# Patient Record
Sex: Female | Born: 1960
Health system: Southern US, Community
[De-identification: ages and names within clinical notes are randomized; demographics above are authoritative.]

## PROBLEM LIST (undated history)

## (undated) DIAGNOSIS — K449 Diaphragmatic hernia without obstruction or gangrene: Secondary | ICD-10-CM

## (undated) DIAGNOSIS — K2980 Duodenitis without bleeding: Secondary | ICD-10-CM

## (undated) DIAGNOSIS — K219 Gastro-esophageal reflux disease without esophagitis: Secondary | ICD-10-CM

## (undated) DIAGNOSIS — D259 Leiomyoma of uterus, unspecified: Secondary | ICD-10-CM

## (undated) DIAGNOSIS — E039 Hypothyroidism, unspecified: Secondary | ICD-10-CM

## (undated) DIAGNOSIS — K222 Esophageal obstruction: Secondary | ICD-10-CM

## (undated) DIAGNOSIS — R87619 Unspecified abnormal cytological findings in specimens from cervix uteri: Secondary | ICD-10-CM

## (undated) DIAGNOSIS — M797 Fibromyalgia: Secondary | ICD-10-CM

## (undated) DIAGNOSIS — D649 Anemia, unspecified: Secondary | ICD-10-CM

## (undated) DIAGNOSIS — I10 Essential (primary) hypertension: Secondary | ICD-10-CM

## (undated) DIAGNOSIS — J189 Pneumonia, unspecified organism: Secondary | ICD-10-CM

## (undated) DIAGNOSIS — E785 Hyperlipidemia, unspecified: Secondary | ICD-10-CM

## (undated) HISTORY — DX: Anemia, unspecified: D64.9

## (undated) HISTORY — DX: Pneumonia, unspecified organism: J18.9

## (undated) HISTORY — DX: Hyperlipidemia, unspecified: E78.5

## (undated) HISTORY — PX: ABDOMINAL HYSTERECTOMY: SHX81

## (undated) HISTORY — DX: Gastro-esophageal reflux disease without esophagitis: K21.9

## (undated) HISTORY — DX: Hypothyroidism, unspecified: E03.9

## (undated) HISTORY — DX: Leiomyoma of uterus, unspecified: D25.9

## (undated) HISTORY — PX: CHOLECYSTECTOMY: SHX55

## (undated) HISTORY — DX: Duodenitis without bleeding: K29.80

## (undated) HISTORY — DX: Unspecified abnormal cytological findings in specimens from cervix uteri: R87.619

## (undated) HISTORY — DX: Essential (primary) hypertension: I10

## (undated) HISTORY — DX: Esophageal obstruction: K22.2

## (undated) HISTORY — PX: TONSILLECTOMY: SUR1361

## (undated) HISTORY — DX: Diaphragmatic hernia without obstruction or gangrene: K44.9

---

## 1989-11-25 HISTORY — PX: TUBAL LIGATION: SHX77

## 2002-12-29 ENCOUNTER — Other Ambulatory Visit: Admission: RE | Admit: 2002-12-29 | Discharge: 2002-12-29 | Payer: Self-pay | Admitting: Family Medicine

## 2003-10-07 ENCOUNTER — Other Ambulatory Visit: Admission: RE | Admit: 2003-10-07 | Discharge: 2003-10-07 | Payer: Self-pay | Admitting: Obstetrics and Gynecology

## 2004-01-02 ENCOUNTER — Other Ambulatory Visit: Admission: RE | Admit: 2004-01-02 | Discharge: 2004-01-02 | Payer: Self-pay | Admitting: Obstetrics and Gynecology

## 2004-02-24 HISTORY — PX: HYSTEROSCOPY W/ ENDOMETRIAL ABLATION: SUR665

## 2004-02-28 ENCOUNTER — Ambulatory Visit (HOSPITAL_BASED_OUTPATIENT_CLINIC_OR_DEPARTMENT_OTHER): Admission: RE | Admit: 2004-02-28 | Discharge: 2004-02-28 | Payer: Self-pay | Admitting: Obstetrics and Gynecology

## 2004-02-28 ENCOUNTER — Ambulatory Visit (HOSPITAL_COMMUNITY): Admission: RE | Admit: 2004-02-28 | Discharge: 2004-02-28 | Payer: Self-pay | Admitting: Obstetrics and Gynecology

## 2005-01-02 ENCOUNTER — Other Ambulatory Visit: Admission: RE | Admit: 2005-01-02 | Discharge: 2005-01-02 | Payer: Self-pay | Admitting: *Deleted

## 2005-02-23 ENCOUNTER — Emergency Department (HOSPITAL_COMMUNITY): Admission: EM | Admit: 2005-02-23 | Discharge: 2005-02-23 | Payer: Self-pay | Admitting: Emergency Medicine

## 2005-05-25 ENCOUNTER — Emergency Department (HOSPITAL_COMMUNITY): Admission: EM | Admit: 2005-05-25 | Discharge: 2005-05-25 | Payer: Self-pay | Admitting: Emergency Medicine

## 2005-07-13 ENCOUNTER — Emergency Department (HOSPITAL_COMMUNITY): Admission: EM | Admit: 2005-07-13 | Discharge: 2005-07-13 | Payer: Self-pay | Admitting: Emergency Medicine

## 2005-07-31 ENCOUNTER — Encounter: Admission: RE | Admit: 2005-07-31 | Discharge: 2005-07-31 | Payer: Self-pay | Admitting: Gastroenterology

## 2005-11-25 DIAGNOSIS — D649 Anemia, unspecified: Secondary | ICD-10-CM

## 2005-11-25 HISTORY — DX: Anemia, unspecified: D64.9

## 2005-11-25 HISTORY — PX: RECTOCELE REPAIR: SHX761

## 2006-01-10 ENCOUNTER — Other Ambulatory Visit: Admission: RE | Admit: 2006-01-10 | Discharge: 2006-01-10 | Payer: Self-pay | Admitting: Obstetrics & Gynecology

## 2006-03-25 ENCOUNTER — Ambulatory Visit (HOSPITAL_COMMUNITY): Admission: RE | Admit: 2006-03-25 | Discharge: 2006-03-26 | Payer: Self-pay | Admitting: Obstetrics & Gynecology

## 2006-03-25 ENCOUNTER — Encounter (INDEPENDENT_AMBULATORY_CARE_PROVIDER_SITE_OTHER): Payer: Self-pay | Admitting: Specialist

## 2006-03-25 HISTORY — PX: TOTAL VAGINAL HYSTERECTOMY: SHX2548

## 2006-09-11 ENCOUNTER — Encounter: Admission: RE | Admit: 2006-09-11 | Discharge: 2006-09-11 | Payer: Self-pay | Admitting: Interventional Cardiology

## 2006-09-12 ENCOUNTER — Inpatient Hospital Stay (HOSPITAL_BASED_OUTPATIENT_CLINIC_OR_DEPARTMENT_OTHER): Admission: RE | Admit: 2006-09-12 | Discharge: 2006-09-12 | Payer: Self-pay | Admitting: Interventional Cardiology

## 2007-08-04 ENCOUNTER — Ambulatory Visit (HOSPITAL_COMMUNITY): Admission: RE | Admit: 2007-08-04 | Discharge: 2007-08-04 | Payer: Self-pay | Admitting: Internal Medicine

## 2008-01-18 ENCOUNTER — Other Ambulatory Visit: Admission: RE | Admit: 2008-01-18 | Discharge: 2008-01-18 | Payer: Self-pay | Admitting: Obstetrics and Gynecology

## 2008-08-22 ENCOUNTER — Telehealth: Payer: Self-pay | Admitting: Gastroenterology

## 2008-09-01 DIAGNOSIS — K219 Gastro-esophageal reflux disease without esophagitis: Secondary | ICD-10-CM | POA: Insufficient documentation

## 2008-09-01 DIAGNOSIS — E039 Hypothyroidism, unspecified: Secondary | ICD-10-CM

## 2008-09-01 DIAGNOSIS — D259 Leiomyoma of uterus, unspecified: Secondary | ICD-10-CM | POA: Insufficient documentation

## 2008-09-06 ENCOUNTER — Ambulatory Visit: Payer: Self-pay | Admitting: Internal Medicine

## 2008-09-06 DIAGNOSIS — R51 Headache: Secondary | ICD-10-CM

## 2008-09-06 DIAGNOSIS — R519 Headache, unspecified: Secondary | ICD-10-CM | POA: Insufficient documentation

## 2008-09-06 DIAGNOSIS — N390 Urinary tract infection, site not specified: Secondary | ICD-10-CM | POA: Insufficient documentation

## 2008-09-06 DIAGNOSIS — IMO0001 Reserved for inherently not codable concepts without codable children: Secondary | ICD-10-CM

## 2008-09-06 DIAGNOSIS — E785 Hyperlipidemia, unspecified: Secondary | ICD-10-CM | POA: Insufficient documentation

## 2008-09-07 ENCOUNTER — Ambulatory Visit: Payer: Self-pay | Admitting: Internal Medicine

## 2008-09-07 LAB — CONVERTED CEMR LAB: UREASE: NEGATIVE

## 2009-04-18 ENCOUNTER — Telehealth: Payer: Self-pay | Admitting: Internal Medicine

## 2009-04-28 ENCOUNTER — Ambulatory Visit: Payer: Self-pay | Admitting: Internal Medicine

## 2009-10-31 ENCOUNTER — Telehealth: Payer: Self-pay | Admitting: Internal Medicine

## 2010-04-25 ENCOUNTER — Encounter: Payer: Self-pay | Admitting: Internal Medicine

## 2010-12-04 ENCOUNTER — Ambulatory Visit (HOSPITAL_COMMUNITY)
Admission: RE | Admit: 2010-12-04 | Discharge: 2010-12-04 | Payer: Self-pay | Source: Home / Self Care | Attending: Internal Medicine | Admitting: Internal Medicine

## 2010-12-16 ENCOUNTER — Encounter: Payer: Self-pay | Admitting: Internal Medicine

## 2010-12-25 NOTE — Medication Information (Signed)
Summary: Omeprazole/[facility]  Omeprazole/[facility]   Imported By: Sherian Rein 04/30/2010 11:45:36  _____________________________________________________________________  External Attachment:    Type:   Image     Comment:   External Document

## 2011-04-12 NOTE — Op Note (Signed)
NAME:  [name], [name] NO.:  [id number]   MEDICAL RECORD NO.:  [id number]          PATIENT TYPE:  OIB   LOCATION:  9316                          FACILITY:  [facility]   PHYSICIAN:  Martina Sinner, MD DATE OF BIRTH:  December 28, 1960   DATE OF PROCEDURE:  03/25/2006  DATE OF DISCHARGE:                                 OPERATIVE REPORT   SURGEON:  Martina Sinner, M.D.   ASSISTANT:  Lum Keas, M.D.   PREOPERATIVE DIAGNOSIS:  Cystocele, rectocele.   POSTOPERATIVE DIAGNOSIS:  Rectocele.   OPERATION:  Rectocele repair.   Ms. [name] was seen in clinic with asymptomatic cystocele and  rectocele.  Based upon her age and size of the defect, it was felt that it  was in her best interest to repair these at the time of her hysterectomy.   Ms. [name] underwent a transvaginal hysterectomy of a very large uterus by  Dr. Leda Quail.  The cuff was re-supported to the uterosacral ligaments.  On exam of the patient, her anterior vaginal wall and cystocele was nicely  reduced.  She did not require cystocele repair.  She did have some bulging  over the central defect posteriorly.  I did a digital rectal examination by  double gloving the left hand and of note, Dr. Hyacinth Meeker and I, felt that she  would benefit from a rectocele repair.   I initially removed a small diamond of perineal skin.  I made an incision  between Allis clamps with Metzenbaum scissors along the posterior midline to  approximately 2 cm from the posterior cuff which was closed.  I mobilized  the vaginal mucosa from the rectovaginal fascia bilaterally.  A double ring  retractor was utilized.  She had a lot of bleeding throughout the rectocele  repair.  A tonsil had to be used on one bleeder that was oversewn with  figure-of-eight 2-0 Vicryl.  On the right pararectal area, I had to oversew  two veins with 2-0 Vicryl on a CT wand.  I did a one layer closure of the  rectovaginal fascia in the midline.  The  rectovaginal fascia was actually  quite flat after taking down the vaginal mucosa.  There was no obvious site  defect.  A moderate amount of fulguration was utilized to control  hemostasis.  Blood loss during the procedure was approximately 500 mL.  I  trimmed approximately 4-5 mm of vaginal mucosa bilaterally and again the  vaginal mucosa bled.  This was controlled with cautery but also with running  locking 2-0 Vicryl suture.  I used one 0 Vicryl to gently reapproximate the  perineal body.  I then utilized my 2-0 Vicryl to close the perineal diamond  of skin with a subcuticular suture.  Some manual compression was utilized to  control any residual bleeding.  Two 1 inch gauze packs were utilized with  Premarin cream to pack the vagina tightly.  Both Dr. Hyacinth Meeker and I felt that  the vaginal packs would control nicely any residual oozing of blood.   Total estimated blood loss throughout the  case was approximately 700 to 800  mL.  She was given 3 liters of crystalloid.  We are going to watch her CBC  postoperatively as well as tomorrow morning.  Her hemoglobin some  preoperatively was 14.1.  A digital rectal examination was done at the end  of the case and there were no stitches in the bladder and there was no  stitch within the rectum or blood in the rectum.           ______________________________  Martina Sinner, MD  Electronically Signed     SAM/MEDQ  D:  03/25/2006  T:  03/25/2006  Job:  [id number]

## 2011-04-12 NOTE — Op Note (Signed)
NAME:  [name], [name]                        ACCOUNT NO.:  [id number]   MEDICAL RECORD NO.:  [id number]                   PATIENT TYPE:  AMB   LOCATION:  [facility]                                 FACILITY:  [facility]   PHYSICIAN:  Laqueta Linden, M.D.                 DATE OF BIRTH:  1961-01-27   DATE OF PROCEDURE:  02/28/2004  DATE OF DISCHARGE:                                 OPERATIVE REPORT   PREOPERATIVE DIAGNOSES:  Menorrhagia with anemia.   POSTOPERATIVE DIAGNOSES:  Menorrhagia with anemia.   PROCEDURE:  Hydrothermal ablation.   SURGEON:  Laqueta Linden, M.D.   ANESTHESIA:  General LMA.   ESTIMATED BLOOD LOSS:  Negligible.   SORBITOL NET INTAKE:  Negligible.   COMPLICATIONS:  None.   INDICATIONS FOR PROCEDURE:  [name] is a 50 year old, gravida 3, para  3 female who presents with disabling menorrhagia with anemia and a  hemoglobin of 9.3.  She was noted on exam to have a large fundal fibroid.  Pelvic ultrasound with sonohysterogram confirmed a 6 cm fundal fibroid that  appeared to be intramural in location. Both ovaries appeared normal. There  were no other fibroids or lesions noted.  The endometrial cavity was free of  any lesions including fibroids or polyps.  Endometrial sampling revealed  benign proliferative endometrium. Various management options were discussed  with the patient including medical management with oral contraceptive  progesterone only pills or a Marina IUD versus surgical options including  the ablation versus vaginal hysterectomy. The patient is getting married in  a couple of months and wanted the most conservative surgical procedure and  therefore elected to proceed to ablation.  She has seen the informed consent  film, voiced her understanding of the risks, benefits, alternatives,  complications and limitations of the procedure as well as the likelihood  that she will not obtain optimal relief of her symptoms by the time of her  wedding in two  months time.  She understands the risk of uterine  perforation, bleeding, incomplete results, burn of the vagina and cervix as  __________ and agrees to proceed.   DESCRIPTION OF PROCEDURE:  The patient was taken to the operating room and  after proper identification and consents were ascertained, she was placed on  the operating table in the supine position.  After the induction of general  LMA, she was placed in the [place] stirrups and the perineum and vagina were  prepped and draped in routine sterile fashion.  The bladder was emptied with  a red rubber catheter.  Bimanual examination confirmed an anterior mobile  uterus with a large fundal fibroid. A speculum was placed in the vagina and  the cervix was grasped with a single tooth tenaculum. The internal os was  gently dilated to a #21 Pratt dilator. The short scope was then inserted  with continuous closed circuit infusion of sorbitol. The uterine cavity  appeared  clean and atrophic. There were no focal lesions, both tubal ostia  were visualized.  The hydrothermal ablation was then accomplished in a  routine fashion with checking for appropriate pressure levels and assessing  for any perforations or leak, heating the fluid and then an ablation  procedure of 10 minutes followed by cool down. This was all accomplished  without difficulty. There was no obvious leakage either on the monitor or by  direct visualization of the cervix.  Photographs of pre and postoperative  findings were then taken. The scope was withdrawn. The tenaculum site was  hemostatic. There was no bleeding from the cervix.  The patient received 30  mg of Toradol IV, 30 mg IM at the onset of the procedure. She will be  observed and discharged per anesthesia protocol.  She will be discharged  home with family and told to take Advil or Aleve as needed for cramping. She  is to followup in the office in 2-3 weeks time or sooner for excessive pain,  fever, bleeding or other  concerns.                                               Laqueta Linden, M.D.    LKS/MEDQ  D:  02/28/2004  T:  02/28/2004  Job:  [id number]

## 2011-04-12 NOTE — Discharge Summary (Signed)
NAME:  [name], [name]                 ACCOUNT NO.:  [id number]   MEDICAL RECORD NO.:  [id number]          PATIENT TYPE:  OIB   LOCATION:  9316                          FACILITY:  [facility]   PHYSICIAN:  M. Leda Quail, MD  DATE OF BIRTH:  1961/11/06   DATE OF ADMISSION:  03/25/2006  DATE OF DISCHARGE:  03/26/2006                                 DISCHARGE SUMMARY   ADMISSION DIAGNOSES:  28.  A 50 year old, G3, P3, married, white female with menorrhagia.  2.  Failed hydrothermal ablation.  3.  Fibroid uterus which is enlarging on ultrasound.  4.  History of right bundle branch block.  5.  Reflux disease.  6.  Hypothyroidism.  7.  Pelvic relaxation.   DISCHARGE DIAGNOSES:  42.  A 50 year old, G3, P3, married, white female with menorrhagia.  2.  Failed hydrothermal ablation.  3.  Fibroid uterus which is enlarging on ultrasound.  4.  History of right bundle branch block.  5.  Reflux disease.  6.  Hypothyroidism.  7.  Pelvic relaxation.   PROCEDURE:  Transvaginal hysterectomy with a posterior repair performed by  Dr. Sherron Monday of urology.   HISTORY OF PRESENT ILLNESS:  Written H&P is in the chart, but in brief, Ms.  [name] is a 50 year old, G3, P3, married, white female with menorrhagia who  has a fibroid uterus and HTA in April 2005.  She did have improvement in her  bleeding, but ultimately, the fibroids continued to grow and she has had  worsening bleeding.  Her cycles are heavy with clots.  She has had a history  of associated iron deficiency anemia, but this is normal with the iron  supplementation and multivitamin.   PHYSICAL EXAMINATION:  GENITALIA:  Significant rectocele and cystocele.  She  has seen Dr. Alfredo Martinez.  She does not have any evidence of stress  urinary incontinence.  As a result, possible anterior and posterior repair  has been posted by Dr. Sherron Monday.   HOSPITAL COURSE:  The patient was admitted through same day surgery.  She  was taken to the operating  room where a TVH and rectocele repair was  performed.  The patient did have approximately 700 mL of blood loss during  this surgery, 300 of this was during the [facility] and the remainder was during  the posterior repair.  The patient did tolerate the procedure well and she  was very stable throughout the procedure.  She was taken to the recovery  room after her surgery and then to the third floor for her postop course.  In the evening of postop day #0, she is awake and alert.  She was talking  and had good pain control.  She was not nauseated and was tolerating a  regular diet.  Her postop hemoglobin was checked in the morning of postop  day #1 and this was appropriate decrease from surgery.  It was 9.9 which was  felt appropriate based on her blood loss.  She had stable vital signs  throughout her  hospitalization.  She was afebrile.  The morning of day #1, she was  voiding  without difficulty, ambulating with excellent pain control and tolerating a  regular diet.  Discharge was felt appropriate.  She was discharged home in  stable condition with her husband.      Lum Keas, MD  Electronically Signed     MSM/MEDQ  D:  04/15/2006  T:  04/15/2006  Job:  [id number]

## 2011-04-12 NOTE — Cardiovascular Report (Signed)
NAME:  [name], HERST NO.:  [id number]   MEDICAL RECORD NO.:  [id number]          PATIENT TYPE:  OIB   LOCATION:  1963                         FACILITY:  [facility]   PHYSICIAN:  Lyn Records, M.D.   DATE OF BIRTH:  [birth year]   DATE OF PROCEDURE:  DATE OF DISCHARGE:                              CARDIAC CATHETERIZATION   INDICATIONS:  The patient has an abnormal EKG with a right bundle branch  block and has had recurring episodes of chest discomfort over the last year  or so.  Chest discomfort has changed in quality and has taken on a more  pressure-like discomfort.  A Cardiolite study done a year and half ago was  normal.  This study is being done to define coronary anatomy and rule out  the possibility of matched ischemia.   PROCEDURE PERFORMED:  1. Left heart cath.  2. Selective coronary angio.  3. Left ventriculography.   DESCRIPTION:  After informed consent, a 4-French sheath was placed in the  right femoral artery using the modified Seldinger technique.  A 4-French A2  multipurpose catheter was then used for hemodynamic recordings, and left  ventriculography by hand injection.  We then used a 4-French, #4 left  Judkins, and #4 right Judkins catheters for selective coronary angiography.  Intracoronary nitroglycerin 200 mcg was administered into the left main.  Following completion of angiography, the case was terminated.  The cine  digital images were reviewed with the patient and the sheath was removed to  achieve hemostasis by manual compression.  No complications occurred.   RESULTS:  1. Hemodynamic data:      a.     Aortic pressure 127/82-mmHg.      b.     Left ventricular pressure 127/-1-mmHg.  2. Left ventriculography:  The left ventricle is normal in size and      demonstrates normal contractility.  EF 65% or minimal.  3. Coronary angiography:      a.     Left main coronary:  Widely patent.      b.     The left anterior descending coronary:   Minimal luminal       irregularities are noted in the proximal vessel and the first septal       perforator.  The vessels are widely patent..      c.     The circumflex artery:  Normal, two small distal obtuse marginal       branches arise from it.      d.     The ramus intermedius branch:  A ramus intermedius branch arises       from the distal left main and is also free of any significant       obstruction.Marland Kitchen      e.     The right coronary:  The right coronary artery is a large,       dominant vessel, very smooth without any evidence of irregularity.  A       large PDA and two left ventricular branches arise from it distally.   CONCLUSIONS:  1. Essentially normal coronary arteries.  2. Normal left ventricular function.  3. Chest pain not likely to be related to myocardial ischemia.   RECOMMENDATIONS:  Pursue other explanations for chest discomfort, continue  aggressive risk factor modification.      Lyn Records, M.D.  Electronically Signed     HWS/MEDQ  D:  09/12/2006  T:  09/13/2006  Job:  [id number]   cc:   Lovenia Kim, D.O.

## 2011-04-12 NOTE — Op Note (Signed)
NAME:  [name], [name]                 ACCOUNT NO.:  [id number]   MEDICAL RECORD NO.:  [id number]          PATIENT TYPE:  OIB   LOCATION:  9316                          FACILITY:  [facility]   PHYSICIAN:  M. Leda Quail, MD  DATE OF BIRTH:  [birth year]   DATE OF PROCEDURE:  03/25/2006  DATE OF DISCHARGE:                                 OPERATIVE REPORT   PREOPERATIVE DIAGNOSES:  37.  A 50 year old G3 P3 married white female with menorrhagia.  2.  Fibroid uterus, with an enlarging fibroid on ultrasound measuring 6.3 x      7.4 cm.  3.  History of hydrothermal ablation which has failed.  4.  History of hypothyroidism.  5.  Reflux.  6.  Cystocele and rectocele.   POSTOPERATIVE DIAGNOSES:  35.  A 50 year old G3 P3 married white female with menorrhagia.  2.  Fibroid uterus, with an enlarging fibroid on ultrasound measuring 6.3 x      7.4 cm.  3.  History of hydrothermal ablation which has failed.  4.  History of hypothyroidism.  5.  Reflux.  6.  Cystocele and rectocele.   PROCEDURE:  TVH and a posterior repair performed.  The second part of the  procedure was performed by Dr. Alfredo Martinez.   SURGEON:  M. Leda Quail, M.D.   ASSISTANT:  Edwena Felty. Romine, M.D.   FLUIDS:  3000 cc of LR.   URINE OUTPUT:  220 cc of clear urine.   ESTIMATED BLOOD LOSS:  700 cc, approximately 350 for the vaginal  hysterectomy, and 350 for the posterior repair.   SPECIMENS:  Uterus and cervix.   ANESTHESIA:  General endotracheal anesthesia.   COMPLICATIONS:  None for the vaginal hysterectomy portion of the procedure.   INDICATIONS:  [name] is a very pleasant 50 year old G3 P3 married white  female with menorrhagia and a fibroid uterus.  She does have a history of  fibroids, and an ultrasound was done earlier this year which showed a much  enlarged fundal fibroid.  This measured 6.3 x 7.4 cm.  She did have an HTA  in 1995 with initial success but her bleeding has worsened over the last  year.   She has difficulty with oral contraceptives.  She and I discussed  either myomectomy or proceeding with definitive management with  hysterectomy, and she has opted for hysterectomy.   PROCEDURE:  The patient was taken to the operating room.  She was placed in  the supine position.  General endotracheal anesthesia was administered by  the anesthesia staff without difficulty.  Legs were positioned initially in  the low lithotomy position in the [place] stirrups.  The Allen stirrups were  raised to the high lithotomy position.  Abdomen, perineum, inner thighs, and  vagina were prepped and draped in the normal sterile fashion.  Red rubber  Foley catheter proceeded to drain the bladder of all urine.   A short heavy weighted speculum was placed in the posterior aspect of the  vagina.  The cervix was grasped with a Jackson tenaculum.  The posterior cul-  de-sac was  entered sharply and without difficulty.  A figure-of-eight suture  was placed through the posterior peritoneum and incorporated the posterior  vaginal mucosa.  A long heavy weighted retractor was then placed through  this incision.  The cervix at this point was instilled with 10 cc of 1%  lidocaine mixed 1:1 with epinephrine (1:100,000 units).  The cervix was  circumferentially incised with cautery.  Bovie on a 35/35 cut/coag setting  was used.  At this point, the uterosacral ligaments on the left and right  side were clamped.  These were cut and stitched with Heaney stitches.  The  sutures were left long.  Attention was turned anteriorly.  The  pubovesicocervical fascia was dissected sharply with Metzenbaum scissors.  Then, with blunt dissection of an open Ray-Tec, the bladder was advanced  superiorly on the cervix.  At this point, the beginning of the cardinal  ligaments were clamped, transected, and suture ligated with #0 Vicryl on  both the left and right sides.  Attention was then turned anteriorly.  Again, the pubovesicocervical  fascia was dissected further.  The reflection  of the anterior peritoneum was noted.  This was incised sharply with  Metzenbaum scissors.  Curved Dever retractor was placed in this incision and  bowel visualized to insure the operator was in the correct pla.  Then the  Cardinal ligaments and uterine arteries were serially clamped, transected,  and suture ligated with #0 Vicryl.  Clamps were placed medial to previous  clamps.  This was performed on the left and the right side up the cervix to  the level of the internal os of cervix.  At this point, it was difficult to  do any further clamping on the patient's left side due to the location of  the fibroid and the position of the uterus.  However, further dissection up  the broad ligament on the right side could be performed.  Two more clamps  were placed which were serially transected and suture ligated with #0  Vicryl.  At this point, the operator's index finger could almost be placed  around the uteroovarian ligament.  An attempt was made to deliver the  posterior aspect of the uterus through the incision, but due to the fibroid  this was impossible.  At this point, the decision was made to go ahead and  morcellize the uterus.  The cervix was cored from the midline around to the  patient's left and then posteriorly, and then around to the patient's right.  Once the cervix was cored free, several smaller fibroids could be removed  from inside the myometrium.  A wedge of the uterus posteriorly was then  removed with a blade with a long handle.  At this point, the right aspect of  the uterus could be pulled further into the visual field, and the right  uteroovarian ligament could be clamped with a Heaney clamp.  This was  clamped fully.  This pedicle was transected, suture ligated first with a  free tie of #0 Vicryl, and second with a Heaney suture of #0 Vicryl.  The suture was left long and grasped with a curved hemostat.  At this point, the   location of the fibroid was noted.  The uterus was incised until the fibroid  was noted.  The fibroid was then cored out by cutting in the capsule.  Once  the large fundal fibroid was removed, the uterus could be delivered through  the incision.  The uterus was then clamped on  the left side, across the  uteroovarian ligament with a Heaney clamped.  The uterus was then transected  off.  Two free ties of #0 Vicryl were placed around this pedicle, and then a  Heaney suture of #0 Vicryl was placed.  This pedicle was hemostatic.  It was  kept long and put in a curved hemostat.  At this point, the pedicles were  inspected, particularly the uteroovarian bilaterally.  It was very difficult  to pull the ovaries any farther into the visual field.  The patient and I  had discussed possibly removing one of the ovaries.  However, because of the  difficulty, the decision was made to go ahead and leave it in place.  It  could be seen and did appear normal.  At this point, the cardinal ligaments  and the uterine artery pedicles were inspected.  No bleeding was noted.  There was some bleeding off of the posterior cuff of the vaginal mucosa.  The long heavy weighted retractor was removed, and a short weighted one was  placed in the posterior aspect of the vagina.  At this point, the cuff was  run with the #0 Vicryl.  This incorporated the anterior and posterior  peritoneum in this closure.  Then, another long #0 Vicryl was placed through  the posterior vaginal mucosa to placed an enterocele stitch.  This stitch  was placed through the posterior vaginal mucosa and brought out through the  posterior peritoneum.  Then, the medial third of the uterosacral ligament  was incorporated.  The posterior peritoneum was wreathed across and  incorporated into this stitch, and then finally the opposite uterosacral  ligament was incorporated.  The medial third of this ligament was  incorporated, and this stitch was brought  back to the posterior vaginal  peritoneum and mucosa.  The stitch was tied tight, and the uterosacral  ligaments were brought together.  At this point, the remainder of the  vaginal cuff was closed with four interrupted sutures of #0 Vicryl.  Figure-  of-eight sutures were used.  The vagina was irrigated.  No bleeding was  noted at this point.  There was approximately 350 cc of blood loss from this  portion of the procedure, and a Foley catheter was placed in the bladder and  clamped, and approximately 80 cc of clear urine was noted.  Sponge, lap,  needle, and instrument counts were correct at this point in the procedure.  Dr. Sherron Monday was available at this time, and the remainder of the  dictation will be performed by him.      Lum Keas, MD  Electronically Signed     MSM/MEDQ  D:  03/25/2006  T:  03/25/2006  Job:  [id number]

## 2011-08-25 ENCOUNTER — Other Ambulatory Visit: Payer: Self-pay | Admitting: Internal Medicine

## 2011-11-13 ENCOUNTER — Other Ambulatory Visit: Payer: Self-pay | Admitting: Internal Medicine

## 2011-11-13 DIAGNOSIS — R109 Unspecified abdominal pain: Secondary | ICD-10-CM

## 2011-11-13 DIAGNOSIS — M4800 Spinal stenosis, site unspecified: Secondary | ICD-10-CM

## 2011-11-20 ENCOUNTER — Other Ambulatory Visit: Payer: Self-pay

## 2011-11-20 ENCOUNTER — Ambulatory Visit
Admission: RE | Admit: 2011-11-20 | Discharge: 2011-11-20 | Disposition: A | Discharge status: Home / Self Care | Payer: 59 | Source: Ambulatory Visit | Attending: Internal Medicine | Admitting: Internal Medicine

## 2011-11-20 DIAGNOSIS — M4800 Spinal stenosis, site unspecified: Secondary | ICD-10-CM

## 2011-11-21 ENCOUNTER — Ambulatory Visit
Admission: RE | Admit: 2011-11-21 | Discharge: 2011-11-21 | Disposition: A | Discharge status: Home / Self Care | Payer: 59 | Source: Ambulatory Visit | Attending: Internal Medicine | Admitting: Internal Medicine

## 2011-11-21 DIAGNOSIS — R109 Unspecified abdominal pain: Secondary | ICD-10-CM

## 2011-12-04 ENCOUNTER — Other Ambulatory Visit: Payer: Self-pay | Admitting: Internal Medicine

## 2011-12-10 ENCOUNTER — Ambulatory Visit
Admission: RE | Admit: 2011-12-10 | Discharge: 2011-12-10 | Disposition: A | Discharge status: Home / Self Care | Payer: 59 | Source: Ambulatory Visit | Attending: Internal Medicine | Admitting: Internal Medicine

## 2011-12-25 ENCOUNTER — Other Ambulatory Visit (INDEPENDENT_AMBULATORY_CARE_PROVIDER_SITE_OTHER): Payer: 59

## 2011-12-25 ENCOUNTER — Ambulatory Visit (INDEPENDENT_AMBULATORY_CARE_PROVIDER_SITE_OTHER): Payer: 59 | Admitting: Internal Medicine

## 2011-12-25 ENCOUNTER — Encounter: Payer: Self-pay | Admitting: Internal Medicine

## 2011-12-25 ENCOUNTER — Telehealth: Payer: Self-pay

## 2011-12-25 VITALS — BP 132/64 | HR 80 | Ht 65.0 in | Wt 198.0 lb

## 2011-12-25 DIAGNOSIS — R1011 Right upper quadrant pain: Secondary | ICD-10-CM

## 2011-12-25 DIAGNOSIS — Z1211 Encounter for screening for malignant neoplasm of colon: Secondary | ICD-10-CM

## 2011-12-25 DIAGNOSIS — K219 Gastro-esophageal reflux disease without esophagitis: Secondary | ICD-10-CM

## 2011-12-25 LAB — HEPATIC FUNCTION PANEL
ALT: 33 U/L (ref 0–35)
AST: 30 U/L (ref 0–37)
Bilirubin, Direct: 0.1 mg/dL (ref 0.0–0.3)
Total Bilirubin: 0.3 mg/dL (ref 0.3–1.2)

## 2011-12-25 LAB — LIPASE: Lipase: 25 U/L (ref 11.0–59.0)

## 2011-12-25 LAB — AMYLASE: Amylase: 44 U/L (ref 27–131)

## 2011-12-25 MED ORDER — PEG-KCL-NACL-NASULF-NA ASC-C 100 G PO SOLR: 1.0000 | Freq: Once | ORAL | Status: DC

## 2011-12-25 NOTE — Progress Notes (Signed)
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 She has been seen in this office previously for GERD. The patient reports a greater than one-year history of upper abdominal pressure discomfort. This is most noticeable in the right though can occur across the entire upper abdomen. The discomfort is not constant or severe. It seems to be exacerbated by meals on occasion. No nocturnal component. She states it reminds her of "gallbladder pain" though she is status post remote cholecystectomy. Review of outside radiology findings abdominal ultrasound from 11/21/2011 with no abnormalities post cholecystectomy. Subsequent CT scan of the abdomen and pelvis, without contrast, dated 12/10/2011 was unremarkable. No nausea or vomiting. Steady weight gain of 13 pounds since her last office visit in 2010. No problems with her bowels. No GI bleeding. The discomfort is also exacerbated with sitting or bending forward and improved with standing or leaning backward. Reflux symptoms are under good control except with dietary indiscretion. She continues on twice a day omeprazole 40 mg. No dysphagia. She inquires about screening colonoscopy, which she is due this year.  REVIEW OF SYSTEMS:  All non-GI ROS negative except for insomnia and urinary leakage  Past Medical History  Diagnosis Date  . Hypothyroidism   . GERD (gastroesophageal reflux disease)   . Leiomyoma of uterus, unspecified     fibroids  . Esophagitis   . Hiatal hernia   . Esophageal stricture   . Duodenitis without hemorrhage     Past Surgical History  Procedure Date  . Abdominal hysterectomy   . Cholecystectomy   . Rectocele repair     Social History GARLAND SMOUSE  reports that she quit smoking about 9 years ago. She has never used smokeless tobacco. She reports that she  does not drink alcohol or use illicit drugs.  family history includes Breast cancer in an unspecified family member; Celiac disease in her mother; Heart disease in her father; and Irritable bowel syndrome in her sister.  There is no history of Colon cancer.  No Known Allergies     PHYSICAL EXAMINATION: Vital signs: BP 132/64  Pulse 80  Ht 5\' 5"  (1.651 m)  Wt 198 lb (89.812 kg)  BMI 32.95 kg/m2  Constitutional: generally well-appearing, no acute distress Psychiatric: alert and oriented x3, cooperative Eyes: extraocular movements intact, anicteric, conjunctiva pink Mouth: oral pharynx moist, no lesions Neck: supple no lymphadenopathy Cardiovascular: heart regular rate and rhythm, no murmur Lungs: clear to auscultation bilaterally Abdomen: soft, nontender, nondistended, no obvious ascites, no peritoneal signs, normal bowel sounds, no organomegaly Rectal:deferred until colonoscopy Extremities: no lower extremity edema bilaterally Skin: no lesions on visible extremities Neuro: No focal deficits. No asterixis.     ASSESSMENT:  #1. Greater than one-year history of upper abdominal discomfort as described. No radiologic abnormalities. Possibly musculoskeletal. Less likely common duct stone. Rule out upper GI mucosal lesion. #2. GERD. Chronic. Fairly well controlled with twice a day PPI. Breakthrough with dietary indiscretion #3. Screening colonoscopy. Appropriate candidate without contraindication  PLAN:  #1. Reflux precautions #2. Continue PPI #3. Diagnostic upper endoscopy.The nature of the procedure, as well as the risks, benefits, and alternatives were carefully and thoroughly reviewed with the patient. Ample time for discussion and questions allowed. The patient understood, was satisfied, and agreed to proceed.  #4. Her function tests, amylase, lipase #5. Screening colonoscopy.The nature of the procedure, as  well as the risks, benefits, and alternatives were carefully and  thoroughly reviewed with the patient. Ample time for discussion and questions allowed. The patient understood, was satisfied, and agreed to proceed. Movi prep prescribed. The patient instructed on its use. We will perform the examination the same day for upper endoscopy, for her convenience

## 2011-12-25 NOTE — Telephone Encounter (Signed)
Message copied by Michele Mcalpine on Wed Dec 25, 2011  4:55 PM ------      Message from: Hilarie Fredrickson      Created: Wed Dec 25, 2011  2:24 PM       Let pt know that her blood work is normal. Keep other plans

## 2011-12-25 NOTE — Patient Instructions (Signed)
Your physician has requested that you go to the basement for  lab work before leaving today   You have been scheduled for an endoscopy and colonoscopy with propofol. Please follow the written instructions given to you at your visit today. Please pick up yourprep at the pharmacy within the next 2-3 days.

## 2011-12-26 NOTE — Telephone Encounter (Signed)
Pt aware.

## 2012-01-09 ENCOUNTER — Ambulatory Visit (AMBULATORY_SURGERY_CENTER): Payer: 59 | Admitting: Internal Medicine

## 2012-01-09 ENCOUNTER — Encounter: Payer: Self-pay | Admitting: Internal Medicine

## 2012-01-09 VITALS — BP 130/94 | HR 91 | Temp 98.0°F | Resp 19 | Ht 65.0 in | Wt 198.0 lb

## 2012-01-09 DIAGNOSIS — D126 Benign neoplasm of colon, unspecified: Secondary | ICD-10-CM

## 2012-01-09 DIAGNOSIS — R1011 Right upper quadrant pain: Secondary | ICD-10-CM

## 2012-01-09 DIAGNOSIS — K219 Gastro-esophageal reflux disease without esophagitis: Secondary | ICD-10-CM

## 2012-01-09 DIAGNOSIS — Z1211 Encounter for screening for malignant neoplasm of colon: Secondary | ICD-10-CM

## 2012-01-09 MED ORDER — SODIUM CHLORIDE 0.9 % IV SOLN: 500.0000 mL | INTRAVENOUS | Status: DC

## 2012-01-09 NOTE — Patient Instructions (Signed)
Discharge instructions given with verbal understanding. Handouts on polyps,hiatal hernia and gerd given. Resume previous medications.

## 2012-01-09 NOTE — Op Note (Signed)
 [facility] [address] [place], [place]  [address]  COLONOSCOPY PROCEDURE REPORT  PATIENT:  [name], [name]  MR#:  [id number] BIRTHDATE:  May 03, 1961, 50 yrs. old  GENDER:  female ENDOSCOPIST:  Wilhemina Bonito. Eda Keys, MD REF. BY:  Office PROCEDURE DATE:  01/09/2012 PROCEDURE:  Colonoscopy with snare polypectomy x 1 ASA CLASS:  Class II INDICATIONS:  Routine Risk Screening MEDICATIONS:   MAC sedation, administered by CRNA, propofol (Diprivan) 400 mg IV  DESCRIPTION OF PROCEDURE:   After the risks benefits and alternatives of the procedure were thoroughly explained, informed consent was obtained.  Digital rectal exam was performed and revealed no abnormalities.   The LB 180AL [id number] endoscope was introduced through the anus and advanced to the cecum, which was identified by both the appendix and ileocecal valve, without limitations.  The quality of the prep was excellent, using MoviPrep.  The instrument was then slowly withdrawn as the colon was fully examined. <<PROCEDUREIMAGES>>  FINDINGS:  A 5mm sessile polyp was found in the proximal transverse colon and snared without cautery. Retrieval was successful.  Otherwise normal colonoscopy without other polyps, masses, vascular ectasias, or inflammatory changes.   Retroflexed views in the rectum revealed internal hemorrhoids.    The time to cecum = 2:30  minutes. The scope was then withdrawn in 12:03 minutes from the cecum and the procedure completed.  COMPLICATIONS:  None  ENDOSCOPIC IMPRESSION: 1) Sessile polyp in the proximal transverse colon - removed 2) Otherwise normal colonoscopy  RECOMMENDATIONS: 1) Repeat colonoscopy in 5 years if polyp adenomatous; otherwise 10 years 2) Upper endoscopy  today  ______________________________ Wilhemina Bonito. Eda Keys, MD  CC:  Rodrigo Ran, MD;  The Patient  n. eSIGNED:   Wilhemina Bonito. Eda Keys at 01/09/2012 08:51 AM  Midge Aver, [phone]

## 2012-01-09 NOTE — Progress Notes (Signed)
Patient did not experience any of the following events: a burn prior to discharge; a fall within the facility; wrong site/side/patient/procedure/implant event; or a hospital transfer or hospital admission upon discharge from the facility. (G8907) Patient did not have preoperative order for IV antibiotic SSI prophylaxis. (G8918)  

## 2012-01-09 NOTE — Op Note (Signed)
[facility] [address] [place], [place]  [address]  ENDOSCOPY PROCEDURE REPORT  PATIENT:  Brooke, [name]  MR#:  [id number] BIRTHDATE:  1960-12-10, 50 yrs. old  GENDER:  female  ENDOSCOPIST:  Wilhemina Bonito. Eda Keys, MD Referred by:  Office  PROCEDURE DATE:  01/09/2012 PROCEDURE:  EGD, diagnostic 93235 ASA CLASS:  Class II INDICATIONS:  abdominal pain, upper and right upper quadrant  MEDICATIONS:   MAC sedation, administered by CRNA, propofol (Diprivan) 120 mg IV TOPICAL ANESTHETIC:  none  DESCRIPTION OF PROCEDURE:   After the risks benefits and alternatives of the procedure were thoroughly explained, informed consent was obtained.  The LB GIF-H180 [id number] endoscope was introduced through the mouth and advanced to the second portion of the duodenum, without limitations.  The instrument was slowly withdrawn as the mucosa was fully examined. <<PROCEDUREIMAGES>>  The upper, middle, and distal third of the esophagus were carefully inspected and no abnormalities were noted. The z-line was well seen at the GEJ. The endoscope was pushed into the fundus which was normal including a retroflexed view. The antrum,gastric body, first and second part of the duodenum were unremarkable. Retroflexed views revealed a hiatal hernia.    The scope was then withdrawn from the patient and the procedure completed.  COMPLICATIONS:  None  ENDOSCOPIC IMPRESSION: 1) Normal EGD 2) A hiatal hernia 3) GERD 4) NO GI CAUSE FOR PAIN FOUND OR SUSPECTED. PAIN IS LIKELY MUSCULOSKELETAL  RECOMMENDATIONS: 1) Continue omeprazole to control reflux symptoms  ______________________________ Wilhemina Bonito. Eda Keys, MD  CC:  Rodrigo Ran, MD;  The Patient  n. eSIGNED:   Wilhemina Bonito. Eda Keys at 01/09/2012 09:00 AM  Midge Aver, [phone]

## 2012-01-10 ENCOUNTER — Telehealth: Payer: Self-pay | Admitting: *Deleted

## 2012-01-10 NOTE — Telephone Encounter (Signed)
  Follow up Call-  Call back number 01/09/2012  Post procedure Call Back phone  # [phone]  Permission to leave phone message Yes     Patient questions:  Do you have a fever, pain , or abdominal swelling? no Pain Score  0 *  Have you tolerated food without any problems? yes  Have you been able to return to your normal activities? yes  Do you have any questions about your discharge instructions: Diet   no Medications  no Follow up visit  no  Do you have questions or concerns about your Care? no  Actions: * If pain score is 4 or above: No action needed, pain <4.

## 2012-01-14 ENCOUNTER — Encounter: Payer: Self-pay | Admitting: Internal Medicine

## 2012-05-18 ENCOUNTER — Other Ambulatory Visit: Payer: Self-pay | Admitting: Internal Medicine

## 2012-07-14 LAB — HM MAMMOGRAPHY

## 2012-09-14 ENCOUNTER — Other Ambulatory Visit: Payer: Self-pay

## 2012-09-14 MED ORDER — OMEPRAZOLE 40 MG PO CPDR: 40.0000 mg | DELAYED_RELEASE_CAPSULE | Freq: Two times a day (BID) | ORAL | Status: DC

## 2012-09-16 ENCOUNTER — Encounter (INDEPENDENT_AMBULATORY_CARE_PROVIDER_SITE_OTHER): Payer: Self-pay | Admitting: Surgery

## 2012-09-16 ENCOUNTER — Ambulatory Visit (INDEPENDENT_AMBULATORY_CARE_PROVIDER_SITE_OTHER): Payer: 59 | Admitting: Surgery

## 2012-09-16 NOTE — Progress Notes (Signed)
   Re:   [name] DOB:   [birth year] MRN:   [id number]  Patient's weight < 35 BMI.  She is going to check with her insurance about coverage.  No charge for today.  ASSESSMENT AND PLAN: 1.  Morbid obesity  2.  Thyroid replacement 3. GERD/hiatal hernia  CT scan - 12/04/2011 - shows HH 4.  Serrated adenoma - colonoscopy - 01/09/2012 - J. Marina Goodell. 5.  Hypertension 6.  Hyperlipidemia   Chief Complaint  Patient presents with  . Bariatric Pre-op    Initial   REFERRING PHYSICIAN: Ezequiel Kayser, MD  HISTORY OF PRESENT ILLNESS: [name] is a 51 y.o. (DOB: 02/24/1961)  white female whose primary care physician is Ezequiel Kayser, MD and comes to me today for bariatric surgery.    Past Medical History  Diagnosis Date  . Hypothyroidism   . GERD (gastroesophageal reflux disease)   . Leiomyoma of uterus, unspecified     fibroids  . Esophagitis   . Hiatal hernia   . Esophageal stricture   . Duodenitis without hemorrhage   . Hyperlipidemia   . Hypertension       Past Surgical History  Procedure Date  . Abdominal hysterectomy   . Cholecystectomy   . Rectocele repair   . Upper gastrointestinal endoscopy   . Tubal ligation     BILATERAL  . Hysteroscopy w/ endometrial ablation   . Tonsillectomy       Current Outpatient Prescriptions  Medication Sig Dispense Refill  . aspirin 81 MG tablet Take 81 mg by mouth daily.      . CENESTIN 0.9 MG tablet       . cyclobenzaprine (FLEXERIL) 5 MG tablet Take 5 mg by mouth at bedtime as needed.      . DULoxetine (CYMBALTA) 30 MG capsule Take 30 mg by mouth daily.      Marland Kitchen levothyroxine (SYNTHROID, LEVOTHROID) 75 MCG tablet Take 75 mcg by mouth daily.      Marland Kitchen losartan (COZAAR) 100 MG tablet Take 100 mg by mouth daily.      . Multiple Vitamin (MULTIVITAMIN) capsule Take 1 capsule by mouth daily.      Marland Kitchen omeprazole (PRILOSEC) 40 MG capsule Take 1 capsule (40 mg total) by mouth 2 (two) times daily.  180 capsule  3  . pramipexole (MIRAPEX)  0.125 MG tablet       . rosuvastatin (CRESTOR) 10 MG tablet Take 10 mg by mouth daily.      Marland Kitchen DISCONTD: omeprazole (PRILOSEC) 20 MG capsule TAKE 2 CAPSULES EVERY MORNING (AND 2 CAPSULES EVERY EVENING OCCASIONALLY)  360 capsule  1     No Known Allergies  PHYSICAL EXAM: BP 128/80  Pulse 92  Temp 97 F (36.1 C) (Temporal)  Resp 16  Ht 5\' 5"  (1.651 m)  Wt 199 lb 9.6 oz (90.538 kg)  BMI 33.22 kg/m2  Not done  DATA REVIEWED: Reviewed Epic.  Ovidio Kin, MD,  [facility] [address]  [address]   [place], [place]    [address] Phone:  [phone] FAX:  [phone]

## 2012-11-25 HISTORY — PX: CARPAL TUNNEL RELEASE: SHX101

## 2013-04-15 ENCOUNTER — Encounter: Payer: Self-pay | Admitting: Obstetrics & Gynecology

## 2013-05-04 ENCOUNTER — Telehealth: Payer: Self-pay | Admitting: *Deleted

## 2013-05-04 NOTE — Telephone Encounter (Signed)
I [facility] in [facility] [place]  It will cost her $50/month.  The Cenestin would have been $35/month.  I also called in refills in case she has trouble with the mail order.

## 2013-05-04 NOTE — Telephone Encounter (Signed)
Patient faxed a request for an alternative to Cenestin 0.9 mg as it is on backorder. She would like [facility] [address]. She only has 3 pills left.

## 2013-05-04 NOTE — Telephone Encounter (Signed)
Patient aware she is no longer going to use the mail order company as it isn't working out for her but will discuss at her upcoming appointment.

## 2013-05-12 ENCOUNTER — Encounter: Payer: Self-pay | Admitting: *Deleted

## 2013-05-18 ENCOUNTER — Ambulatory Visit: Payer: Self-pay | Admitting: Obstetrics & Gynecology

## 2013-07-08 ENCOUNTER — Encounter: Payer: Self-pay | Admitting: Obstetrics & Gynecology

## 2013-07-08 ENCOUNTER — Ambulatory Visit (INDEPENDENT_AMBULATORY_CARE_PROVIDER_SITE_OTHER): Payer: 59 | Admitting: Obstetrics & Gynecology

## 2013-07-08 VITALS — BP 124/82 | HR 60 | Resp 12 | Ht 64.5 in | Wt 197.2 lb

## 2013-07-08 DIAGNOSIS — R32 Unspecified urinary incontinence: Secondary | ICD-10-CM

## 2013-07-08 DIAGNOSIS — Z01419 Encounter for gynecological examination (general) (routine) without abnormal findings: Secondary | ICD-10-CM

## 2013-07-08 DIAGNOSIS — IMO0002 Reserved for concepts with insufficient information to code with codable children: Secondary | ICD-10-CM

## 2013-07-08 DIAGNOSIS — N8111 Cystocele, midline: Secondary | ICD-10-CM

## 2013-07-08 MED ORDER — ESTROGENS CONJ SYNTHETIC A 0.9 MG PO TABS: 0.9000 mg | ORAL_TABLET | Freq: Every day | ORAL | Status: DC

## 2013-07-08 MED ORDER — VALACYCLOVIR HCL 500 MG PO TABS: ORAL_TABLET | ORAL | Status: DC

## 2013-07-08 NOTE — Progress Notes (Signed)
52 y.o. G3P3 MarriedCaucasianF here for annual exam.  Frustrated with weight.  Ready to really do something about this.  Took Qsymia from Dr. Waynard Edwards.  No vaginal bleeding.  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  Labs done in December.    Patient's last menstrual period was 11/25/2005.          Sexually active: yes  The current method of family planning is status post hysterectomy.    Exercising: no  not regularly Smoker:  no  Health Maintenance: Pap:  01/18/08 WNL, h/o TVH 4/07 History of abnormal Pap:  no MMG:  07/14/12 normal Colonoscopy:  2013 repeat in 5 years, Dr. Marina Goodell  BMD:   none TDaP:  06/2005 Screening Labs: PCP, Hb today: PCP, Urine today: PCP   reports that she quit smoking about 10 years ago. She has never used smokeless tobacco. She reports that she does not drink alcohol or use illicit drugs.  Past Medical History  Diagnosis Date  . Hypothyroidism   . GERD (gastroesophageal reflux disease)   . Leiomyoma of uterus, unspecified     fibroids  . Esophagitis   . Hiatal hernia   . Esophageal stricture   . Duodenitis without hemorrhage   . Hyperlipidemia   . Hypertension     Past Surgical History  Procedure Laterality Date  . Abdominal hysterectomy    . Cholecystectomy    . Rectocele repair    . Upper gastrointestinal endoscopy    . Tubal ligation      BILATERAL  . Hysteroscopy w/ endometrial ablation    . Tonsillectomy      Current Outpatient Prescriptions  Medication Sig Dispense Refill  . aspirin 81 MG tablet Take 81 mg by mouth daily.      . cyclobenzaprine (FLEXERIL) 10 MG tablet Take 10 mg by mouth. Takes 5mg  qhs      . DULoxetine (CYMBALTA) 30 MG capsule Take 30 mg by mouth daily.      Marland Kitchen levothyroxine (SYNTHROID, LEVOTHROID) 75 MCG tablet Take 75 mcg by mouth daily. One day a week take 1/2 tablet      . losartan (COZAAR) 100 MG tablet Take 100 mg by mouth daily.      . Multiple Vitamin (MULTIVITAMIN) capsule Take 1 capsule by  mouth daily.      Marland Kitchen omeprazole (PRILOSEC) 40 MG capsule Take 1 capsule (40 mg total) by mouth 2 (two) times daily.  180 capsule  3  . pramipexole (MIRAPEX) 0.125 MG tablet       . PREMARIN 0.9 MG tablet 0.9 mg daily.      . rosuvastatin (CRESTOR) 10 MG tablet Take 10 mg by mouth daily.       No current facility-administered medications for this visit.    Family History  Problem Relation Age of Onset  . Breast cancer      grandmother  . Celiac disease Mother   . Heart disease Father   . Stroke Father   . Irritable bowel syndrome Sister   . Colon cancer Neg Hx   . Cancer Maternal Grandmother     lung  . Cancer Paternal Grandmother     breast    ROS:  Pertinent items are noted in HPI.  Otherwise, a comprehensive ROS was negative.  Exam:   BP 124/82  Pulse 60  Resp 12  Ht 5' 4.5" (1.638 m)  Wt 197 lb 3.2 oz (89.449 kg)  BMI 33.34 kg/m2  LMP 11/25/2005  Weight change: +3lbs  Height: 5' 4.5" (163.8 cm)  Ht Readings from Last 3 Encounters:  07/08/13 5' 4.5" (1.638 m)  09/16/12 5\' 5"  (1.651 m)  01/09/12 5\' 5"  (1.651 m)    General appearance: alert, cooperative and appears stated age Head: Normocephalic, without obvious abnormality, atraumatic Neck: no adenopathy, supple, symmetrical, trachea midline and thyroid normal to inspection and palpation Lungs: clear to auscultation bilaterally Breasts: normal appearance, no masses or tenderness Heart: regular rate and rhythm Abdomen: soft, non-tender; bowel sounds normal; no masses,  no organomegaly Extremities: extremities normal, atraumatic, no cyanosis or edema Skin: Skin color, texture, turgor normal. No rashes or lesions Lymph nodes: Cervical, supraclavicular, and axillary nodes normal. No abnormal inguinal nodes palpated Neurologic: Grossly normal   Pelvic: External genitalia:  no lesions              Urethra:  normal appearing urethra with no masses, tenderness or lesions              Bartholins and Skenes: normal                  Vagina: normal appearing vagina with normal color and discharge, no lesions, 3rd degree cystocele              Cervix: absent              Pap taken: no Bimanual Exam:  Uterus:  uterus absent              Adnexa: normal adnexa and no mass, fullness, tenderness               Rectovaginal: Confirms               Anus:  normal sphincter tone, no lesions  A:  Well Woman with normal exam H/O TVH, on HRT Fibromyalgia Hypothyroidism GERD Mild SUI with worsening cystocele this year H/O HSV 1  P:   Mammogram yearly.  Due this month.  Patient aware. pap smear not indicated.  H/O [facility] 5/07 Referral to pelvic PT at [facility] Change to Cenestin and see if pharmacy carries it.  0.9mg  daily. Sometimes she has had trouble getting the generic Rx for Valtrex to pharmacy. return annually or prn  An After Visit Summary was printed and given to the patient.

## 2013-07-08 NOTE — Patient Instructions (Signed)

## 2013-07-14 ENCOUNTER — Telehealth: Payer: Self-pay | Admitting: *Deleted

## 2013-07-14 MED ORDER — ESTROGENS CONJUGATED 0.9 MG PO TABS: 0.9000 mg | ORAL_TABLET | Freq: Every day | ORAL | Status: DC

## 2013-07-14 NOTE — Telephone Encounter (Signed)
[facility] C[facility]in 0.9 mg is on back order Last Refilled: 07/08/13 #90/4 @ [facility]   Please Advise  (Chart in your door)

## 2013-07-14 NOTE — Telephone Encounter (Signed)
Had this problem last year.  I"ll just do order for Premarin to pharmacy.  You can take chart away.  I know this situation well.  Thanks.

## 2013-07-14 NOTE — Telephone Encounter (Signed)
Okay I will thanks Dr.Miller!

## 2013-09-30 ENCOUNTER — Other Ambulatory Visit: Payer: Self-pay

## 2013-11-09 ENCOUNTER — Encounter: Payer: Self-pay | Admitting: Obstetrics & Gynecology

## 2013-11-09 MED ORDER — VALACYCLOVIR HCL 500 MG PO TABS: ORAL_TABLET | ORAL | Status: DC

## 2013-11-09 NOTE — Telephone Encounter (Signed)
RF for Valtrex done through [facility] 2015 after receiving request from pt in MyChart.

## 2014-02-23 ENCOUNTER — Telehealth: Payer: Self-pay | Admitting: Emergency Medicine

## 2014-02-23 NOTE — Telephone Encounter (Signed)
Can you see what pt wants me to do?

## 2014-02-23 NOTE — Telephone Encounter (Signed)
Dr. Sabra Heck, I received a request for this patient to have Valacyclovir 500 mg changed to Acyclovir due to her insurance [place] [employer]. Do you want to change her to Acyclovir or for me to complete prior authorization for the Valacyclovir?

## 2014-02-23 NOTE — Telephone Encounter (Signed)
Spoke with patient. She is okay with change to Acyclovir as it will be covered by her insurance. Advised would send message to Dr. Sabra Heck request rx change.

## 2014-02-25 MED ORDER — ACYCLOVIR 400 MG PO TABS: 400.0000 mg | ORAL_TABLET | Freq: Two times a day (BID) | ORAL | Status: DC

## 2014-02-25 NOTE — Telephone Encounter (Signed)
rx to pharmacy for acyclovir 400mg  bid.  Encounter closed.

## 2014-02-28 NOTE — Telephone Encounter (Signed)
Spoke with patient. Advised prescription for acyclovir was sent to pharmacy and is ready for pick up. Patient agreeable and verbalizes understanding.  Patient agreeable to disposition. Will close encounter.

## 2014-03-22 ENCOUNTER — Other Ambulatory Visit: Payer: Self-pay | Admitting: Obstetrics & Gynecology

## 2014-03-23 NOTE — Telephone Encounter (Signed)
[facility] Last filled - 07/14/13, #90 X 4 Last [facility] - 07/08/13 Next [facility] - 10/04/14 1 additional 90 day supply sent to last until [facility] in 09/2014.

## 2014-04-17 ENCOUNTER — Other Ambulatory Visit: Payer: Self-pay | Admitting: Obstetrics & Gynecology

## 2014-05-15 ENCOUNTER — Other Ambulatory Visit: Payer: Self-pay | Admitting: Obstetrics & Gynecology

## 2014-05-16 NOTE — Telephone Encounter (Signed)
Last [facility]: 07/08/13  Last refilled: 03/23/14 #90/0 refills  [facility] Scheduled: 10/04/14 with Dr. Deborra Medina to refill until [facility]   Please Advise.

## 2014-07-08 ENCOUNTER — Other Ambulatory Visit: Payer: Self-pay | Admitting: Obstetrics & Gynecology

## 2014-07-08 NOTE — Telephone Encounter (Signed)
Last refilled: 03/23/14 #90/0refills Last [facility]: 07/08/13 with Dr. Bernita Buffy Scheduled: 10/04/14 with Dr. Sabra Heck Mammogram: 09/16/13 Bi-Rads 1  Okay to refill until [facility]?  Please Advise.

## 2014-09-26 ENCOUNTER — Encounter: Payer: Self-pay | Admitting: Obstetrics & Gynecology

## 2014-10-04 ENCOUNTER — Encounter: Payer: Self-pay | Admitting: Obstetrics & Gynecology

## 2014-10-04 ENCOUNTER — Ambulatory Visit (INDEPENDENT_AMBULATORY_CARE_PROVIDER_SITE_OTHER): Payer: 59 | Admitting: Obstetrics & Gynecology

## 2014-10-04 VITALS — BP 110/68 | HR 72 | Resp 16 | Ht 64.5 in | Wt 201.4 lb

## 2014-10-04 DIAGNOSIS — Z01419 Encounter for gynecological examination (general) (routine) without abnormal findings: Secondary | ICD-10-CM

## 2014-10-04 MED ORDER — ACYCLOVIR 400 MG PO TABS: 400.0000 mg | ORAL_TABLET | Freq: Two times a day (BID) | ORAL | Status: DC

## 2014-10-04 MED ORDER — ESTROGENS CONJUGATED 0.9 MG PO TABS: 0.9000 mg | ORAL_TABLET | Freq: Every day | ORAL | Status: DC

## 2014-10-04 NOTE — Progress Notes (Signed)
53 y.o. G3P3 MarriedCaucasianF here for annual exam.  No vaginal bleeding.  Still working at [employer] but she is finding work more stressful this year.  Has a new granddaughter, 2 months old--Autumn.  No vaginal bleeding.     PCP:  Dr. Joylene Draft  Patient's last menstrual period was 11/25/2005.          Sexually active: Yes.    The current method of family planning is status post hysterectomy.    Exercising: No.  not regularly Smoker:  Former smoker  Health Maintenance: Pap:  01/18/08 WNL History of abnormal Pap:  Yes LGSIL pap-negative colpo bx and ECC MMG:  09/16/13-normal  Colonoscopy:  2013-repeat in 5 years Dr Henrene Pastor BMD:   none TDaP:  2006 Screening Labs: PCP, Hb today: PCP, Urine today: PCP   reports that she quit smoking about 11 years ago. She has never used smokeless tobacco. She reports that she drinks about 0.6 oz of alcohol per week. She reports that she does not use illicit drugs.  Past Medical History  Diagnosis Date  . Hypothyroidism   . GERD (gastroesophageal reflux disease)   . Leiomyoma of uterus, unspecified     fibroids  . Esophagitis   . Hiatal hernia   . Esophageal stricture   . Duodenitis without hemorrhage   . Hyperlipidemia   . Hypertension     Past Surgical History  Procedure Laterality Date  . Total vaginal hysterectomy  5/07  . Cholecystectomy    . Rectocele repair    . Upper gastrointestinal endoscopy    . Tubal ligation      BILATERAL  . Hysteroscopy w/ endometrial ablation  4/05  . Tonsillectomy    . Cortisone injection  10/03/14    right shoulder    Current Outpatient Prescriptions  Medication Sig Dispense Refill  . acyclovir (ZOVIRAX) 400 MG tablet Take 1 tablet (400 mg total) by mouth 2 (two) times daily. 60 tablet 12  . aspirin 81 MG tablet Take 81 mg by mouth daily.    . cyclobenzaprine (FLEXERIL) 10 MG tablet Take 10 mg by mouth. Takes 5mg  qhs    . DULoxetine (CYMBALTA) 30 MG capsule Take 30 mg by mouth daily.    Marland Kitchen  HYDROcodone-acetaminophen (NORCO/VICODIN) 5-325 MG per tablet   0  . levothyroxine (SYNTHROID, LEVOTHROID) 75 MCG tablet Take 75 mcg by mouth daily. One day a week take 1/2 tablet    . losartan (COZAAR) 100 MG tablet Take 100 mg by mouth daily.    . Multiple Vitamin (MULTIVITAMIN) capsule Take 1 capsule by mouth daily.    Marland Kitchen omeprazole (PRILOSEC) 40 MG capsule Take 1 capsule (40 mg total) by mouth 2 (two) times daily. 180 capsule 3  . pramipexole (MIRAPEX) 0.25 MG tablet   4  . PREMARIN 0.9 MG tablet TAKE 1 TABLET (0.9 MG TOTAL) BY MOUTH DAILY. TAKE DAILY FOR 21 DAYS THEN DO NOT TAKE FOR 7 DAYS. 90 tablet 3  . rosuvastatin (CRESTOR) 10 MG tablet Take 10 mg by mouth daily.     No current facility-administered medications for this visit.    Family History  Problem Relation Age of Onset  . Breast cancer      grandmother  . Celiac disease Mother   . Heart disease Father   . Stroke Father   . Irritable bowel syndrome Sister   . Colon cancer Neg Hx   . Cancer Maternal Grandmother     lung  . Cancer Paternal Grandmother  breast    ROS:  Pertinent items are noted in HPI.  Otherwise, a comprehensive ROS was negative.  Exam:   BP 110/68 mmHg  Pulse 72  Resp 16  Ht 5' 4.5" (1.638 m)  Wt 201 lb 6.4 oz (91.354 kg)  BMI 34.05 kg/m2  LMP 11/25/2005  Weight change: -4#  Height: 5' 4.5" (163.8 cm)  Ht Readings from Last 3 Encounters:  10/04/14 5' 4.5" (1.638 m)  07/08/13 5' 4.5" (1.638 m)  09/16/12 5\' 5"  (1.651 m)    General appearance: alert, cooperative and appears stated age Head: Normocephalic, without obvious abnormality, atraumatic Neck: no adenopathy, supple, symmetrical, trachea midline and thyroid normal to inspection and palpation Lungs: clear to auscultation bilaterally Breasts: normal appearance, no masses or tenderness Heart: regular rate and rhythm Abdomen: soft, non-tender; bowel sounds normal; no masses,  no organomegaly Extremities: extremities normal, atraumatic,  no cyanosis or edema Skin: Skin color, texture, turgor normal. No rashes or lesions Lymph nodes: Cervical, supraclavicular, and axillary nodes normal. No abnormal inguinal nodes palpated Neurologic: Grossly normal   Pelvic: External genitalia:  no lesions              Urethra:  normal appearing urethra with no masses, tenderness or lesions              Bartholins and Skenes: normal                 Vagina: normal appearing vagina with normal color and discharge, no lesions, cystocele present--3rd degree              Cervix: no lesions              Pap taken: No. Bimanual Exam:  Uterus:  uterus absent              Adnexa: normal adnexa and no mass, fullness, tenderness               Rectovaginal: Confirms               Anus:  normal sphincter tone, no lesions  A:  Well Woman with normal exam H/O TVH, on HRT Fibromyalgia Hypothyroidism GERD Mild SUI  Cystocele H/O HSV 1  P: Mammogram yearly. Due. Patient aware. pap smear not indicated. H/O [facility] 5/07 Premarin 0.9mg  daily #90/4RF. Acyclovir 400mg  bid #180/4RF return annually or prn  An After Visit Summary was printed and given to the patient.

## 2015-07-25 ENCOUNTER — Telehealth: Payer: Self-pay | Admitting: *Deleted

## 2015-07-25 ENCOUNTER — Other Ambulatory Visit: Payer: Self-pay | Admitting: Obstetrics & Gynecology

## 2015-07-25 NOTE — Telephone Encounter (Signed)
Left a message for patient to call. We have a refill request for her Premarin- however she is over due for her mammogram. -eh

## 2015-07-25 NOTE — Telephone Encounter (Signed)
Medication refill request: Premarin  Last AEX:  10-04-14 Next AEX: 12-08-15  Last MMG (if hormonal medication request): 09-16-13 WNL  Refill authorized: called patient and left a message for a returned call-patient needs a mammogram

## 2015-07-26 NOTE — Telephone Encounter (Signed)
Patient called back and I let her know that Dr. Sabra Heck did a 1 month refill on her Premarin but would not be refilling again till she has her Mammogram. -eh

## 2015-07-28 ENCOUNTER — Telehealth: Payer: Self-pay | Admitting: Obstetrics & Gynecology

## 2015-07-28 MED ORDER — ESTROGENS CONJUGATED 0.9 MG PO TABS: 0.9000 mg | ORAL_TABLET | Freq: Every day | ORAL | Status: DC

## 2015-07-28 NOTE — Telephone Encounter (Signed)
[name] and spoke with Butch Penny.  They will cancel 21 day order that was transferred in and give daily order that Dr. Sabra Heck ordered.  Encounter is closed.

## 2015-07-28 NOTE — Addendum Note (Signed)
Addended by: Michele Mcalpine on: 07/28/2015 03:33 PM   Modules accepted: Orders, Medications

## 2015-07-28 NOTE — Telephone Encounter (Signed)
Patient is scheduled to have her mammogram this afternoon and would like the prescription for Premarin sent to [facility] outpatient pharmacy is possible.

## 2015-07-28 NOTE — Telephone Encounter (Signed)
Rx should be for daily.  Change to daily and resent RX.  May need to cancel day 1-21 order.  Thanks.

## 2015-07-28 NOTE — Telephone Encounter (Addendum)
Call to patient.  Advised Premarin 0.9 mg tablets sent to [facility] on 07/25/15.  Patient would like prescription sent to [place]. Advised patient to contact Pharmacy and have prescription transferred. Patient agreeable. Advised will need mammogram results to place further refills.   Dr. Sabra Heck can you review order?  Order from 07/25/15 is Premarin 0.9 mg tablets PO days 1-21. Prior order is Premarin 0.9 mg tablets po daily.   Routing to Dr. Sabra Heck.  Encounter closed erroneously.

## 2015-07-28 NOTE — Addendum Note (Signed)
Addended by: Megan Salon on: 07/28/2015 03:20 PM   Modules accepted: Orders

## 2015-12-08 ENCOUNTER — Ambulatory Visit: Payer: 59 | Admitting: Obstetrics & Gynecology

## 2015-12-19 ENCOUNTER — Ambulatory Visit (INDEPENDENT_AMBULATORY_CARE_PROVIDER_SITE_OTHER): Payer: 59 | Admitting: Obstetrics & Gynecology

## 2015-12-19 ENCOUNTER — Encounter: Payer: Self-pay | Admitting: Obstetrics & Gynecology

## 2015-12-19 VITALS — BP 116/80 | HR 88 | Resp 14 | Ht 64.75 in | Wt 201.0 lb

## 2015-12-19 DIAGNOSIS — Z01419 Encounter for gynecological examination (general) (routine) without abnormal findings: Secondary | ICD-10-CM

## 2015-12-19 MED ORDER — ESTROGENS CONJUGATED 0.9 MG PO TABS: 0.9000 mg | ORAL_TABLET | Freq: Every day | ORAL | Status: DC

## 2015-12-19 MED ORDER — ACYCLOVIR 400 MG PO TABS: 400.0000 mg | ORAL_TABLET | Freq: Two times a day (BID) | ORAL | Status: DC

## 2015-12-19 MED FILL — CYCLOBENZAPRINE 10 MG TAB: 10 | 30 days supply | Qty: 30 | Fill #1

## 2015-12-19 NOTE — Progress Notes (Signed)
55 y.o. G3P3 MarriedCaucasianF here for annual exam.  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  She will be finishing in May 2018.  Considering family practice and ob/gyn when she is finished with her degree.    No vaginal bleeding.    Pt wonders if her cystocele has returned.  She's having more pressure and more aches in her pelvis.   PCP:  Dr. Joylene Draft.  Last blood work was about a year ago.  Has appt in two weeks.    Patient's last menstrual period was 11/25/2005.          Sexually active: Yes.    The current method of family planning is status post hysterectomy.    Exercising: No.  The patient does not participate in regular exercise at present. Smoker:  no  Health Maintenance: Pap:  01/18/2008 Normal  History of abnormal Pap:  Yes, LGSIL  MMG:  07/28/15 BIRADS1:neg Colonoscopy:  01/09/12 Polyps. Repeat 5 years  BMD:   Never TDaP:  2016 Health Dept Screening Labs: PCP, Hb today: PCP, Urine today: PCP   reports that she quit smoking about 12 years ago. She has never used smokeless tobacco. She reports that she does not drink alcohol or use illicit drugs.  Past Medical History  Diagnosis Date  . Hypothyroidism   . GERD (gastroesophageal reflux disease)   . Leiomyoma of uterus, unspecified     fibroids  . Esophagitis   . Hiatal hernia   . Esophageal stricture   . Duodenitis without hemorrhage   . Hyperlipidemia   . Hypertension     Past Surgical History  Procedure Laterality Date  . Total vaginal hysterectomy  5/07  . Cholecystectomy    . Rectocele repair    . Upper gastrointestinal endoscopy    . Tubal ligation      BILATERAL  . Hysteroscopy w/ endometrial ablation  4/05  . Tonsillectomy    . Cortisone injection  10/03/14    right shoulder  . Abdominal hysterectomy      Current Outpatient Prescriptions  Medication Sig Dispense Refill  . acyclovir (ZOVIRAX) 400 MG tablet Take 1 tablet (400 mg total) by mouth 2 (two) times daily. 180 tablet  4  . aspirin 81 MG tablet Take 81 mg by mouth daily.    . Cholecalciferol (VITAMIN D) 2000 units tablet Take 2,000 Units by mouth 2 (two) times daily.    . cyclobenzaprine (FLEXERIL) 10 MG tablet Take 10 mg by mouth. Takes 5mg  qhs    . DULoxetine (CYMBALTA) 30 MG capsule Take 30 mg by mouth daily.    Marland Kitchen estrogens, conjugated, (PREMARIN) 0.9 MG tablet Take 1 tablet (0.9 mg total) by mouth daily. 90 tablet 1  . levothyroxine (SYNTHROID, LEVOTHROID) 75 MCG tablet Take 75 mcg by mouth daily. One day a week take 1/2 tablet    . losartan (COZAAR) 100 MG tablet Take 100 mg by mouth daily.    . Multiple Vitamin (MULTIVITAMIN) capsule Take 1 capsule by mouth daily.    Marland Kitchen omeprazole (PRILOSEC) 40 MG capsule Take 1 capsule (40 mg total) by mouth 2 (two) times daily. 180 capsule 3  . pramipexole (MIRAPEX) 0.25 MG tablet Take 0.25 mg by mouth at bedtime.   4  . rosuvastatin (CRESTOR) 10 MG tablet Take 10 mg by mouth daily.    . vitamin B-12 (CYANOCOBALAMIN) 100 MCG tablet Take 100 mcg by mouth daily.     No current facility-administered medications for this  visit.    Family History  Problem Relation Age of Onset  . Breast cancer      grandmother  . Celiac disease Mother   . Heart disease Father   . Stroke Father   . Irritable bowel syndrome Sister   . Colon cancer Neg Hx   . Cancer Maternal Grandmother     lung  . Cancer Paternal Grandmother     breast  . Osteoporosis Mother     ROS:  Pertinent items are noted in HPI.  Otherwise, a comprehensive ROS was negative.  Exam:   BP 116/80 mmHg  Pulse 88  Resp 14  Ht 5' 4.75" (1.645 m)  Wt 201 lb (91.173 kg)  BMI 33.69 kg/m2  LMP 11/25/2005  Weight change: no change   Height: 5' 4.75" (164.5 cm)  Ht Readings from Last 3 Encounters:  12/19/15 5' 4.75" (1.645 m)  10/04/14 5' 4.5" (1.638 m)  07/08/13 5' 4.5" (1.638 m)    General appearance: alert, cooperative and appears stated age Head: Normocephalic, without obvious abnormality,  atraumatic Neck: no adenopathy, supple, symmetrical, trachea midline and thyroid normal to inspection and palpation Lungs: clear to auscultation bilaterally Breasts: normal appearance, no masses or tenderness Heart: regular rate and rhythm Abdomen: soft, non-tender; bowel sounds normal; no masses,  no organomegaly Extremities: extremities normal, atraumatic, no cyanosis or edema Skin: Skin color, texture, turgor normal. No rashes or lesions Lymph nodes: Cervical, supraclavicular, and axillary nodes normal. No abnormal inguinal nodes palpated Neurologic: Grossly normal   Pelvic: External genitalia:  no lesions              Urethra:  normal appearing urethra with no masses, tenderness or lesions              Bartholins and Skenes: normal                 Vagina: normal appearing vagina with normal color and discharge, no lesions, cystocele Grade 4              Cervix: absent              Pap taken: No. Bimanual Exam:  Uterus:  uterus absent              Adnexa: normal adnexa and no mass, fullness, tenderness               Rectovaginal: Confirms               Anus:  normal sphincter tone, no lesions  Chaperone was present for exam.  A:  Well Woman with normal exam H/O TVH, on HRT Fibromyalgia Hypothyroidism GERD SUI and OAB Cystocele, increased in size this past year H/O HSV 1  P: Mammogram yearly. pap smear not indicated. H/O [facility] 5/07 Premarin 0.9mg  daily #90/4RF Acyclovir 400mg  bid #180/4RF Labs and vaccines with Dr. Joylene Draft Pessary vs PT vs In tone device discussed.  Pt is interested in the In Tone device.  Will check about insurance coverage for pt. return annually or prn

## 2016-01-01 MED FILL — LOSARTAN POTASSIUM 100 MG T: 100 | 90 days supply | Qty: 90 | Fill #0

## 2016-01-02 DIAGNOSIS — E784 Other hyperlipidemia: Secondary | ICD-10-CM | POA: Diagnosis not present

## 2016-01-02 DIAGNOSIS — Z0000 Encounter for general adult medical examination without abnormal findings: Secondary | ICD-10-CM | POA: Diagnosis not present

## 2016-01-03 ENCOUNTER — Encounter: Payer: Self-pay | Admitting: Obstetrics & Gynecology

## 2016-01-04 NOTE — Telephone Encounter (Signed)
Staff message to Dr. Sabra Heck.

## 2016-01-09 DIAGNOSIS — M797 Fibromyalgia: Secondary | ICD-10-CM | POA: Diagnosis not present

## 2016-01-09 DIAGNOSIS — G5600 Carpal tunnel syndrome, unspecified upper limb: Secondary | ICD-10-CM | POA: Diagnosis not present

## 2016-01-09 DIAGNOSIS — Z0000 Encounter for general adult medical examination without abnormal findings: Secondary | ICD-10-CM | POA: Diagnosis not present

## 2016-01-09 DIAGNOSIS — E784 Other hyperlipidemia: Secondary | ICD-10-CM | POA: Diagnosis not present

## 2016-01-09 DIAGNOSIS — M5380 Other specified dorsopathies, site unspecified: Secondary | ICD-10-CM | POA: Diagnosis not present

## 2016-01-09 DIAGNOSIS — K219 Gastro-esophageal reflux disease without esophagitis: Secondary | ICD-10-CM | POA: Diagnosis not present

## 2016-01-09 DIAGNOSIS — G2581 Restless legs syndrome: Secondary | ICD-10-CM | POA: Diagnosis not present

## 2016-01-09 DIAGNOSIS — M4800 Spinal stenosis, site unspecified: Secondary | ICD-10-CM | POA: Diagnosis not present

## 2016-01-09 DIAGNOSIS — N951 Menopausal and female climacteric states: Secondary | ICD-10-CM | POA: Diagnosis not present

## 2016-01-17 MED FILL — LEVOTHYROXINE 75 MCG TABLET: 75 | 90 days supply | Qty: 90 | Fill #1

## 2016-01-17 MED FILL — PREMARIN 0.9 MG TABLET: 0.9 | 90 days supply | Qty: 90 | Fill #0

## 2016-01-19 SURGERY — DIALYSIS/PERMA CATHETER INSERTION

## 2016-01-22 MED FILL — OSELTAMIVIR PHOS 75 MG CAP: 75 | 5 days supply | Qty: 10 | Fill #0

## 2016-01-29 ENCOUNTER — Encounter: Payer: Self-pay | Admitting: Obstetrics & Gynecology

## 2016-01-29 MED FILL — PRAMIPEXOLE 0.25 MG TABLET: 0.25 | 30 days supply | Qty: 30 | Fill #2

## 2016-02-13 MED FILL — OMEPRAZOLE DR 40 MG CAPSULE: 40 | 90 days supply | Qty: 180 | Fill #1

## 2016-03-07 MED FILL — PRAMIPEXOLE 0.25 MG TABLET: 0.25 | 90 days supply | Qty: 90 | Fill #0

## 2016-04-02 MED FILL — LOSARTAN POTASSIUM 100 MG T: 100 | 90 days supply | Qty: 90 | Fill #1

## 2016-04-12 MED FILL — ACYCLOVIR 400 MG TABLET: 400 | 90 days supply | Qty: 180 | Fill #0

## 2016-04-25 MED FILL — PREMARIN 0.9 MG TABLET: 0.9 | 90 days supply | Qty: 90 | Fill #1

## 2016-04-25 MED FILL — LEVOTHYROXINE 75 MCG TABLET: 75 | 90 days supply | Qty: 90 | Fill #0

## 2016-05-07 MED FILL — OMEPRAZOLE DR 40 MG CAPSULE: 40 | 90 days supply | Qty: 180 | Fill #2

## 2016-05-14 MED FILL — EMEND 40 MG CAPSULE: 40 | 3 days supply | Qty: 3 | Fill #0

## 2016-05-14 MED FILL — ONDANSETRON ODT 4 MG TABLET: 4 | 7 days supply | Qty: 30 | Fill #0

## 2016-05-14 MED FILL — diazePAM 2 MG TABS: 2 | 3 days supply | Qty: 10 | Fill #0

## 2016-05-14 MED FILL — METOCLOPRAMIDE 10 MG TABLET: 10 | 6 days supply | Qty: 20 | Fill #0

## 2016-05-30 MED FILL — DULoxetine HCL 30 MG CPEP: 30 | 30 days supply | Qty: 30 | Fill #0

## 2016-05-30 MED FILL — PRAMIPEXOLE 0.25 MG TABLET: 0.25 | 90 days supply | Qty: 90 | Fill #1

## 2016-06-10 MED FILL — SAXENDA 18 MG/3 ML PEN: 18 | 30 days supply | Qty: 15 | Fill #0

## 2016-07-03 MED FILL — DULoxetine HCL 30 MG CPEP: 30 | 30 days supply | Qty: 30 | Fill #1

## 2016-07-03 MED FILL — LOSARTAN POTASSIUM 100 MG T: 100 | 90 days supply | Qty: 90 | Fill #2

## 2016-07-24 MED FILL — OMEPRAZOLE DR 40 MG CAPSULE: 40 | 90 days supply | Qty: 180 | Fill #3

## 2016-07-24 MED FILL — PREMARIN 0.9 MG TABLET: 0.9 | 90 days supply | Qty: 90 | Fill #2

## 2016-07-30 MED FILL — SAXENDA 18 MG/3 ML PEN: 18 | 30 days supply | Qty: 15 | Fill #1

## 2016-07-30 MED FILL — LEVOTHYROXINE 75 MCG TABLET: 75 | 90 days supply | Qty: 90 | Fill #1

## 2016-07-30 MED FILL — DULoxetine HCL 30 MG CPEP: 30 | 30 days supply | Qty: 30 | Fill #2

## 2016-08-07 DIAGNOSIS — Z1231 Encounter for screening mammogram for malignant neoplasm of breast: Secondary | ICD-10-CM | POA: Diagnosis not present

## 2016-08-07 DIAGNOSIS — Z803 Family history of malignant neoplasm of breast: Secondary | ICD-10-CM | POA: Diagnosis not present

## 2016-08-14 DIAGNOSIS — H2513 Age-related nuclear cataract, bilateral: Secondary | ICD-10-CM | POA: Diagnosis not present

## 2016-08-14 DIAGNOSIS — H52222 Regular astigmatism, left eye: Secondary | ICD-10-CM | POA: Diagnosis not present

## 2016-08-14 DIAGNOSIS — H524 Presbyopia: Secondary | ICD-10-CM | POA: Diagnosis not present

## 2016-08-14 DIAGNOSIS — H5213 Myopia, bilateral: Secondary | ICD-10-CM | POA: Diagnosis not present

## 2016-08-14 DIAGNOSIS — H04123 Dry eye syndrome of bilateral lacrimal glands: Secondary | ICD-10-CM | POA: Diagnosis not present

## 2016-08-20 ENCOUNTER — Encounter: Payer: Self-pay | Admitting: Obstetrics & Gynecology

## 2016-08-29 MED FILL — DULoxetine HCL 30 MG CPEP: 30 | 30 days supply | Qty: 30 | Fill #3

## 2016-08-29 MED FILL — PRAMIPEXOLE 0.25 MG TABLET: 0.25 | 90 days supply | Qty: 90 | Fill #2

## 2016-09-03 MED FILL — SAXENDA 18 MG/3 ML PEN: 18 | 30 days supply | Qty: 15 | Fill #2

## 2016-10-01 MED FILL — DULoxetine HCL 30 MG CPEP: 30 | 30 days supply | Qty: 30 | Fill #4

## 2016-10-01 MED FILL — LOSARTAN POTASSIUM 100 MG T: 100 | 90 days supply | Qty: 90 | Fill #3

## 2016-10-07 MED FILL — SAXENDA 18 MG/3 ML PEN: 18 | 30 days supply | Qty: 15 | Fill #3

## 2016-10-15 MED FILL — OMEPRAZOLE DR 40 MG CAPSULE: 40 | 90 days supply | Qty: 180 | Fill #0

## 2016-11-01 MED FILL — PREMARIN 0.9 MG TABLET: 0.9 | 90 days supply | Qty: 90 | Fill #3

## 2016-11-01 MED FILL — DULoxetine HCL 30 MG CPEP: 30 | 30 days supply | Qty: 30 | Fill #5

## 2016-11-01 MED FILL — LEVOTHYROXINE 75 MCG TABLET: 75 | 90 days supply | Qty: 90 | Fill #2

## 2016-11-22 MED FILL — PRAMIPEXOLE 0.25 MG TABLET: 0.25 | 90 days supply | Qty: 90 | Fill #3

## 2016-11-26 MED FILL — SAXENDA 18 MG/3 ML PEN: 18 | 30 days supply | Qty: 15 | Fill #0

## 2016-11-26 MED FILL — UNIFINE PENTIPS 8MM 31G: 31G X 8 MM | 90 days supply | Qty: 100 | Fill #0

## 2016-11-28 ENCOUNTER — Emergency Department (HOSPITAL_COMMUNITY): Payer: 59

## 2016-11-28 ENCOUNTER — Encounter (HOSPITAL_COMMUNITY): Payer: Self-pay | Admitting: Radiology

## 2016-11-28 ENCOUNTER — Emergency Department (HOSPITAL_COMMUNITY)
Admission: EM | Admit: 2016-11-28 | Discharge: 2016-11-28 | Disposition: A | Discharge status: Home / Self Care | Payer: 59 | Attending: Emergency Medicine | Admitting: Emergency Medicine

## 2016-11-28 DIAGNOSIS — Z7982 Long term (current) use of aspirin: Secondary | ICD-10-CM | POA: Diagnosis not present

## 2016-11-28 DIAGNOSIS — J690 Pneumonitis due to inhalation of food and vomit: Secondary | ICD-10-CM | POA: Diagnosis not present

## 2016-11-28 DIAGNOSIS — E039 Hypothyroidism, unspecified: Secondary | ICD-10-CM | POA: Diagnosis not present

## 2016-11-28 DIAGNOSIS — Z87891 Personal history of nicotine dependence: Secondary | ICD-10-CM | POA: Diagnosis not present

## 2016-11-28 DIAGNOSIS — R110 Nausea: Secondary | ICD-10-CM | POA: Diagnosis not present

## 2016-11-28 DIAGNOSIS — I10 Essential (primary) hypertension: Secondary | ICD-10-CM | POA: Diagnosis not present

## 2016-11-28 DIAGNOSIS — R042 Hemoptysis: Secondary | ICD-10-CM | POA: Diagnosis not present

## 2016-11-28 DIAGNOSIS — R000 Tachycardia, unspecified: Secondary | ICD-10-CM | POA: Diagnosis not present

## 2016-11-28 LAB — CBC WITH DIFFERENTIAL/PLATELET
BASOS PCT: 0 %
Basophils Absolute: 0 10*3/uL (ref 0.0–0.1)
EOS ABS: 0 10*3/uL (ref 0.0–0.7)
Eosinophils Relative: 0 %
HCT: 40.6 % (ref 36.0–46.0)
HEMOGLOBIN: 13.4 g/dL (ref 12.0–15.0)
Lymphocytes Relative: 5 %
Lymphs Abs: 0.5 10*3/uL — ABNORMAL LOW (ref 0.7–4.0)
MCH: 29.7 pg (ref 26.0–34.0)
MCHC: 33 g/dL (ref 30.0–36.0)
MCV: 90 fL (ref 78.0–100.0)
MONOS PCT: 5 %
Monocytes Absolute: 0.5 10*3/uL (ref 0.1–1.0)
NEUTROS PCT: 90 %
Neutro Abs: 9.5 10*3/uL — ABNORMAL HIGH (ref 1.7–7.7)
PLATELETS: 231 10*3/uL (ref 150–400)
RBC: 4.51 MIL/uL (ref 3.87–5.11)
RDW: 13.6 % (ref 11.5–15.5)
WBC: 10.6 10*3/uL — ABNORMAL HIGH (ref 4.0–10.5)

## 2016-11-28 LAB — COMPREHENSIVE METABOLIC PANEL
ALK PHOS: 48 U/L (ref 38–126)
ALT: 23 U/L (ref 14–54)
AST: 23 U/L (ref 15–41)
Albumin: 3.7 g/dL (ref 3.5–5.0)
Anion gap: 11 (ref 5–15)
BUN: 12 mg/dL (ref 6–20)
CALCIUM: 8.5 mg/dL — AB (ref 8.9–10.3)
CO2: 24 mmol/L (ref 22–32)
CREATININE: 0.82 mg/dL (ref 0.44–1.00)
Chloride: 103 mmol/L (ref 101–111)
Glucose, Bld: 102 mg/dL — ABNORMAL HIGH (ref 65–99)
Potassium: 3.5 mmol/L (ref 3.5–5.1)
SODIUM: 138 mmol/L (ref 135–145)
Total Bilirubin: 0.8 mg/dL (ref 0.3–1.2)
Total Protein: 6.3 g/dL — ABNORMAL LOW (ref 6.5–8.1)

## 2016-11-28 LAB — I-STAT TROPONIN, ED: TROPONIN I, POC: 0 ng/mL (ref 0.00–0.08)

## 2016-11-28 MED ORDER — AMOXICILLIN-POT CLAVULANATE 875-125 MG PO TABS: 1.0000 | ORAL_TABLET | Freq: Two times a day (BID) | ORAL | 0 refills | Status: DC

## 2016-11-28 MED ORDER — IOPAMIDOL (ISOVUE-370) INJECTION 76%
100.0000 mL | Freq: Once | INTRAVENOUS | Status: AC | PRN
  Administered 2016-11-28: 87.9 mL via INTRAVENOUS

## 2016-11-28 MED ORDER — SODIUM CHLORIDE 0.9 % IV BOLUS (SEPSIS)
500.0000 mL | Freq: Once | INTRAVENOUS | Status: AC
  Administered 2016-11-28: 500 mL via INTRAVENOUS

## 2016-11-28 MED ORDER — AMOXICILLIN-POT CLAVULANATE 875-125 MG PO TABS
1.0000 | ORAL_TABLET | Freq: Once | ORAL | Status: AC
  Administered 2016-11-28: 1 via ORAL
  Filled 2016-11-28: qty 1

## 2016-11-28 MED ORDER — IOPAMIDOL (ISOVUE-370) INJECTION 76%
INTRAVENOUS | Status: AC
  Filled 2016-11-28: qty 100

## 2016-11-28 MED ORDER — HYDROCODONE-ACETAMINOPHEN 5-325 MG PO TABS
1.0000 | ORAL_TABLET | Freq: Once | ORAL | Status: AC
  Administered 2016-11-28: 1 via ORAL
  Filled 2016-11-28: qty 1

## 2016-11-28 NOTE — ED Triage Notes (Signed)
Per EMS, patient had endoscopy done this morning at [facility]. Patient got home a felt nausea and stated she spit up small amount of blood. Abdomen is soft/non-tender.

## 2016-11-28 NOTE — ED Provider Notes (Signed)
[facility] DEPT Provider Note   CSN: [id number] Arrival date & time: 11/28/16  1403     History   Chief Complaint Chief Complaint  Patient presents with  . Hemoptysis    HPI [name] is a 56 y.o. female.  The history is provided by the patient. No language interpreter was used.    [name] is a 56 y.o. female who presents to the Emergency Department complaining of hemoptysis.  She presents for evaluation of hemoptysis starting today. She had an endoscopy performed earlier today to remove gastric Bilinski. The procedure went well and she was discharged home. Between 12:30 and 1 she developed severe pain in her low back radiating down both legs. Around the same time she developed shortness of breath and cough with productive of clear sputum with a small amount of blood mixed in. She has no abdominal pain, nausea, vomiting, nosebleeds. She takes a baby aspirin daily as well as premarin. She denies any leg swelling. She endorses associated pain with breathing.  Past Medical History:  Diagnosis Date  . Duodenitis without hemorrhage   . Esophageal stricture   . Esophagitis   . GERD (gastroesophageal reflux disease)   . Hiatal hernia   . Hyperlipidemia   . Hypertension   . Hypothyroidism   . Leiomyoma of uterus, unspecified    fibroids    Patient Active Problem List   Diagnosis Date Noted  . Morbid obesity ([place]) 09/16/2012  . HYPERLIPIDEMIA 09/06/2008  . UTI 09/06/2008  . FIBROMYALGIA 09/06/2008  . HEADACHE, CHRONIC 09/06/2008  . FIBROIDS, UTERUS 09/01/2008  . HYPOTHYROIDISM 09/01/2008  . GERD 09/01/2008    Past Surgical History:  Procedure Laterality Date  . ABDOMINAL HYSTERECTOMY    . CHOLECYSTECTOMY    . cortisone injection  10/03/14   right shoulder  . HYSTEROSCOPY W/ ENDOMETRIAL ABLATION  4/05  . RECTOCELE REPAIR    . TONSILLECTOMY    . TOTAL VAGINAL HYSTERECTOMY  5/07  . TUBAL LIGATION     BILATERAL  . UPPER GASTROINTESTINAL ENDOSCOPY      OB  History    Gravida Para Term Preterm AB Living   3 3 3  0 0 3   SAB TAB Ectopic Multiple Live Births   0 0 0 0 3       Home Medications    Prior to Admission medications   Medication Sig Start Date End Date Taking? Authorizing Provider  acyclovir (ZOVIRAX) 400 MG tablet Take 1 tablet (400 mg total) by mouth 2 (two) times daily. 12/19/15  Yes Megan Salon, MD  aspirin 81 MG tablet Take 81 mg by mouth daily.   Yes Historical Provider, MD  Cholecalciferol (VITAMIN D) 2000 units tablet Take 2,000 Units by mouth 2 (two) times daily.   Yes Historical Provider, MD  cyclobenzaprine (FLEXERIL) 10 MG tablet Take 5 mg by mouth at bedtime as needed for muscle spasms. Takes 5mg  qhs    Yes Historical Provider, MD  DULoxetine (CYMBALTA) 30 MG capsule Take 30 mg by mouth daily.   Yes Historical Provider, MD  estrogens, conjugated, (PREMARIN) 0.9 MG tablet Take 1 tablet (0.9 mg total) by mouth daily. 12/19/15  Yes Megan Salon, MD  levothyroxine (SYNTHROID, LEVOTHROID) 75 MCG tablet Take 37.5 mcg by mouth daily. Takes half a tablet on Tuesdays.   Yes Historical Provider, MD  loratadine (CLARITIN) 10 MG tablet Take 10 mg by mouth daily as needed for allergies.   Yes Historical Provider, MD  losartan (COZAAR)  100 MG tablet Take 100 mg by mouth daily.   Yes Historical Provider, MD  Multiple Vitamin (MULTIVITAMIN) capsule Take 1 capsule by mouth daily.   Yes Historical Provider, MD  omeprazole (PRILOSEC) 40 MG capsule Take 1 capsule (40 mg total) by mouth 2 (two) times daily. 09/14/12  Yes Irene Shipper, MD  pramipexole (MIRAPEX) 0.25 MG tablet Take 0.25 mg by mouth at bedtime.  09/15/14  Yes Historical Provider, MD  rosuvastatin (CRESTOR) 10 MG tablet Take 10 mg by mouth daily.   Yes Historical Provider, MD  SAXENDA 18 MG/3ML SOPN Inject 3 mLs into the skin daily. 10/07/16  Yes Historical Provider, MD  vitamin B-12 (CYANOCOBALAMIN) 100 MCG tablet Take 100 mcg by mouth daily.   Yes Historical Provider, MD    amoxicillin-clavulanate (AUGMENTIN) 875-125 MG tablet Take 1 tablet by mouth every 12 (twelve) hours. 11/28/16   Quintella Reichert, MD    Family History Family History  Problem Relation Age of Onset  . Breast cancer      grandmother  . Celiac disease Mother   . Heart disease Father   . Stroke Father   . Irritable bowel syndrome Sister   . Colon cancer Neg Hx   . Cancer Maternal Grandmother     lung  . Cancer Paternal Grandmother     breast  . Osteoporosis Mother     Social History Social History  Substance Use Topics  . Smoking status: Former Smoker    Quit date: 12/23/2002  . Smokeless tobacco: Never Used  . Alcohol use No     Allergies   Patient has no known allergies.   Review of Systems Review of Systems  All other systems reviewed and are negative.    Physical Exam Updated Vital Signs BP 103/67   Pulse 113   Temp 98.9 F (37.2 C) (Oral)   Resp 19   LMP 11/25/2005   SpO2 93%   Physical Exam  Constitutional: She is oriented to person, place, and time. She appears well-developed and well-nourished.  HENT:  Head: Normocephalic and atraumatic.  No blood in naris. One small petechiae in the right posterior oropharynx.  Cardiovascular: Regular rhythm.   No murmur heard. Tachycardiac  Pulmonary/Chest: Effort normal and breath sounds normal. No respiratory distress.  Abdominal: Soft. There is no tenderness. There is no rebound and no guarding.  Musculoskeletal: She exhibits no edema.  2+ DP pulses bilaterally, tenderness to legs anteriorly over the shins bilaterally  Neurological: She is alert and oriented to person, place, and time.  5 out of 5 strength in all 4 extremities.   Skin: Skin is warm and dry.  Psychiatric: She has a normal mood and affect. Her behavior is normal.  Nursing note and vitals reviewed.    ED Treatments / Results  Labs (all labs ordered are listed, but only abnormal results are displayed) Labs Reviewed  COMPREHENSIVE METABOLIC  PANEL - Abnormal; Notable for the following:       Result Value   Glucose, Bld 102 (*)    Calcium 8.5 (*)    Total Protein 6.3 (*)    All other components within normal limits  CBC WITH DIFFERENTIAL/PLATELET - Abnormal; Notable for the following:    WBC 10.6 (*)    Neutro Abs 9.5 (*)    Lymphs Abs 0.5 (*)    All other components within normal limits  Randolm Idol, ED    EKG  EKG Interpretation  Date/Time:  Thursday November 28 2016 15:36:34 EST  Ventricular Rate:  116 PR Interval:    QRS Duration: 149 QT Interval:  349 QTC Calculation: 485 R Axis:   68 Text Interpretation:  Sinus tachycardia Right bundle branch block Confirmed by Hazle Coca [phone]) on 11/28/2016 3:44:10 PM       Radiology Ct Angio Chest Pe W/cm &/or Wo Cm  Result Date: 11/28/2016 CLINICAL DATA:  Nausea and hemoptysis following endoscopy this morning, history GERD, esophagitis, hiatal hernia, duodenitis, hypertension, former smoker EXAM: CT ANGIOGRAPHY CHEST WITH CONTRAST TECHNIQUE: Multidetector CT imaging of the chest was performed using the standard protocol during bolus administration of intravenous contrast. Multiplanar CT image reconstructions and MIPs were obtained to evaluate the vascular anatomy. CONTRAST:  88 cc Isovue 370 IV COMPARISON:  None FINDINGS: Cardiovascular: Aorta normal caliber without aneurysm or dissection. Pulmonary arteries adequately opacified and patent. No evidence of pulmonary embolism. No pericardial effusion. Mediastinum/Nodes: Small amount of fluid and air throughout the esophagus. Small hiatal hernia. No mediastinal adenopathy. Visualized base of cervical region normal appearance. Lungs/Pleura: Patchy airspace infiltrates throughout RIGHT upper lobe, less in RIGHT middle and RIGHT lower lobes, could represent pneumonia, aspiration, or pulmonary hemorrhage. Dependent atelectasis in the posterior lungs bilaterally. LEFT lung otherwise clear. No pleural effusion or pneumothorax. No definite  pulmonary mass or nodule are identified though small nodules and masses could be obscured within the observed RIGHT lung infiltrates. Upper Abdomen: 13 x 10 mm diameter low-attenuation lesion at RIGHT lobe liver, larger than the 9 mm maximum size seen on prior abdominal CT, question small cyst. Gallbladder surgically absent. Remaining visualized upper abdomen unremarkable. Musculoskeletal: No acute osseous findings. Review of the MIP images confirms the above findings. IMPRESSION: No evidence of pulmonary embolism. Extensive airspace infiltrates RIGHT lung greatest in RIGHT upper lobe, could represent aspiration, pneumonia, or pulmonary hemorrhage. Bibasilar atelectasis. Small hiatal hernia. Potential small hepatic cyst. Electronically Signed   By: Lavonia Dana M.D.   On: 11/28/2016 17:22    Procedures Procedures (including critical care time)  Medications Ordered in ED Medications  HYDROcodone-acetaminophen (NORCO/VICODIN) 5-325 MG per tablet 1 tablet (1 tablet Oral Given 11/28/16 1539)  sodium chloride 0.9 % bolus 500 mL (0 mLs Intravenous Stopped 11/28/16 1822)  iopamidol (ISOVUE-370) 76 % injection 100 mL (87.9 mLs Intravenous Contrast Given 11/28/16 1706)  amoxicillin-clavulanate (AUGMENTIN) 875-125 MG per tablet 1 tablet (1 tablet Oral Given 11/28/16 1822)     Initial Impression / Assessment and Plan / ED Course  I have reviewed the triage vital signs and the nursing notes.  Pertinent labs & imaging results that were available during my care of the patient were reviewed by me and considered in my medical decision making (see chart for details).  Clinical Course     Pt here for evaluation of pleuritis chest pain, small volume hemoptysis following endoscopy procedure.  No evidence of source of bleeding from OP.  Given sxs and premarin use there was concern for potential PE.  CTPE study obtained with no evidence of PE but does contain infiltrates.  Based on sxs, hx and CT study concern for  aspiration pneumonitis.  Will treat with Augmentin.  D/w pt findings of studies.  She has no respiratory distress in ED.  Plan to d/c home with close outpatient follow up as well as return precautions for worsening or worrisome symptoms.    Final Clinical Impressions(s) / ED Diagnoses   Final diagnoses:  Aspiration pneumonitis [facility])    New Prescriptions Discharge Medication List as of 11/28/2016  6:19 PM  START taking these medications   Details  amoxicillin-clavulanate (AUGMENTIN) 875-125 MG tablet Take 1 tablet by mouth every 12 (twelve) hours., Starting Thu 11/28/2016, Print         Quintella Reichert, MD 11/29/16 [phone]

## 2016-11-28 NOTE — ED Notes (Signed)
Bed: [id number] Expected date:  Expected time:  Means of arrival:  Comments: EMS - 55yo, hemoptysis after Endoscopy

## 2016-11-29 MED FILL — AMOX-CLAV 875-125 MG TABLET: 875-125 | 10 days supply | Qty: 20 | Fill #0

## 2016-12-02 DIAGNOSIS — Z6830 Body mass index (BMI) 30.0-30.9, adult: Secondary | ICD-10-CM | POA: Diagnosis not present

## 2016-12-02 DIAGNOSIS — J189 Pneumonia, unspecified organism: Secondary | ICD-10-CM | POA: Diagnosis not present

## 2016-12-02 DIAGNOSIS — J690 Pneumonitis due to inhalation of food and vomit: Secondary | ICD-10-CM | POA: Diagnosis not present

## 2016-12-03 MED FILL — DULoxetine HCL 30 MG CPEP: 30 | 30 days supply | Qty: 30 | Fill #6

## 2016-12-04 ENCOUNTER — Encounter: Payer: Self-pay | Admitting: Internal Medicine

## 2017-01-03 MED FILL — SAXENDA 18 MG/3 ML PEN: 18 | 30 days supply | Qty: 15 | Fill #1

## 2017-01-03 MED FILL — LOSARTAN POTASSIUM 100 MG T: 100 | 90 days supply | Qty: 90 | Fill #0

## 2017-01-03 MED FILL — DULoxetine HCL 30 MG CPEP: 30 | 30 days supply | Qty: 30 | Fill #7

## 2017-01-03 MED FILL — OMEPRAZOLE DR 40 MG CAPSULE: 40 | 90 days supply | Qty: 180 | Fill #1

## 2017-01-10 DIAGNOSIS — M7551 Bursitis of right shoulder: Secondary | ICD-10-CM | POA: Diagnosis not present

## 2017-01-15 DIAGNOSIS — E784 Other hyperlipidemia: Secondary | ICD-10-CM | POA: Diagnosis not present

## 2017-01-15 DIAGNOSIS — J189 Pneumonia, unspecified organism: Secondary | ICD-10-CM | POA: Diagnosis not present

## 2017-01-15 DIAGNOSIS — Z0000 Encounter for general adult medical examination without abnormal findings: Secondary | ICD-10-CM | POA: Diagnosis not present

## 2017-01-22 DIAGNOSIS — M4800 Spinal stenosis, site unspecified: Secondary | ICD-10-CM | POA: Diagnosis not present

## 2017-01-22 DIAGNOSIS — J690 Pneumonitis due to inhalation of food and vomit: Secondary | ICD-10-CM | POA: Diagnosis not present

## 2017-01-22 DIAGNOSIS — K219 Gastro-esophageal reflux disease without esophagitis: Secondary | ICD-10-CM | POA: Diagnosis not present

## 2017-01-22 DIAGNOSIS — G5600 Carpal tunnel syndrome, unspecified upper limb: Secondary | ICD-10-CM | POA: Diagnosis not present

## 2017-01-22 DIAGNOSIS — Z1389 Encounter for screening for other disorder: Secondary | ICD-10-CM | POA: Diagnosis not present

## 2017-01-22 DIAGNOSIS — M797 Fibromyalgia: Secondary | ICD-10-CM | POA: Diagnosis not present

## 2017-01-22 DIAGNOSIS — Z23 Encounter for immunization: Secondary | ICD-10-CM | POA: Diagnosis not present

## 2017-01-22 DIAGNOSIS — M5380 Other specified dorsopathies, site unspecified: Secondary | ICD-10-CM | POA: Diagnosis not present

## 2017-01-22 DIAGNOSIS — R945 Abnormal results of liver function studies: Secondary | ICD-10-CM | POA: Diagnosis not present

## 2017-01-22 DIAGNOSIS — Z0000 Encounter for general adult medical examination without abnormal findings: Secondary | ICD-10-CM | POA: Diagnosis not present

## 2017-01-22 DIAGNOSIS — N951 Menopausal and female climacteric states: Secondary | ICD-10-CM | POA: Diagnosis not present

## 2017-02-06 ENCOUNTER — Other Ambulatory Visit: Payer: Self-pay

## 2017-02-06 MED ORDER — ESTROGENS CONJUGATED 0.9 MG PO TABS: 0.9000 mg | ORAL_TABLET | Freq: Every day | ORAL | 0 refills | Status: DC

## 2017-02-06 MED FILL — DULoxetine HCL 30 MG CPEP: 30 | 30 days supply | Qty: 30 | Fill #8

## 2017-02-06 NOTE — Telephone Encounter (Signed)
Medication refill request: premarin 0.9mg  Last AEX:  12-19-15 Next AEX: 04-10-17 Last MMG (if hormonal medication request): 08-07-16 category b density birads 1:neg Refill authorized: 56mth supply with 0 refills to last until aex

## 2017-02-07 MED FILL — PREMARIN 0.9 MG TABLET: 0.9 | 90 days supply | Qty: 90 | Fill #0

## 2017-02-14 ENCOUNTER — Other Ambulatory Visit: Payer: Self-pay | Admitting: *Deleted

## 2017-02-14 MED ORDER — ACYCLOVIR 400 MG PO TABS: 400.0000 mg | ORAL_TABLET | Freq: Two times a day (BID) | ORAL | 0 refills | Status: DC

## 2017-02-14 MED FILL — LEVOTHYROXINE 75 MCG TABLET: 75 | 90 days supply | Qty: 90 | Fill #3

## 2017-02-14 MED FILL — ACYCLOVIR 400 MG TABLET: 400 | 90 days supply | Qty: 180 | Fill #0

## 2017-02-14 MED FILL — SAXENDA 18 MG/3 ML PEN: 18 | 30 days supply | Qty: 15 | Fill #2

## 2017-02-14 NOTE — Telephone Encounter (Signed)
Medication refill request: acyclovir  Last AEX:  12/19/15 SM Next AEX: 04/10/17 SM Last MMG (if hormonal medication request): 08/07/16 BIRADS1:neg  Refill authorized: 12/19/15 #180tabs/4R. Today #180/0R?

## 2017-02-25 MED FILL — PRAMIPEXOLE 0.25 MG TABLET: 0.25 | 60 days supply | Qty: 60 | Fill #4

## 2017-03-06 MED FILL — DULoxetine HCL 30 MG CPEP: 30 | 30 days supply | Qty: 30 | Fill #9

## 2017-04-01 MED FILL — OMEPRAZOLE DR 40 MG CAPSULE: 40 | 90 days supply | Qty: 180 | Fill #2

## 2017-04-01 MED FILL — SHINGRIX 50 MCG SUS: 50 | 30 days supply | Qty: 1 | Fill #0

## 2017-04-01 MED FILL — DULoxetine HCL 30 MG CPEP: 30 | 30 days supply | Qty: 30 | Fill #10

## 2017-04-04 MED FILL — LOSARTAN POTASSIUM 100 MG T: 100 | 90 days supply | Qty: 90 | Fill #1

## 2017-04-10 ENCOUNTER — Ambulatory Visit (INDEPENDENT_AMBULATORY_CARE_PROVIDER_SITE_OTHER): Payer: 59 | Admitting: Obstetrics & Gynecology

## 2017-04-10 ENCOUNTER — Other Ambulatory Visit (HOSPITAL_COMMUNITY)
Admission: RE | Admit: 2017-04-10 | Discharge: 2017-04-10 | Disposition: A | Discharge status: Home / Self Care | Payer: 59 | Source: Ambulatory Visit | Attending: Obstetrics & Gynecology | Admitting: Obstetrics & Gynecology

## 2017-04-10 ENCOUNTER — Encounter: Payer: Self-pay | Admitting: Obstetrics & Gynecology

## 2017-04-10 VITALS — BP 122/80 | HR 72 | Resp 12 | Ht 64.5 in | Wt 193.2 lb

## 2017-04-10 DIAGNOSIS — Z01419 Encounter for gynecological examination (general) (routine) without abnormal findings: Secondary | ICD-10-CM | POA: Insufficient documentation

## 2017-04-10 DIAGNOSIS — Z124 Encounter for screening for malignant neoplasm of cervix: Secondary | ICD-10-CM

## 2017-04-10 MED ORDER — ESTROGENS CONJUGATED 0.625 MG PO TABS: 0.6250 mg | ORAL_TABLET | Freq: Every day | ORAL | 4 refills | Status: DC

## 2017-04-10 MED FILL — PREMARIN 0.625 MG TABLET: 0.625 | 90 days supply | Qty: 90 | Fill #0

## 2017-04-10 NOTE — Progress Notes (Signed)
56 y.o. [id number] MarriedCaucasianF here for annual exam.  Had balloon procedure for weight loss.  After balloon was removed, she had aspiration pneumonia.  Dr. Joylene Draft is following her closely.  Has follow up chest x ray scheduled.  Was treated with antibiotics for 10 days.    Denies vaginal bleeding.  Doesn't feel the weight loss helped with incontinence.  It's not bothering her enough to proceed with any additional treatment or therapy/surgery at this point.    Patient's last menstrual period was 11/25/2005.          Sexually active: Yes.    The current method of family planning is status post hysterectomy.    Exercising: No.  The patient does not participate in regular exercise at present. Smoker:  no  Health Maintenance: Pap:  01/18/08 Normal  History of abnormal Pap:  Yes, LGSIL  MMG:  08/07/16 BIRADS1:neg  Colonoscopy:  01/09/12 [facility]. F/u 5 years.  Pt aware this is due. BMD:   Never  TDaP:  2016  Pneumonia vaccine(s):  2017 Shingrix:   04/03/2017 Hep C testing: PCP Screening Labs: PCP   reports that she quit smoking about 14 years ago. She has never used smokeless tobacco. She reports that she does not drink alcohol or use drugs.  Past Medical History:  Diagnosis Date  . Duodenitis without hemorrhage   . Esophageal stricture   . Esophagitis   . GERD (gastroesophageal reflux disease)   . Hiatal hernia   . Hyperlipidemia   . Hypertension   . Hypothyroidism   . Leiomyoma of uterus, unspecified    fibroids    Past Surgical History:  Procedure Laterality Date  . ABDOMINAL HYSTERECTOMY    . CHOLECYSTECTOMY    . cortisone injection  10/03/14   right shoulder  . HYSTEROSCOPY W/ ENDOMETRIAL ABLATION  4/05  . RECTOCELE REPAIR    . TONSILLECTOMY    . TOTAL VAGINAL HYSTERECTOMY  5/07  . TUBAL LIGATION     BILATERAL  . UPPER GASTROINTESTINAL ENDOSCOPY     Current Outpatient Prescriptions on File Prior to Visit  Medication Sig Dispense Refill  . acyclovir (ZOVIRAX) 400 MG  tablet Take 1 tablet (400 mg total) by mouth 2 (two) times daily. 180 tablet 0  . aspirin 81 MG tablet Take 81 mg by mouth daily.    . Cholecalciferol (VITAMIN D) 2000 units tablet Take 2,000 Units by mouth 2 (two) times daily.    . DULoxetine (CYMBALTA) 30 MG capsule Take 30 mg by mouth daily.    Marland Kitchen estrogens, conjugated, (PREMARIN) 0.9 MG tablet Take 1 tablet (0.9 mg total) by mouth daily. 90 tablet 4  . levothyroxine (SYNTHROID, LEVOTHROID) 75 MCG tablet Take 37.5 mcg by mouth daily. Takes half a tablet on Tuesdays.    Marland Kitchen loratadine (CLARITIN) 10 MG tablet Take 10 mg by mouth daily as needed for allergies.    Marland Kitchen losartan (COZAAR) 100 MG tablet Take 100 mg by mouth daily.    . Multiple Vitamin (MULTIVITAMIN) capsule Take 1 capsule by mouth daily.    Marland Kitchen omeprazole (PRILOSEC) 40 MG capsule Take 1 capsule (40 mg total) by mouth 2 (two) times daily. 180 capsule 3  . pramipexole (MIRAPEX) 0.25 MG tablet Take 0.25 mg by mouth at bedtime.   4  . rosuvastatin (CRESTOR) 10 MG tablet Take 10 mg by mouth daily.    Marland Kitchen SAXENDA 18 MG/3ML SOPN Inject 3 mLs into the skin daily.  3  . vitamin B-12 (CYANOCOBALAMIN) 100 MCG tablet  Take 100 mcg by mouth daily.     No current facility-administered medications on file prior to visit.     Family History  Problem Relation Age of Onset  . Breast cancer Unknown        grandmother  . Celiac disease Mother   . Heart disease Father   . Stroke Father   . Irritable bowel syndrome Sister   . Colon cancer Neg Hx   . Cancer Maternal Grandmother        lung  . Cancer Paternal Grandmother        breast  . Osteoporosis Mother     ROS:  Pertinent items are noted in HPI.  Otherwise, a comprehensive ROS was negative.  Exam:   BP 122/80 (BP Location: Right Arm, Patient Position: Sitting, Cuff Size: Normal)   Pulse 72   Resp 12   Ht 5' 4.5" (1.638 m)   Wt 193 lb 3.2 oz (87.6 kg)   LMP 11/25/2005   BMI 32.65 kg/m   Weight change: -8#  Height: 5' 4.5" (163.8 cm)  Ht  Readings from Last 3 Encounters:  04/10/17 5' 4.5" (1.638 m)  12/19/15 5' 4.75" (1.645 m)  10/04/14 5' 4.5" (1.638 m)    General appearance: alert, cooperative and appears stated age Head: Normocephalic, without obvious abnormality, atraumatic Neck: no adenopathy, supple, symmetrical, trachea midline and thyroid normal to inspection and palpation Lungs: clear to auscultation bilaterally Breasts: normal appearance, no masses or tenderness Heart: regular rate and rhythm Abdomen: soft, non-tender; bowel sounds normal; no masses,  no organomegaly Extremities: extremities normal, atraumatic, no cyanosis or edema Skin: Skin color, texture, turgor normal. No rashes or lesions Lymph nodes: Cervical, supraclavicular, and axillary nodes normal. No abnormal inguinal nodes palpated Neurologic: Grossly normal   Pelvic: External genitalia:  no lesions              Urethra:  normal appearing urethra with no masses, tenderness or lesions              Bartholins and Skenes: normal                 Vagina: normal appearing vagina with normal color and discharge, no lesions              Cervix: absent              Pap taken: Yes.   Bimanual Exam:  Uterus:  uterus absent              Adnexa: normal adnexa and no mass, fullness, tenderness               Rectovaginal: Confirms               Anus:  normal sphincter tone, no lesions  Chaperone was present for exam.  A:  Well Woman with normal exam H/O TVH, on HRT (lowering dosage today) Fibromyalgia Hypothyroidism GERD Urinary incontinence H/O HSV 1 Cystocele Recent aspiration pneumonia, being followed by Dr. Joylene Draft for this now  P:   Mammogram guidelines pap smear obtained today Change premarin to 0.625mg  daily.  #90/4RF Pharmacy will notify me when acyclovir rx is needed Lab work done with Dr. Joylene Draft.  She will check about the Hep C test Return annually or prn

## 2017-04-10 NOTE — Patient Instructions (Signed)
Please make sure you've had a Hep C test done with Dr. Merryl Hacker

## 2017-04-11 LAB — CYTOLOGY - PAP
Diagnosis: NEGATIVE
HPV: NOT DETECTED

## 2017-04-28 MED FILL — PRAMIPEXOLE 0.25 MG TABLET: 0.25 | 90 days supply | Qty: 90 | Fill #0

## 2017-05-07 MED FILL — DULoxetine HCL 30 MG CPEP: 30 | 30 days supply | Qty: 30 | Fill #11

## 2017-05-22 MED FILL — LEVOTHYROXINE 75 MCG TABLET: 75 | 90 days supply | Qty: 90 | Fill #0

## 2017-05-23 DIAGNOSIS — J189 Pneumonia, unspecified organism: Secondary | ICD-10-CM | POA: Diagnosis not present

## 2017-06-02 MED FILL — SHINGRIX 50 MCG SUS: 50 | 30 days supply | Qty: 1 | Fill #1

## 2017-06-06 MED FILL — DULoxetine HCL 30 MG CPEP: 30 | 30 days supply | Qty: 30 | Fill #0

## 2017-06-23 MED FILL — OMEPRAZOLE DR 40 MG CAPSULE: 40 | 90 days supply | Qty: 180 | Fill #3

## 2017-07-07 MED FILL — DULoxetine HCL 30 MG CPEP: 30 | 30 days supply | Qty: 30 | Fill #1

## 2017-07-07 MED FILL — LOSARTAN POTASSIUM 100 MG T: 100 | 90 days supply | Qty: 90 | Fill #2

## 2017-07-24 MED FILL — PRAMIPEXOLE 0.25 MG TABLET: 0.25 | 90 days supply | Qty: 90 | Fill #1

## 2017-07-29 ENCOUNTER — Encounter: Payer: Self-pay | Admitting: Internal Medicine

## 2017-08-06 MED FILL — PREMARIN 0.625 MG TABLET: 0.625 | 90 days supply | Qty: 90 | Fill #1

## 2017-08-06 MED FILL — DULoxetine HCL 30 MG CPEP: 30 | 30 days supply | Qty: 30 | Fill #2

## 2017-08-13 DIAGNOSIS — Z1231 Encounter for screening mammogram for malignant neoplasm of breast: Secondary | ICD-10-CM | POA: Diagnosis not present

## 2017-08-21 DIAGNOSIS — R921 Mammographic calcification found on diagnostic imaging of breast: Secondary | ICD-10-CM | POA: Diagnosis not present

## 2017-08-26 MED FILL — LEVOTHYROXINE 75 MCG TABLET: 75 | 90 days supply | Qty: 90 | Fill #1

## 2017-08-27 ENCOUNTER — Encounter (HOSPITAL_COMMUNITY): Payer: Self-pay

## 2017-09-05 ENCOUNTER — Other Ambulatory Visit: Payer: Self-pay | Admitting: Obstetrics & Gynecology

## 2017-09-05 MED FILL — DULoxetine HCL 30 MG CPEP: 30 | 30 days supply | Qty: 30 | Fill #3

## 2017-09-05 MED FILL — ACYCLOVIR 400 MG TABLET: 400 | 90 days supply | Qty: 180 | Fill #0

## 2017-09-05 NOTE — Telephone Encounter (Signed)
Medication refill request: acyclovir Last AEX:  04/10/17 SM Next AEX: 06/26/18 SM Last MMG (if hormonal medication request): 08/07/16 BIRADS1:neg  Refill authorized: 02/14/17 #[id number]. Today please advise.

## 2017-09-09 ENCOUNTER — Encounter: Payer: Self-pay | Admitting: Obstetrics & Gynecology

## 2017-09-22 MED FILL — OMEPRAZOLE DR 40 MG CAPSULE: 40 | 30 days supply | Qty: 60 | Fill #0

## 2017-09-26 ENCOUNTER — Ambulatory Visit (AMBULATORY_SURGERY_CENTER): Payer: Self-pay

## 2017-09-26 VITALS — Ht 65.0 in | Wt 202.0 lb

## 2017-09-26 DIAGNOSIS — Z86010 Personal history of colonic polyps: Secondary | ICD-10-CM

## 2017-09-26 MED ORDER — NA SULFATE-K SULFATE-MG SULF 17.5-3.13-1.6 GM/177ML PO SOLN: ORAL | 0 refills | Status: DC

## 2017-09-26 MED FILL — SUPREP BOWEL PREP KIT: 17.5-3.13-1 | 1 days supply | Qty: 354 | Fill #0

## 2017-09-26 NOTE — Progress Notes (Signed)
Per pt, no allergies to soy or egg products.Pt not taking any weight loss meds or using  O2 at home.   Pt refused Emmi video. 

## 2017-09-29 MED FILL — DULoxetine HCL 30 MG CPEP: 30 | 30 days supply | Qty: 30 | Fill #4

## 2017-10-01 ENCOUNTER — Encounter: Payer: Self-pay | Admitting: Internal Medicine

## 2017-10-10 ENCOUNTER — Encounter: Payer: Self-pay | Admitting: Internal Medicine

## 2017-10-10 ENCOUNTER — Other Ambulatory Visit: Payer: Self-pay

## 2017-10-10 ENCOUNTER — Ambulatory Visit (AMBULATORY_SURGERY_CENTER): Payer: 59 | Admitting: Internal Medicine

## 2017-10-10 VITALS — BP 153/93 | HR 70 | Temp 98.9°F | Resp 20 | Ht 65.0 in | Wt 202.0 lb

## 2017-10-10 DIAGNOSIS — Z86010 Personal history of colonic polyps: Secondary | ICD-10-CM

## 2017-10-10 MED ORDER — SODIUM CHLORIDE 0.9 % IV SOLN: 500.0000 mL | INTRAVENOUS | Status: DC

## 2017-10-10 NOTE — Patient Instructions (Signed)
YOU HAD AN ENDOSCOPIC PROCEDURE TODAY AT [facility]:   Refer to the procedure report that was given to you for any specific questions about what was found during the examination.  If the procedure report does not answer your questions, please call your gastroenterologist to clarify.  If you requested that your care partner not be given the details of your procedure findings, then the procedure report has been included in a sealed envelope for you to review at your convenience later.  YOU SHOULD EXPECT: Some feelings of bloating in the abdomen. Passage of more gas than usual.  Walking can help get rid of the air that was put into your GI tract during the procedure and reduce the bloating. If you had a lower endoscopy (such as a colonoscopy or flexible sigmoidoscopy) you may notice spotting of blood in your stool or on the toilet paper. If you underwent a bowel prep for your procedure, you may not have a normal bowel movement for a few days.  Please Note:  You might notice some irritation and congestion in your nose or some drainage.  This is from the oxygen used during your procedure.  There is no need for concern and it should clear up in a day or so.  SYMPTOMS TO REPORT IMMEDIATELY:   Following lower endoscopy (colonoscopy or flexible sigmoidoscopy):  Excessive amounts of blood in the stool  Significant tenderness or worsening of abdominal pains  Swelling of the abdomen that is new, acute  Fever of 100F or higher   For urgent or emergent issues, a gastroenterologist can be reached at any hour by calling [phone].   DIET:  We do recommend a small meal at first, but then you may proceed to your regular diet.  Drink plenty of fluids but you should avoid alcoholic beverages for 24 hours.  ACTIVITY:  You should plan to take it easy for the rest of today and you should NOT DRIVE or use heavy machinery until tomorrow (because of the sedation medicines used during the test).     FOLLOW UP: Our staff will call the number listed on your records the next business day following your procedure to check on you and address any questions or concerns that you may have regarding the information given to you following your procedure. If we do not reach you, we will leave a message.  However, if you are feeling well and you are not experiencing any problems, there is no need to return our call.  We will assume that you have returned to your regular daily activities without incident.  If any biopsies were taken you will be contacted by phone or by letter within the next 1-3 weeks.  Please call us at [phone] if you have not heard about the biopsies in 3 weeks.    SIGNATURES/CONFIDENTIALITY: You and/or your care partner have signed paperwork which will be entered into your electronic medical record.  These signatures attest to the fact that that the information above on your After Visit Summary has been reviewed and is understood.  Full responsibility of the confidentiality of this discharge information lies with you and/or your care-partner.  Hemorrhoid information given.  Recall 10 years-2028

## 2017-10-10 NOTE — Progress Notes (Signed)
Pt's states no medical or surgical changes since previsit or office visit. 

## 2017-10-10 NOTE — Progress Notes (Signed)
Report to PACU, RN, vss, BBS= Clear.  

## 2017-10-10 NOTE — Op Note (Signed)
[facility] Patient Name: Antonisha Waskey Procedure Date: 10/10/2017 9:56 AM MRN: [id number] Endoscopist: Docia Chuck. Henrene Pastor , MD Age: 56 Referring MD:  Date of Birth: [birth year] Gender: Female Account #: [id number] Procedure:                Colonoscopy Indications:              High risk colon cancer surveillance: Personal                            history of non-advanced adenoma. Index exam                            February 2013 Medicines:                Monitored Anesthesia Care Procedure:                Pre-Anesthesia Assessment:                           - Prior to the procedure, a History and Physical                            was performed, and patient medications and                            allergies were reviewed. The patient's tolerance of                            previous anesthesia was also reviewed. The risks                            and benefits of the procedure and the sedation                            options and risks were discussed with the patient.                            All questions were answered, and informed consent                            was obtained. Prior Anticoagulants: The patient has                            taken no previous anticoagulant or antiplatelet                            agents. ASA Grade Assessment: II - A patient with                            mild systemic disease. After reviewing the risks                            and benefits, the patient was deemed in  satisfactory condition to undergo the procedure.                           After obtaining informed consent, the colonoscope                            was passed under direct vision. Throughout the                            procedure, the patient's blood pressure, pulse, and                            oxygen saturations were monitored continuously. The                            Colonoscope was introduced through the anus and                             advanced to the the cecum, identified by                            appendiceal orifice and ileocecal valve. The                            ileocecal valve, appendiceal orifice, and rectum                            were photographed. The quality of the bowel                            preparation was excellent. The colonoscopy was                            performed without difficulty. The patient tolerated                            the procedure well. The bowel preparation used was                            SUPREP. Scope In: 10:03:20 AM Scope Out: 10:15:01 AM Scope Withdrawal Time: 0 hours 9 minutes 37 seconds  Total Procedure Duration: 0 hours 11 minutes 41 seconds  Findings:                 Internal hemorrhoids were found during retroflexion.                           The exam was otherwise without abnormality on                            direct and retroflexion views. Complications:            No immediate complications. Estimated blood loss:                            None. Estimated Blood  Loss:     Estimated blood loss: none. Impression:               - Internal hemorrhoids.                           - The examination was otherwise normal on direct                            and retroflexion views.                           - No specimens collected. Recommendation:           - Repeat colonoscopy in 10 years for surveillance.                           - Patient has a contact number available for                            emergencies. The signs and symptoms of potential                            delayed complications were discussed with the                            patient. Return to normal activities tomorrow.                            Written discharge instructions were provided to the                            patient.                           - Resume previous diet.                           - Continue present medications. Docia Chuck. Henrene Pastor,  MD 10/10/2017 10:19:17 AM This report has been signed electronically.

## 2017-10-13 ENCOUNTER — Telehealth: Payer: Self-pay | Admitting: *Deleted

## 2017-10-13 MED FILL — LOSARTAN POTASSIUM 100 MG T: 100 | 90 days supply | Qty: 90 | Fill #3

## 2017-10-13 NOTE — Telephone Encounter (Signed)
No answer, second call.  Left message to call if questions or concerns. 

## 2017-10-13 NOTE — Telephone Encounter (Signed)
No answer, left message to call if questions or concerns. 

## 2017-10-21 MED FILL — PRAMIPEXOLE 0.25 MG TABLET: 0.25 | 90 days supply | Qty: 90 | Fill #2

## 2017-10-21 MED FILL — OMEPRAZOLE DR 40 MG CAPSULE: 40 | 30 days supply | Qty: 60 | Fill #1

## 2017-11-05 MED FILL — DULoxetine HCL 30 MG CPEP: 30 | 30 days supply | Qty: 30 | Fill #5

## 2017-11-05 MED FILL — PREMARIN 0.625 MG TABLET: 0.625 | 90 days supply | Qty: 90 | Fill #2

## 2017-11-21 MED FILL — OMEPRAZOLE DR 40 MG CAPSULE: 40 | 30 days supply | Qty: 60 | Fill #2

## 2017-12-02 MED FILL — DULoxetine HCL 30 MG CPEP: 30 | 30 days supply | Qty: 30 | Fill #6

## 2017-12-02 MED FILL — LEVOTHYROXINE 75 MCG TABLET: 75 | 90 days supply | Qty: 90 | Fill #2

## 2017-12-15 MED FILL — OMEPRAZOLE DR 40 MG CAPSULE: 40 | 30 days supply | Qty: 60 | Fill #3

## 2018-01-05 MED FILL — DULoxetine HCL 30 MG CPEP: 30 | 90 days supply | Qty: 90 | Fill #0

## 2018-01-05 MED FILL — LOSARTAN POTASSIUM 100 MG T: 100 | 90 days supply | Qty: 90 | Fill #0

## 2018-01-14 MED FILL — OMEPRAZOLE DR 40 MG CAPSULE: 40 | 30 days supply | Qty: 60 | Fill #4

## 2018-01-20 MED FILL — PRAMIPEXOLE 0.25 MG TABLET: 0.25 | 90 days supply | Qty: 90 | Fill #3

## 2018-02-12 MED FILL — OMEPRAZOLE DR 40 MG CAPSULE: 40 | 30 days supply | Qty: 60 | Fill #5

## 2018-02-12 MED FILL — PREMARIN 0.625 MG TABLET: 0.625 | 90 days supply | Qty: 90 | Fill #3

## 2018-02-19 DIAGNOSIS — R921 Mammographic calcification found on diagnostic imaging of breast: Secondary | ICD-10-CM | POA: Diagnosis not present

## 2018-03-04 DIAGNOSIS — Z0000 Encounter for general adult medical examination without abnormal findings: Secondary | ICD-10-CM | POA: Diagnosis not present

## 2018-03-04 DIAGNOSIS — R82998 Other abnormal findings in urine: Secondary | ICD-10-CM | POA: Diagnosis not present

## 2018-03-04 DIAGNOSIS — I10 Essential (primary) hypertension: Secondary | ICD-10-CM | POA: Diagnosis not present

## 2018-03-10 ENCOUNTER — Encounter: Payer: Self-pay | Admitting: Obstetrics & Gynecology

## 2018-03-11 DIAGNOSIS — J690 Pneumonitis due to inhalation of food and vomit: Secondary | ICD-10-CM | POA: Diagnosis not present

## 2018-03-11 DIAGNOSIS — N951 Menopausal and female climacteric states: Secondary | ICD-10-CM | POA: Diagnosis not present

## 2018-03-11 DIAGNOSIS — M4800 Spinal stenosis, site unspecified: Secondary | ICD-10-CM | POA: Diagnosis not present

## 2018-03-11 DIAGNOSIS — M5380 Other specified dorsopathies, site unspecified: Secondary | ICD-10-CM | POA: Diagnosis not present

## 2018-03-11 DIAGNOSIS — K219 Gastro-esophageal reflux disease without esophagitis: Secondary | ICD-10-CM | POA: Diagnosis not present

## 2018-03-11 DIAGNOSIS — Z0000 Encounter for general adult medical examination without abnormal findings: Secondary | ICD-10-CM | POA: Diagnosis not present

## 2018-03-11 DIAGNOSIS — M797 Fibromyalgia: Secondary | ICD-10-CM | POA: Diagnosis not present

## 2018-03-11 DIAGNOSIS — Z1389 Encounter for screening for other disorder: Secondary | ICD-10-CM | POA: Diagnosis not present

## 2018-03-11 DIAGNOSIS — R945 Abnormal results of liver function studies: Secondary | ICD-10-CM | POA: Diagnosis not present

## 2018-03-11 DIAGNOSIS — G5600 Carpal tunnel syndrome, unspecified upper limb: Secondary | ICD-10-CM | POA: Diagnosis not present

## 2018-03-13 MED FILL — LEVOTHYROXINE 75 MCG TABLET: 75 | 90 days supply | Qty: 90 | Fill #3

## 2018-03-13 MED FILL — OMEPRAZOLE DR 40 MG CAPSULE: 40 | 30 days supply | Qty: 60 | Fill #6

## 2018-04-02 DIAGNOSIS — Z13820 Encounter for screening for osteoporosis: Secondary | ICD-10-CM | POA: Diagnosis not present

## 2018-04-10 MED FILL — OMEPRAZOLE DR 40 MG CAPSULE: 40 | 30 days supply | Qty: 60 | Fill #7

## 2018-04-10 MED FILL — DULoxetine HCL 30 MG CPEP: 30 | 90 days supply | Qty: 90 | Fill #1

## 2018-04-10 MED FILL — LOSARTAN POTASSIUM 100 MG T: 100 | 90 days supply | Qty: 90 | Fill #1

## 2018-04-21 MED FILL — PRAMIPEXOLE 0.25 MG TABLET: 0.25 | 60 days supply | Qty: 60 | Fill #4

## 2018-05-11 ENCOUNTER — Other Ambulatory Visit: Payer: Self-pay | Admitting: Obstetrics & Gynecology

## 2018-05-11 MED FILL — PREMARIN 0.625 MG TABLET: 0.625 | 90 days supply | Qty: 90 | Fill #0

## 2018-05-11 MED FILL — ACYCLOVIR 400 MG TABLET: 400 | 90 days supply | Qty: 180 | Fill #1

## 2018-05-11 MED FILL — OMEPRAZOLE DR 40 MG CAPSULE: 40 | 30 days supply | Qty: 60 | Fill #8

## 2018-05-11 NOTE — Telephone Encounter (Signed)
Medication refill request: Premarin  Last AEX:  04-10-17  Next AEX: 06-26-18 Last MMG (if hormonal medication request): 02-19-18 -F/U MMG in 6 months  Refill authorized: please advise

## 2018-06-09 MED FILL — OMEPRAZOLE DR 40 MG CAPSULE: 40 | 30 days supply | Qty: 60 | Fill #9

## 2018-06-16 MED FILL — PRAMIPEXOLE 0.125 MG TABLET: 0.125 | 90 days supply | Qty: 90 | Fill #0

## 2018-06-16 MED FILL — LEVOTHYROXINE 75 MCG TABLET: 75 | 90 days supply | Qty: 90 | Fill #0

## 2018-06-26 ENCOUNTER — Encounter: Payer: Self-pay | Admitting: Obstetrics & Gynecology

## 2018-06-26 ENCOUNTER — Ambulatory Visit (INDEPENDENT_AMBULATORY_CARE_PROVIDER_SITE_OTHER): Payer: 59 | Admitting: Obstetrics & Gynecology

## 2018-06-26 ENCOUNTER — Encounter

## 2018-06-26 VITALS — BP 128/86 | HR 96 | Resp 16 | Ht 64.5 in | Wt 205.2 lb

## 2018-06-26 DIAGNOSIS — Z01419 Encounter for gynecological examination (general) (routine) without abnormal findings: Secondary | ICD-10-CM | POA: Diagnosis not present

## 2018-06-26 MED ORDER — ESTROGENS CONJUGATED 0.625 MG PO TABS: 0.6250 mg | ORAL_TABLET | Freq: Every day | ORAL | 4 refills | Status: DC

## 2018-06-26 MED ORDER — ACYCLOVIR 400 MG PO TABS: 400.0000 mg | ORAL_TABLET | Freq: Two times a day (BID) | ORAL | 4 refills | Status: DC

## 2018-06-26 NOTE — Progress Notes (Signed)
57 y.o. [id number] MarriedCaucasianF here for annual exam.  Doing well.  No vaginal bleeding.  Has post void leakage.  Can leak without any control.  Wears Poise pads all of the time.  Having some increased urgency as well.  Also, feels "heavy" in the morning especially.  PCP:  Dr. Joylene Draft.  Saw him this summer.  Lab work was all ok.  Patient's last menstrual period was 11/25/2005.          Sexually active: Yes.    The current method of family planning is status post hysterectomy.    Exercising: No.  The patient does not participate in regular exercise at present. Smoker:  no  Health Maintenance: Pap:  04/10/17 Neg. HR HPV:neg  History of abnormal Pap:  Yes, LGSIL  MMG:  02/19/18 Diagnostic right BIRADS3:Probably benign. F/u 6 months.  Has follow-up scheduled in September. Colonoscopy:  10/10/17 Normal. F/u 10 years  BMD:   Done by PCP.  Dr. Joylene Draft. TDaP:  2016 Pneumonia vaccine(s):  2017 Shingrix: completed 2018 x 2.  Done a [facility]. Hep C testing: PCP Screening Labs: PCP   reports that she quit smoking about 15 years ago. She has never used smokeless tobacco. She reports that she drinks alcohol. She reports that she does not use drugs.  Past Medical History:  Diagnosis Date  . Anemia 2007   due to fibroids  . Duodenitis without hemorrhage   . Esophageal stricture   . Esophagitis   . GERD (gastroesophageal reflux disease)   . Hiatal hernia   . Hyperlipidemia   . Hypertension   . Hypothyroidism   . Leiomyoma of uterus, unspecified    fibroids  . Pneumonia    aspiration pneumonia after balloon surgery for weight loss    Past Surgical History:  Procedure Laterality Date  . CARPAL TUNNEL RELEASE  2014   right wrist  . CHOLECYSTECTOMY    . HYSTEROSCOPY W/ ENDOMETRIAL ABLATION  4/05  . RECTOCELE REPAIR  2007  . TONSILLECTOMY     as child  . TOTAL VAGINAL HYSTERECTOMY  5/07  . TUBAL LIGATION  1991   BILATERAL    Current Outpatient Medications  Medication  Sig Dispense Refill  . acyclovir (ZOVIRAX) 400 MG tablet Take 1 tablet (400 mg total) by mouth 2 (two) times daily. 180 tablet 4  . Cholecalciferol (VITAMIN D) 2000 units tablet Take 2,000 Units by mouth 2 (two) times daily.    . DULoxetine (CYMBALTA) 30 MG capsule Take 30 mg by mouth daily.    Marland Kitchen estrogens, conjugated, (PREMARIN) 0.625 MG tablet Take 1 tablet (0.625 mg total) by mouth daily. 90 tablet 4  . levothyroxine (SYNTHROID, LEVOTHROID) 75 MCG tablet Take 75 mcg by mouth daily. Takes half a tablet on Tuesdays.    Marland Kitchen losartan (COZAAR) 100 MG tablet Take 100 mg by mouth daily.    . Multiple Vitamin (MULTIVITAMIN) capsule Take 1 capsule by mouth daily.    Marland Kitchen omeprazole (PRILOSEC) 40 MG capsule Take 1 capsule (40 mg total) by mouth 2 (two) times daily. 180 capsule 3  . pramipexole (MIRAPEX) 0.125 MG tablet Take 1 tablet by mouth at bedtime.  3  . rosuvastatin (CRESTOR) 10 MG tablet Take 10 mg by mouth daily.    . vitamin B-12 (CYANOCOBALAMIN) 100 MCG tablet Take 100 mcg by mouth daily.     No current facility-administered medications for this visit.     Family History  Problem Relation Age of Onset  . Celiac disease  Mother   . Osteoporosis Mother   . Heart disease Father   . Stroke Father   . Breast cancer Unknown        grandmother  . Irritable bowel syndrome Sister   . Cancer Maternal Grandmother        lung  . Cancer Paternal Grandmother        breast  . Heart disease Sister   . Melanoma Sister   . Colon cancer Neg Hx   . Esophageal cancer Neg Hx   . Stomach cancer Neg Hx   . Rectal cancer Neg Hx     Review of Systems  Genitourinary:       Loss of sexual interest  Loss of urine spontaneously Loss of urine with sneeze or cough  Night urination    All other systems reviewed and are negative.   Exam:   BP 128/86 (BP Location: Right Arm, Patient Position: Sitting, Cuff Size: Large)   Pulse 96   Resp 16   Ht 5' 4.5" (1.638 m)   Wt 205 lb 3.2 oz (93.1 kg)   LMP  11/25/2005   BMI 34.68 kg/m    Height: 5' 4.5" (163.8 cm)  Ht Readings from Last 3 Encounters:  06/26/18 5' 4.5" (1.638 m)  10/10/17 5\' 5"  (1.651 m)  09/26/17 5\' 5"  (1.651 m)    General appearance: alert, cooperative and appears stated age Head: Normocephalic, without obvious abnormality, atraumatic Neck: no adenopathy, supple, symmetrical, trachea midline and thyroid normal to inspection and palpation Lungs: clear to auscultation bilaterally Breasts: normal appearance, no masses or tenderness Heart: regular rate and rhythm Abdomen: soft, non-tender; bowel sounds normal; no masses,  no organomegaly Extremities: extremities normal, atraumatic, no cyanosis or edema Skin: Skin color, texture, turgor normal. No rashes or lesions Lymph nodes: Cervical, supraclavicular, and axillary nodes normal. No abnormal inguinal nodes palpated Neurologic: Grossly normal   Pelvic: External genitalia:  no lesions              Urethra:  normal appearing urethra with no masses, tenderness or lesions              Bartholins and Skenes: normal                 Vagina: normal appearing vagina with normal color and discharge, no lesions, 3rd degree cystocele noted today, more prominent this year              Cervix: absent              Pap taken: No. Bimanual Exam:  Uterus:  uterus absent              Adnexa: no mass, fullness, tenderness               Rectovaginal: Confirms               Anus:  normal sphincter tone, no lesions  Chaperone was present for exam.  A:  Well Woman with normal exam PMP, on HRT H/O TVH Hypothyroidism GERD Fibromyalgia H/O HSV 1 Cystocele H/o aspiration pneumonia, has gotten both pneumonia vaccines  P:   Mammogram follow up due in September.  Has appt already scheduled pap smear not indicated Rx for acyclovir 400mg  bid for HSV prevention.  #180/4RF Premarin 0.625mg  daily.  #90/4RF  Colonoscopy screening UTD Treatment for cystocele discussed.  She is going to  consider surgery.  Saw Dr. Matilde Sprang years ago for the rectocele repair.  Would like to see  him again. return annually or prn

## 2018-07-06 MED FILL — OMEPRAZOLE 40 MG CPDR: 40 | 30 days supply | Qty: 60 | Fill #10

## 2018-07-16 MED FILL — DULoxetine HCL 30 MG CPEP: 30 | 90 days supply | Qty: 90 | Fill #0

## 2018-07-16 MED FILL — LOSARTAN POTASSIUM 100 MG T: 100 | 90 days supply | Qty: 90 | Fill #0

## 2018-08-10 MED FILL — OMEPRAZOLE 40 MG CPDR: 40 | 30 days supply | Qty: 60 | Fill #11

## 2018-08-13 MED FILL — PREMARIN 0.625 MG TABLET: 0.625 | 90 days supply | Qty: 90 | Fill #0

## 2018-08-28 DIAGNOSIS — Z1231 Encounter for screening mammogram for malignant neoplasm of breast: Secondary | ICD-10-CM | POA: Diagnosis not present

## 2018-09-03 MED FILL — PRAMIPEXOLE 0.125 MG TABLET: 0.125 | 90 days supply | Qty: 90 | Fill #1

## 2018-09-08 MED FILL — OMEPRAZOLE 40 MG CPDR: 40 | 90 days supply | Qty: 180 | Fill #0

## 2018-09-25 MED FILL — LEVOTHYROXINE 75 MCG TABLET: 75 | 90 days supply | Qty: 90 | Fill #1

## 2018-09-28 DIAGNOSIS — H524 Presbyopia: Secondary | ICD-10-CM | POA: Diagnosis not present

## 2018-09-28 DIAGNOSIS — H5213 Myopia, bilateral: Secondary | ICD-10-CM | POA: Diagnosis not present

## 2018-10-16 MED FILL — LOSARTAN POTASSIUM 100 MG T: 100 | 90 days supply | Qty: 90 | Fill #1

## 2018-10-16 MED FILL — DULoxetine HCL 30 MG CPEP: 30 | 90 days supply | Qty: 90 | Fill #1

## 2018-11-20 MED FILL — PREMARIN 0.625 MG TABLET: 0.625 | 90 days supply | Qty: 90 | Fill #1

## 2018-11-20 MED FILL — PRAMIPEXOLE 0.125 MG TABLET: 0.125 | 90 days supply | Qty: 90 | Fill #2

## 2018-12-01 MED FILL — OMEPRAZOLE 40 MG CPDR: 40 | 90 days supply | Qty: 180 | Fill #1

## 2019-01-01 MED FILL — LEVOTHYROXINE 75 MCG TABLET: 75 | 90 days supply | Qty: 90 | Fill #2

## 2019-01-13 MED FILL — LOSARTAN POTASSIUM 100 MG T: 100 | 90 days supply | Qty: 90 | Fill #0

## 2019-01-13 MED FILL — DULoxetine HCL 30 MG CPEP: 30 | 90 days supply | Qty: 90 | Fill #0

## 2019-02-15 ENCOUNTER — Other Ambulatory Visit: Payer: Self-pay | Admitting: *Deleted

## 2019-02-15 ENCOUNTER — Encounter: Payer: Self-pay | Admitting: Obstetrics & Gynecology

## 2019-02-15 MED ORDER — ACYCLOVIR 400 MG PO TABS: 400.0000 mg | ORAL_TABLET | Freq: Two times a day (BID) | ORAL | 2 refills | Status: DC

## 2019-02-15 MED ORDER — ESTROGENS CONJUGATED 0.625 MG PO TABS: 0.6250 mg | ORAL_TABLET | Freq: Every day | ORAL | 2 refills | Status: DC

## 2019-02-15 NOTE — Telephone Encounter (Signed)
Medication refill request: Premarin 0.625 mg take 1 tablet po daily, Acyclovir 400 mg take 1 tablet po BID Last AEX:  06/26/2018 Next AEX: 10/28/2019 with Dr.Miller Last MMG (if hormonal medication request): 08/28/2018 Birads 1 negative Refill authorized: Premarin 0.625 mg #90 2RF, Acyclovir 400 mg #180 2RF  Please refill if appropriate.

## 2019-02-15 NOTE — Telephone Encounter (Signed)
Hey I tried to fill my Premarin and they said something had changed with my insurance and they needed a new prescription. I also need my acyclovir refilled. [facility] is not taking scripts right now so you will have to send it to [facility] and they will mail it to me.   Thank you  Rosalio Loud

## 2019-02-16 MED FILL — ACYCLOVIR 400 MG TABLET: 400 | 90 days supply | Qty: 180 | Fill #0 | Status: TO

## 2019-02-16 MED FILL — PREMARIN 0.625 MG TABLET: 0.625 | 90 days supply | Qty: 90 | Fill #0 | Status: TO

## 2019-02-17 ENCOUNTER — Encounter: Payer: Self-pay | Admitting: Obstetrics & Gynecology

## 2019-02-18 ENCOUNTER — Other Ambulatory Visit: Payer: Self-pay | Admitting: Obstetrics & Gynecology

## 2019-02-18 NOTE — Telephone Encounter (Signed)
Refills have been sent for both Premarin cream and Acyclovir to [facility] patient pharmacy on 02/15/2019 by Dr.Jertson. Encounter closed.

## 2019-02-18 NOTE — Telephone Encounter (Signed)
Patient sent the following correspondence through [facility]. Routing to triage to assist patient with request.  Hey I tried to fill my Premarin and they said something had changed with my insurance and they needed a new prescription. I also need my acyclovir refilled. [facility] is not taking scripts right now so you will have to send it to [facility] and they will mail it to me.   Thank you   [name].

## 2019-02-20 MED FILL — PRAMIPEXOLE 0.125 MG TABLET: 0.125 | 90 days supply | Qty: 90 | Fill #0

## 2019-03-03 MED FILL — OMEPRAZOLE 40 MG CPDR: 40 | 90 days supply | Qty: 180 | Fill #0 | Status: TO

## 2019-03-20 ENCOUNTER — Encounter (HOSPITAL_COMMUNITY): Payer: Self-pay

## 2019-03-20 ENCOUNTER — Emergency Department (HOSPITAL_COMMUNITY): Payer: 59

## 2019-03-20 ENCOUNTER — Emergency Department (HOSPITAL_COMMUNITY)
Admission: EM | Admit: 2019-03-20 | Discharge: 2019-03-20 | Disposition: A | Discharge status: Home / Self Care | Payer: 59 | Attending: Emergency Medicine | Admitting: Emergency Medicine

## 2019-03-20 ENCOUNTER — Other Ambulatory Visit: Payer: Self-pay

## 2019-03-20 DIAGNOSIS — I10 Essential (primary) hypertension: Secondary | ICD-10-CM | POA: Diagnosis not present

## 2019-03-20 DIAGNOSIS — Z87891 Personal history of nicotine dependence: Secondary | ICD-10-CM | POA: Diagnosis not present

## 2019-03-20 DIAGNOSIS — R0789 Other chest pain: Secondary | ICD-10-CM | POA: Diagnosis not present

## 2019-03-20 DIAGNOSIS — R112 Nausea with vomiting, unspecified: Secondary | ICD-10-CM | POA: Insufficient documentation

## 2019-03-20 DIAGNOSIS — R109 Unspecified abdominal pain: Secondary | ICD-10-CM | POA: Diagnosis not present

## 2019-03-20 DIAGNOSIS — E039 Hypothyroidism, unspecified: Secondary | ICD-10-CM | POA: Diagnosis not present

## 2019-03-20 DIAGNOSIS — K449 Diaphragmatic hernia without obstruction or gangrene: Secondary | ICD-10-CM | POA: Diagnosis not present

## 2019-03-20 DIAGNOSIS — Z79899 Other long term (current) drug therapy: Secondary | ICD-10-CM | POA: Insufficient documentation

## 2019-03-20 DIAGNOSIS — R1013 Epigastric pain: Secondary | ICD-10-CM | POA: Insufficient documentation

## 2019-03-20 DIAGNOSIS — K219 Gastro-esophageal reflux disease without esophagitis: Secondary | ICD-10-CM | POA: Insufficient documentation

## 2019-03-20 LAB — LIPASE, BLOOD: Lipase: 27 U/L (ref 11–51)

## 2019-03-20 LAB — CBC WITH DIFFERENTIAL/PLATELET
Abs Immature Granulocytes: 0.02 10*3/uL (ref 0.00–0.07)
Basophils Absolute: 0 10*3/uL (ref 0.0–0.1)
Basophils Relative: 1 %
Eosinophils Absolute: 0.1 10*3/uL (ref 0.0–0.5)
Eosinophils Relative: 2 %
HCT: 43 % (ref 36.0–46.0)
Hemoglobin: 13.9 g/dL (ref 12.0–15.0)
Immature Granulocytes: 0 %
Lymphocytes Relative: 29 %
Lymphs Abs: 1.9 10*3/uL (ref 0.7–4.0)
MCH: 29.2 pg (ref 26.0–34.0)
MCHC: 32.3 g/dL (ref 30.0–36.0)
MCV: 90.3 fL (ref 80.0–100.0)
Monocytes Absolute: 0.6 10*3/uL (ref 0.1–1.0)
Monocytes Relative: 9 %
Neutro Abs: 3.8 10*3/uL (ref 1.7–7.7)
Neutrophils Relative %: 59 %
Platelets: 258 10*3/uL (ref 150–400)
RBC: 4.76 MIL/uL (ref 3.87–5.11)
RDW: 12.9 % (ref 11.5–15.5)
WBC: 6.4 10*3/uL (ref 4.0–10.5)
nRBC: 0 % (ref 0.0–0.2)

## 2019-03-20 LAB — COMPREHENSIVE METABOLIC PANEL
ALT: 37 U/L (ref 0–44)
AST: 51 U/L — ABNORMAL HIGH (ref 15–41)
Albumin: 3.5 g/dL (ref 3.5–5.0)
Alkaline Phosphatase: 65 U/L (ref 38–126)
Anion gap: 12 (ref 5–15)
BUN: 12 mg/dL (ref 6–20)
CO2: 20 mmol/L — ABNORMAL LOW (ref 22–32)
Calcium: 9.2 mg/dL (ref 8.9–10.3)
Chloride: 105 mmol/L (ref 98–111)
Creatinine, Ser: 0.78 mg/dL (ref 0.44–1.00)
GFR calc Af Amer: >60 mL/min (ref >60)
GFR calc non Af Amer: >60 mL/min (ref >60)
Glucose, Bld: 113 mg/dL — ABNORMAL HIGH (ref 70–99)
Potassium: 3.8 mmol/L (ref 3.5–5.1)
Sodium: 137 mmol/L (ref 135–145)
Total Bilirubin: 0.9 mg/dL (ref 0.3–1.2)
Total Protein: 6.4 g/dL — ABNORMAL LOW (ref 6.5–8.1)

## 2019-03-20 LAB — TROPONIN I
Troponin I: <0.03 ng/mL (ref <0.03)
Troponin I: <0.03 ng/mL (ref <0.03)

## 2019-03-20 MED ORDER — IOHEXOL 300 MG/ML  SOLN
100.0000 mL | Freq: Once | INTRAMUSCULAR | Status: AC | PRN
  Administered 2019-03-20: 100 mL via INTRAVENOUS

## 2019-03-20 MED ORDER — ALUM & MAG HYDROXIDE-SIMETH 200-200-20 MG/5ML PO SUSP
30.0000 mL | Freq: Once | ORAL | Status: AC
  Administered 2019-03-20: 30 mL via ORAL
  Filled 2019-03-20: qty 30

## 2019-03-20 MED ORDER — ONDANSETRON 8 MG PO TBDP: 8.0000 mg | ORAL_TABLET | Freq: Three times a day (TID) | ORAL | 0 refills | Status: DC | PRN

## 2019-03-20 MED ORDER — LIDOCAINE VISCOUS HCL 2 % MT SOLN
15.0000 mL | Freq: Once | OROMUCOSAL | Status: AC
  Administered 2019-03-20: 08:00:00 15 mL via ORAL
  Filled 2019-03-20: qty 15

## 2019-03-20 MED ORDER — FENTANYL CITRATE (PF) 100 MCG/2ML IJ SOLN
50.0000 ug | Freq: Once | INTRAMUSCULAR | Status: AC
  Administered 2019-03-20: 50 ug via INTRAVENOUS
  Filled 2019-03-20: qty 2

## 2019-03-20 MED FILL — ONDANSETRON ODT 8 MG TABLET: 8 | 3 days supply | Qty: 8 | Fill #0

## 2019-03-20 NOTE — ED Provider Notes (Signed)
Care assumed from Dr. Tamera Punt.  Please see her full H&P.  In short,  [name] is a 58 y.o. female presents for epigastric pain x 5 hours. When arriving to work this morning she had sharp epigastric pain that radiated to her back and to center of her chest. She had associated diaphoresis, nausea, 1 episode of emesis. She took zofran prior to arrival which resolved nausea. Work up started by previous provider includes CMP and CBC unremarkable. Lipase 27. First troponin is negative, second troponin pending. CT chest shows small hiatal hernia, otherwise negative. Chest xray with mild thickening of right fissure.   Waiting for second troponin to result and PO challenge.    Physical Exam  BP (!) 147/83   Pulse 79   Temp 98.1 F (36.7 C) (Oral)   Resp (!) 21   Ht 5\' 5"  (1.651 m)   Wt 81.6 kg   LMP 11/25/2005   SpO2 100%   BMI 29.95 kg/m   Physical Exam Constitutional:      Appearance: Normal appearance. She is not toxic-appearing or diaphoretic.  HENT:     Head: Normocephalic and atraumatic.     Nose: Nose normal.     Mouth/Throat:     Mouth: Mucous membranes are moist.     Pharynx: Oropharynx is clear.  Eyes:     General: No scleral icterus.       Right eye: No discharge.        Left eye: No discharge.     Conjunctiva/sclera: Conjunctivae normal.  Neck:     Musculoskeletal: Normal range of motion. No muscular tenderness.  Cardiovascular:     Rate and Rhythm: Normal rate and regular rhythm.     Pulses:          Radial pulses are 2+ on the right side and 2+ on the left side.     Heart sounds: Normal heart sounds.  Pulmonary:     Effort: Pulmonary effort is normal.     Breath sounds: Normal breath sounds.  Abdominal:     General: Bowel sounds are normal.     Palpations: Abdomen is soft.     Tenderness: There is abdominal tenderness (mild epigastric tenderness). There is no right CVA tenderness, left CVA tenderness, guarding or rebound.     Comments: No peritoneal signs.   Musculoskeletal: Normal range of motion.     Right lower leg: No edema.     Left lower leg: No edema.  Skin:    General: Skin is warm and dry.     Capillary Refill: Capillary refill takes less than 2 seconds.     Findings: No rash.  Neurological:     Mental Status: She is alert and oriented to person, place, and time.  Psychiatric:        Mood and Affect: Mood normal.        Behavior: Behavior normal.     ED Course/Procedures    Results for orders placed or performed during the hospital encounter of 03/20/19 (from the past 24 hour(s))  Comprehensive metabolic panel     Status: Abnormal   Collection Time: 03/20/19  8:02 AM  Result Value Ref Range   Sodium 137 135 - 145 mmol/L   Potassium 3.8 3.5 - 5.1 mmol/L   Chloride 105 98 - 111 mmol/L   CO2 20 (L) 22 - 32 mmol/L   Glucose, Bld 113 (H) 70 - 99 mg/dL   BUN 12 6 - 20 mg/dL  Creatinine, Ser 0.78 0.44 - 1.00 mg/dL   Calcium 9.2 8.9 - 10.3 mg/dL   Total Protein 6.4 (L) 6.5 - 8.1 g/dL   Albumin 3.5 3.5 - 5.0 g/dL   AST 51 (H) 15 - 41 U/L   ALT 37 0 - 44 U/L   Alkaline Phosphatase 65 38 - 126 U/L   Total Bilirubin 0.9 0.3 - 1.2 mg/dL   GFR calc non Af Amer >60 >60 mL/min   GFR calc Af Amer >60 >60 mL/min   Anion gap 12 5 - 15  Troponin I - Once     Status: None   Collection Time: 03/20/19  8:02 AM  Result Value Ref Range   Troponin I <0.03 <0.03 ng/mL  Lipase, blood     Status: None   Collection Time: 03/20/19  8:02 AM  Result Value Ref Range   Lipase 27 11 - 51 U/L  CBC with Differential     Status: None   Collection Time: 03/20/19  8:02 AM  Result Value Ref Range   WBC 6.4 4.0 - 10.5 K/uL   RBC 4.76 3.87 - 5.11 MIL/uL   Hemoglobin 13.9 12.0 - 15.0 g/dL   HCT 43.0 36.0 - 46.0 %   MCV 90.3 80.0 - 100.0 fL   MCH 29.2 26.0 - 34.0 pg   MCHC 32.3 30.0 - 36.0 g/dL   RDW 12.9 11.5 - 15.5 %   Platelets 258 150 - 400 K/uL   nRBC 0.0 0.0 - 0.2 %   Neutrophils Relative % 59 %   Neutro Abs 3.8 1.7 - 7.7 K/uL    Lymphocytes Relative 29 %   Lymphs Abs 1.9 0.7 - 4.0 K/uL   Monocytes Relative 9 %   Monocytes Absolute 0.6 0.1 - 1.0 K/uL   Eosinophils Relative 2 %   Eosinophils Absolute 0.1 0.0 - 0.5 K/uL   Basophils Relative 1 %   Basophils Absolute 0.0 0.0 - 0.1 K/uL   Immature Granulocytes 0 %   Abs Immature Granulocytes 0.02 0.00 - 0.07 K/uL  Troponin I - Once     Status: None   Collection Time: 03/20/19 10:58 AM  Result Value Ref Range   Troponin I <0.03 <0.03 ng/mL   CHEST - 2 VIEW COMPARISON: December 04, 2010 FINDINGS: Mild thickening of the right fissure on the frontal view could represent a tiny amount of fluid or pleural thickening. The heart, hila, mediastinum, lungs, and pleura are otherwise unremarkable. IMPRESSION: Mild thickening of the right fissure, likely either a small amount of pleural thickening or a tiny amount of fluid along the fissure. No other abnormalities. Electronically SignedBy: Dorise Bullion III M.D On: 03/20/2019 08:51   CT ABDOMEN AND PELVIS WITH CONTRAST TECHNIQUE: Multidetector CT imaging of the abdomen and pelvis was performed using the standard protocol following bolus administration of intravenous contrast. CONTRAST: 149mL OMNIPAQUE IOHEXOL 300 MG/ML SOLN COMPARISON: December 10, 2011 FINDINGS: Lower chest: There is a small hiatal hernia. Lung bases and lower chest otherwise normal. Hepatobiliary: Previous cholecystectomy. The liver is normal. Portal vein is patent. Mild extrahepatic bile duct prominence is consistent previous cholecystectomy. Pancreas: Unremarkable. No pancreatic ductal dilatation or surrounding inflammatory changes. Spleen: Normal in size without focal abnormality. Adrenals/Urinary Tract: Adrenal glands are unremarkable. Kidneys are normal, without renal calculi, focal lesion, or hydronephrosis. Bladder is unremarkable. Stomach/Bowel: Stomach is within normal limits. Appendix appears normal. No evidence of bowel wall thickening, distention, or  inflammatory changes. Vascular/Lymphatic: Mild atherosclerotic changes in the nonaneurysmal aorta. No  adenopathy. Reproductive: Status post hysterectomy. No adnexal masses. Other: No abdominal wall hernia or abnormality. No abdominopelvic Ascites. Musculoskeletal: No acute or significant osseous findings. IMPRESSION: 1. No acute abnormalities identified. 2. Small hiatal hernia. 3. Mild atherosclerotic change in the nonaneurysmal aorta. Electronically SignedBy: Dorise Bullion III M.DOn: 03/20/2019 10:52   EKG Interpretation  Date/Time:  Saturday March 20 2019 07:42:06 EDT Ventricular Rate:  74 PR Interval:    QRS Duration: 154 QT Interval:  425 QTC Calculation: 472 R Axis:   73 Text Interpretation:  Sinus rhythm Right bundle branch block Baseline wander in lead(s) V3 since last tracing no significant change Confirmed by Malvin Johns [phone]) on 03/20/2019 7:46:36 AM        MDM   58 year old female presenting with epigastric pain and associated diaphoresis, nausea and vomiting.  DDX includes pancreatitis, gastritis, less likely appendicitis, diverticulitis, SBO.  Patient's work-up is overall unremarkable including CBC, CMP, negative troponin x2.  EKG viewed by me shows sinus rhythm with a right bundle branch, this is without significant change from prior.  Patient's pain improved with GI cocktail and fentanyl.  On repeat assessment patient with mild tenderness in epigastric region, no peritoneal signs.  She denies any nausea and emesis.  Chest x-ray read by me shows mild right pleural thickening, otherwise doubt acute infectious processes.  CT abdomen shows small hiatal hernia.  As well as normal pancreas, stomach, intestines, appendix.  Discussed imaging and lab results with patient. 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  She is tolerating p.o. intake  with ginger ale and crackers.  Patient is already taking PPI as a home medication, recommend she continue taking. Patient is hemodynamically stable, in NAD, and able to ambulate in the ED. Evaluation does not show pathology that would require ongoing emergent intervention or inpatient treatment. I explained the diagnosis to the patient.  Pain has been managed and has no complaints prior to discharge. Patient is comfortable with above plan and is stable for discharge at this time. All questions were answered prior to disposition. Strict return precautions for returning to the ED were discussed. Encouraged follow up with PCP and GI.   The patient was discussed with and seen by Dr. Tamera Punt who agrees with the treatment plan.  This note was prepared with assistance of Systems analyst. Occasional wrong-word or sound-a-like substitutions may have occurred due to the inherent limitations of voice recognition software.     Cherre Robins, PA-C 03/20/19 1259    Malvin Johns, MD 03/20/19 1354

## 2019-03-20 NOTE — Discharge Instructions (Addendum)
You have been seen today for abdominal pain. Please read and follow all provided instructions. Return to the emergency room for worsening condition or new concerning symptoms.    The CT scan today showed a small hiatal hernia and was otherwise negative. Your lab work was reassuring and no significant abnormalities were seen. You had 2 cardiac enzymes drawn today that were both negative as well.  You received Maalox/mylanta today while in the emergency department. These are over the counter medications you can buy if needed. 1. Medications: Prescription sent to your pharmacy for Zofran, please take as needed and prescribed. Continue usual home medications  2. Treatment: rest, drink plenty of fluids  3. Follow Up: Please follow up with your primary doctor in 2-5 days for discussion of your diagnoses and further evaluation after today's visit; Call today to arrange your follow up.    It is also a possibility that you have an allergic reaction to any of the medicines that you have been prescribed - Everybody reacts differently to medications and while MOST people have no trouble with most medicines, you may have a reaction such as nausea, vomiting, rash, swelling, shortness of breath. If this is the case, please stop taking the medicine immediately and contact your physician.  ?

## 2019-03-20 NOTE — ED Notes (Signed)
Pt ambulatory to [facility]

## 2019-03-20 NOTE — ED Provider Notes (Signed)
[facility] EMERGENCY DEPARTMENT Provider Note   CSN: [id number] Arrival date & time: 03/20/19  4098    History   Chief Complaint No chief complaint on file.   HPI [name] is a 58 y.o. female.     Patient is a 58 year old female who presents with epigastric pain.  She states that she was coming into work this morning at about 715 had sudden onset of sharp pain in her epigastrium.  It radiates around to her back bilaterally.  She had one episode of vomiting which was nonbloody.  She has some mild radiation up into the center of her chest but no other associated chest pain.  No shortness of breath.  No fevers cough or cold symptoms.  No leg pain or swelling.  She does have a history of prior GERD and esophagitis with a prior esophageal stricture.  She took a Zofran prior to arrival and states that her nausea has resolved but she still has sharp epigastric pain.  She is status post cholecystectomy.     Past Medical History:  Diagnosis Date   Anemia 2007   due to fibroids   Duodenitis without hemorrhage    Esophageal stricture    Esophagitis    GERD (gastroesophageal reflux disease)    Hiatal hernia    Hyperlipidemia    Hypertension    Hypothyroidism    Leiomyoma of uterus, unspecified    fibroids   Pneumonia    aspiration pneumonia after balloon surgery for weight loss    Patient Active Problem List   Diagnosis Date Noted   Morbid obesity ([place]) 09/16/2012   HYPERLIPIDEMIA 09/06/2008   UTI 09/06/2008   FIBROMYALGIA 09/06/2008   HEADACHE, CHRONIC 09/06/2008   FIBROIDS, UTERUS 09/01/2008   HYPOTHYROIDISM 09/01/2008   GERD 09/01/2008    Past Surgical History:  Procedure Laterality Date   CARPAL TUNNEL RELEASE  2014   right wrist   CHOLECYSTECTOMY     HYSTEROSCOPY W/ ENDOMETRIAL ABLATION  4/05   RECTOCELE REPAIR  2007   TONSILLECTOMY     as child   TOTAL VAGINAL HYSTERECTOMY  5/07   TUBAL LIGATION  1991   BILATERAL     OB History    Gravida  3   Para  3   Term  3   Preterm  0   AB  0   Living  3     SAB  0   TAB  0   Ectopic  0   Multiple  0   Live Births  3            Home Medications    Prior to Admission medications   Medication Sig Start Date End Date Taking? Authorizing Provider  acyclovir (ZOVIRAX) 400 MG tablet Take 1 tablet (400 mg total) by mouth 2 (two) times daily. Patient taking differently: Take 400 mg by mouth every Monday, Wednesday, and Friday.  02/15/19  Yes Salvadore Dom, MD  Cholecalciferol (VITAMIN D) 2000 units tablet Take 2,000 Units by mouth 2 (two) times daily.   Yes [provider]  DULoxetine (CYMBALTA) 30 MG capsule Take 30 mg by mouth daily.   Yes [provider]  estrogens, conjugated, (PREMARIN) 0.625 MG tablet Take 1 tablet (0.625 mg total) by mouth daily. 02/15/19  Yes Salvadore Dom, MD  levothyroxine (SYNTHROID, LEVOTHROID) 75 MCG tablet Take 37.5-75 mcg by mouth See admin instructions. 75 mcg on all days except Tuesday patient takes 37.5 mcg ( 1/2  of 75 mcg tablet)   Yes [provider]  losartan (COZAAR) 100 MG tablet Take 100 mg by mouth daily.   Yes [provider]  Melatonin 5 MG CAPS Take 5 mg by mouth at bedtime.   Yes [provider]  Multiple Vitamin (MULTIVITAMIN) capsule Take 1 capsule by mouth daily.   Yes [provider]  omeprazole (PRILOSEC) 40 MG capsule Take 1 capsule (40 mg total) by mouth 2 (two) times daily. 09/14/12  Yes Irene Shipper, MD  pramipexole (MIRAPEX) 0.125 MG tablet Take 1 tablet by mouth at bedtime. 06/16/18  Yes [provider]  rosuvastatin (CRESTOR) 10 MG tablet Take 10 mg by mouth daily.   Yes [provider]  vitamin B-12 (CYANOCOBALAMIN) 100 MCG tablet Take 100 mcg by mouth daily.   Yes [provider]  ondansetron (ZOFRAN ODT) 8 MG disintegrating tablet Take 1 tablet (8 mg total) by mouth every 8 (eight)  hours as needed for nausea or vomiting. 03/20/19   Albrizze, Harley Hallmark, PA-C    Family History Family History  Problem Relation Age of Onset   Celiac disease Mother    Osteoporosis Mother    Heart disease Father    Stroke Father    Irritable bowel syndrome Sister    Lung cancer Maternal Grandmother    Breast cancer Paternal Grandmother    Heart disease Sister    Melanoma Sister    Colon cancer Neg Hx    Esophageal cancer Neg Hx    Stomach cancer Neg Hx    Rectal cancer Neg Hx     Social History Social History   Tobacco Use   Smoking status: Former Smoker    Last attempt to quit: 12/23/2002    Years since quitting: 16.2   Smokeless tobacco: Never Used  Substance Use Topics   Alcohol use: Yes    Comment: rare   Drug use: No     Allergies   Patient has no known allergies.   Review of Systems Review of Systems  Constitutional: Negative for chills, diaphoresis, fatigue and fever.  HENT: Negative for congestion, rhinorrhea and sneezing.   Eyes: Negative.   Respiratory: Negative for cough, chest tightness and shortness of breath.   Cardiovascular: Positive for chest pain. Negative for leg swelling.  Gastrointestinal: Positive for abdominal pain, nausea and vomiting. Negative for blood in stool and diarrhea.  Genitourinary: Negative for difficulty urinating, flank pain, frequency and hematuria.  Musculoskeletal: Negative for arthralgias and back pain.  Skin: Negative for rash.  Neurological: Negative for dizziness, speech difficulty, weakness, numbness and headaches.     Physical Exam Updated Vital Signs BP (!) 142/95    Pulse 79    Temp 98.1 F (36.7 C) (Oral)    Resp 16    Ht 5\' 5"  (1.651 m)    Wt 81.6 kg    LMP 11/25/2005    SpO2 100%    BMI 29.95 kg/m   Physical Exam Constitutional:      Appearance: She is well-developed.  HENT:     Head: Normocephalic and atraumatic.  Eyes:     Pupils: Pupils are equal, round, and reactive to light.    Neck:     Musculoskeletal: Normal range of motion and neck supple.  Cardiovascular:     Rate and Rhythm: Normal rate and regular rhythm.     Heart sounds: Normal heart sounds.  Pulmonary:     Effort: Pulmonary effort is normal. No respiratory distress.  Breath sounds: Normal breath sounds. No wheezing or rales.  Chest:     Chest wall: No tenderness.  Abdominal:     General: Bowel sounds are normal.     Palpations: Abdomen is soft.     Tenderness: There is abdominal tenderness (epigastric tenderness). There is no guarding or rebound.  Musculoskeletal: Normal range of motion.  Lymphadenopathy:     Cervical: No cervical adenopathy.  Skin:    General: Skin is warm and dry.     Findings: No rash.  Neurological:     Mental Status: She is alert and oriented to person, place, and time.      ED Treatments / Results  Labs (all labs ordered are listed, but only abnormal results are displayed) Labs Reviewed  COMPREHENSIVE METABOLIC PANEL - Abnormal; Notable for the following components:      Result Value   CO2 20 (*)    Glucose, Bld 113 (*)    Total Protein 6.4 (*)    AST 51 (*)    All other components within normal limits  TROPONIN I  LIPASE, BLOOD  CBC WITH DIFFERENTIAL/PLATELET  TROPONIN I    EKG EKG Interpretation  Date/Time:  Saturday March 20 2019 07:42:06 EDT Ventricular Rate:  74 PR Interval:    QRS Duration: 154 QT Interval:  425 QTC Calculation: 472 R Axis:   73 Text Interpretation:  Sinus rhythm Right bundle branch block Baseline wander in lead(s) V3 since last tracing no significant change Confirmed by Malvin Johns [phone]) on 03/20/2019 7:46:36 AM   Radiology Dg Chest 2 View  Result Date: 03/20/2019 CLINICAL DATA:  Epigastric pain. EXAM: CHEST - 2 VIEW COMPARISON:  December 04, 2010 FINDINGS: Mild thickening of the right fissure on the frontal view could represent a tiny amount of fluid or pleural thickening. The heart, hila, mediastinum, lungs, and  pleura are otherwise unremarkable. IMPRESSION: Mild thickening of the right fissure, likely either a small amount of pleural thickening or a tiny amount of fluid along the fissure. No other abnormalities. Electronically Signed   By: Dorise Bullion III M.D   On: 03/20/2019 08:51   Ct Abdomen Pelvis W Contrast  Result Date: 03/20/2019 CLINICAL DATA:  Centralized chest pain radiating to back. One episode of emesis. Shortness of breath. EXAM: CT ABDOMEN AND PELVIS WITH CONTRAST TECHNIQUE: Multidetector CT imaging of the abdomen and pelvis was performed using the standard protocol following bolus administration of intravenous contrast. CONTRAST:  159mL OMNIPAQUE IOHEXOL 300 MG/ML  SOLN COMPARISON:  December 10, 2011 FINDINGS: Lower chest: There is a small hiatal hernia. Lung bases and lower chest otherwise normal. Hepatobiliary: Previous cholecystectomy. The liver is normal. Portal vein is patent. Mild extrahepatic bile duct prominence is consistent previous cholecystectomy. Pancreas: Unremarkable. No pancreatic ductal dilatation or surrounding inflammatory changes. Spleen: Normal in size without focal abnormality. Adrenals/Urinary Tract: Adrenal glands are unremarkable. Kidneys are normal, without renal calculi, focal lesion, or hydronephrosis. Bladder is unremarkable. Stomach/Bowel: Stomach is within normal limits. Appendix appears normal. No evidence of bowel wall thickening, distention, or inflammatory changes. Vascular/Lymphatic: Mild atherosclerotic changes in the nonaneurysmal aorta. No adenopathy. Reproductive: Status post hysterectomy. No adnexal masses. Other: No abdominal wall hernia or abnormality. No abdominopelvic ascites. Musculoskeletal: No acute or significant osseous findings. IMPRESSION: 1. No acute abnormalities identified. 2. Small hiatal hernia. 3. Mild atherosclerotic change in the nonaneurysmal aorta. Electronically Signed   By: Dorise Bullion III M.D   On: 03/20/2019 10:52     Procedures Procedures (including critical care  time)  Medications Ordered in ED Medications  alum & mag hydroxide-simeth (MAALOX/MYLANTA) 200-200-20 MG/5ML suspension 30 mL (30 mLs Oral Given 03/20/19 0817)    And  lidocaine (XYLOCAINE) 2 % viscous mouth solution 15 mL (15 mLs Oral Given 03/20/19 0817)  fentaNYL (SUBLIMAZE) injection 50 mcg (50 mcg Intravenous Given 03/20/19 0929)  iohexol (OMNIPAQUE) 300 MG/ML solution 100 mL (100 mLs Intravenous Contrast Given 03/20/19 1009)     Initial Impression / Assessment and Plan / ED Course  I have reviewed the triage vital signs and the nursing notes.  Pertinent labs & imaging results that were available during my care of the patient were reviewed by me and considered in my medical decision making (see chart for details).        9:25 pt feels a little better after GI cocktail, but now pain is radiating more to back.  Still tender across upper abd.  Will check CT.  Given fentanyl.  CT scan shows no acute abnormality.  Patient still has some ongoing epigastric pain although better after GI cocktail.  I feel like her symptoms are more consistent with GI etiology versus acute coronary syndrome.  She has no ischemic changes on EKG.  Her troponin is negative.  She is awaiting a second troponin and p.o. trial.  Care was taken over by Emeterio Reeve, PA-C.  Final Clinical Impressions(s) / ED Diagnoses   Final diagnoses:  Abdominal pain, unspecified abdominal location    ED Discharge Orders         Ordered    ondansetron (ZOFRAN ODT) 8 MG disintegrating tablet  Every 8 hours PRN     03/20/19 1249           Malvin Johns, MD 03/20/19 1535

## 2019-03-20 NOTE — ED Notes (Signed)
Patient verbalizes understanding of discharge instructions. Opportunity for questioning and answers were provided. Armband removed by staff, pt discharged from ED.  

## 2019-03-20 NOTE — ED Notes (Signed)
Report received from [place], [place].

## 2019-03-20 NOTE — ED Triage Notes (Signed)
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

## 2019-03-20 NOTE — ED Notes (Signed)
ED Provider at bedside. 

## 2019-03-20 NOTE — ED Notes (Signed)
Pt given ginger ale and graham crackers for PO challenge; tolerated well.

## 2019-03-22 DIAGNOSIS — K219 Gastro-esophageal reflux disease without esophagitis: Secondary | ICD-10-CM | POA: Diagnosis not present

## 2019-03-22 DIAGNOSIS — A084 Viral intestinal infection, unspecified: Secondary | ICD-10-CM | POA: Diagnosis not present

## 2019-03-22 DIAGNOSIS — R945 Abnormal results of liver function studies: Secondary | ICD-10-CM | POA: Diagnosis not present

## 2019-03-22 DIAGNOSIS — I10 Essential (primary) hypertension: Secondary | ICD-10-CM | POA: Diagnosis not present

## 2019-03-22 DIAGNOSIS — R1013 Epigastric pain: Secondary | ICD-10-CM | POA: Diagnosis not present

## 2019-04-09 MED FILL — LEVOTHYROXINE 75 MCG TABLET: 75 | 90 days supply | Qty: 90 | Fill #0

## 2019-04-20 MED FILL — DULoxetine HCL 30 MG CPEP: 30 | 90 days supply | Qty: 90 | Fill #1

## 2019-04-20 MED FILL — LOSARTAN POTASSIUM 100 MG T: 100 | 30 days supply | Qty: 30 | Fill #1

## 2019-04-21 DIAGNOSIS — Z0000 Encounter for general adult medical examination without abnormal findings: Secondary | ICD-10-CM | POA: Diagnosis not present

## 2019-04-23 DIAGNOSIS — I10 Essential (primary) hypertension: Secondary | ICD-10-CM | POA: Diagnosis not present

## 2019-04-23 DIAGNOSIS — R82998 Other abnormal findings in urine: Secondary | ICD-10-CM | POA: Diagnosis not present

## 2019-04-28 DIAGNOSIS — R945 Abnormal results of liver function studies: Secondary | ICD-10-CM | POA: Diagnosis not present

## 2019-04-28 DIAGNOSIS — E785 Hyperlipidemia, unspecified: Secondary | ICD-10-CM | POA: Diagnosis not present

## 2019-04-28 DIAGNOSIS — I10 Essential (primary) hypertension: Secondary | ICD-10-CM | POA: Diagnosis not present

## 2019-04-28 DIAGNOSIS — Z0000 Encounter for general adult medical examination without abnormal findings: Secondary | ICD-10-CM | POA: Diagnosis not present

## 2019-04-28 DIAGNOSIS — K219 Gastro-esophageal reflux disease without esophagitis: Secondary | ICD-10-CM | POA: Diagnosis not present

## 2019-04-28 DIAGNOSIS — Z1331 Encounter for screening for depression: Secondary | ICD-10-CM | POA: Diagnosis not present

## 2019-04-28 DIAGNOSIS — N951 Menopausal and female climacteric states: Secondary | ICD-10-CM | POA: Diagnosis not present

## 2019-04-28 DIAGNOSIS — E669 Obesity, unspecified: Secondary | ICD-10-CM | POA: Diagnosis not present

## 2019-04-28 DIAGNOSIS — M5380 Other specified dorsopathies, site unspecified: Secondary | ICD-10-CM | POA: Diagnosis not present

## 2019-04-28 DIAGNOSIS — G2581 Restless legs syndrome: Secondary | ICD-10-CM | POA: Diagnosis not present

## 2019-04-28 MED FILL — CHLORTHALIDONE 25 MG TABS: 25 | 90 days supply | Qty: 90 | Fill #0

## 2019-04-29 MED FILL — PREMARIN 0.625 MG TABLET: 0.625 | 90 days supply | Qty: 90 | Fill #0

## 2019-04-29 MED FILL — ACYCLOVIR 400 MG TABLET: 400 | 90 days supply | Qty: 180 | Fill #0

## 2019-05-14 MED FILL — OMEPRAZOLE DR 40 MG CAPSULE: 40 | 90 days supply | Qty: 180 | Fill #0

## 2019-05-14 MED FILL — LOSARTAN POTASSIUM 100 MG T: 100 | 60 days supply | Qty: 60 | Fill #2

## 2019-05-21 MED FILL — PRAMIPEXOLE 0.125 MG TABLET: 0.125 | 90 days supply | Qty: 90 | Fill #0

## 2019-06-02 ENCOUNTER — Ambulatory Visit (INDEPENDENT_AMBULATORY_CARE_PROVIDER_SITE_OTHER): Payer: 59

## 2019-06-02 ENCOUNTER — Encounter: Payer: Self-pay | Admitting: Orthopedic Surgery

## 2019-06-02 ENCOUNTER — Other Ambulatory Visit: Payer: Self-pay

## 2019-06-02 ENCOUNTER — Ambulatory Visit (INDEPENDENT_AMBULATORY_CARE_PROVIDER_SITE_OTHER): Payer: 59 | Admitting: Orthopedic Surgery

## 2019-06-02 DIAGNOSIS — M25511 Pain in right shoulder: Secondary | ICD-10-CM

## 2019-06-02 DIAGNOSIS — M7541 Impingement syndrome of right shoulder: Secondary | ICD-10-CM | POA: Diagnosis not present

## 2019-06-03 DIAGNOSIS — M7541 Impingement syndrome of right shoulder: Secondary | ICD-10-CM

## 2019-06-03 DIAGNOSIS — M25511 Pain in right shoulder: Secondary | ICD-10-CM | POA: Diagnosis not present

## 2019-06-03 MED ORDER — METHYLPREDNISOLONE ACETATE 40 MG/ML IJ SUSP
40.0000 mg | INTRAMUSCULAR | Status: AC | PRN
  Administered 2019-06-03: 07:00:00 40 mg via INTRA_ARTICULAR

## 2019-06-03 MED ORDER — LIDOCAINE HCL 1 % IJ SOLN
5.0000 mL | INTRAMUSCULAR | Status: AC | PRN
  Administered 2019-06-03: 07:00:00 5 mL

## 2019-06-03 MED ORDER — BUPIVACAINE HCL 0.5 % IJ SOLN
9.0000 mL | INTRAMUSCULAR | Status: AC | PRN
  Administered 2019-06-03: 07:00:00 9 mL via INTRA_ARTICULAR

## 2019-06-03 NOTE — Progress Notes (Signed)
Office Visit Note   Patient: [name]           Date of Birth: [birth year]           MRN: [id number] Visit Date: 06/02/2019 Requested by: Crist Infante, MD [address] [place],  [place] [address] PCP: Crist Infante, MD  Subjective: Chief Complaint  Patient presents with  . Right Shoulder - Pain    HPI: Avanell is a patient with right shoulder pain of several months.  She is right-hand dominant.  Denies any history of specific injury.  She does state that she fell on her shoulder about a year ago and that healed without any major events.  Had another fall about 6 months later and then 3 months later but without significant injuries including swelling or weakness.  She reports generally insidious onset of pain as well as catching when she moves her arm in full abduction to neutral position.  She denies much in the way of radiating neck pain except for some occasional strap muscle tenderness.  Denies numbness and tingling in the hands.  She has tried topical Voltaren as well as ibuprofen.              ROS: All systems reviewed are negative as they relate to the chief complaint within the history of present illness.  Patient denies  fevers or chills.   Assessment & Plan: Visit Diagnoses:  1. Right shoulder pain, unspecified chronicity   2. Impingement syndrome of right shoulder     Plan: Impression is right shoulder pain with some pain that is waking her from sleep at night.  Has been going on like that for about 3 or 4 weeks.  Her examination and radiographs are not too revealing but her pain pattern in the deltoid region suggests impingement bursitis.  Plan is subacromial injection today with some below shoulder level strengthening exercises.  Come back in 3 weeks to decide for or against further imaging.  The real crux of this particular presentation is her night pain which is fairly frequent over the past 4 weeks.  I will see her back after that 3 weeks time.  Follow-Up Instructions:  Return in about 3 weeks (around 06/23/2019).   Orders:  Orders Placed This Encounter  Procedures  . XR Shoulder Right   No orders of the defined types were placed in this encounter.     Procedures: Large Joint Inj: R subacromial bursa on 06/03/2019 7:00 AM Indications: diagnostic evaluation and pain Details: 18 G 1.5 in needle, posterior approach  Arthrogram: No  Medications: 9 mL bupivacaine 0.5 %; 40 mg methylPREDNISolone acetate 40 MG/ML; 5 mL lidocaine 1 % Outcome: tolerated well, no immediate complications Procedure, treatment alternatives, risks and benefits explained, specific risks discussed. Consent was given by the patient. Immediately prior to procedure a time out was called to verify the correct patient, procedure, equipment, support staff and site/side marked as required. Patient was prepped and draped in the usual sterile fashion.       Clinical Data: No additional findings.  Objective: Vital Signs: LMP 11/25/2005   Physical Exam:   Constitutional: Patient appears well-developed HEENT:  Head: Normocephalic Eyes:EOM are normal Neck: Normal range of motion Cardiovascular: Normal rate Pulmonary/chest: Effort normal Neurologic: Patient is alert Skin: Skin is warm Psychiatric: Patient has normal mood and affect    Ortho Exam: Ortho exam demonstrates full active and passive range of motion of the cervical spine.  Patient has 5 out of 5 grip  EPL FPL interosseous wrist flexion extension bicep triceps and deltoid strength.  No restriction of passive external rotation of 15 degrees of abduction.  No discrete AC joint tenderness to direct palpation.  Rotator cuff strength is excellent infraspinatus supraspinatus and subscap muscle testing.  No coarse grinding or crepitus noted with passive range of motion of the right shoulder  Specialty Comments:  No specialty comments available.  Imaging: Xr Shoulder Right  Result Date: 06/03/2019 AP axillary outlet right  shoulder reviewed.  Visualized lung fields clear.  Mild AC joint arthritis is present.  No glenohumeral joint arthritis is present.  No acute fracture or dislocation.  Acromiohumeral distance appears to be maintained.  Normal right shoulder    PMFS History: Patient Active Problem List   Diagnosis Date Noted  . Morbid obesity ([place]) 09/16/2012  . HYPERLIPIDEMIA 09/06/2008  . UTI 09/06/2008  . FIBROMYALGIA 09/06/2008  . HEADACHE, CHRONIC 09/06/2008  . FIBROIDS, UTERUS 09/01/2008  . HYPOTHYROIDISM 09/01/2008  . GERD 09/01/2008   Past Medical History:  Diagnosis Date  . Anemia 2007   due to fibroids  . Duodenitis without hemorrhage   . Esophageal stricture   . Esophagitis   . GERD (gastroesophageal reflux disease)   . Hiatal hernia   . Hyperlipidemia   . Hypertension   . Hypothyroidism   . Leiomyoma of uterus, unspecified    fibroids  . Pneumonia    aspiration pneumonia after balloon surgery for weight loss    Family History  Problem Relation Age of Onset  . Celiac disease Mother   . Osteoporosis Mother   . Heart disease Father   . Stroke Father   . Irritable bowel syndrome Sister   . Lung cancer Maternal Grandmother   . Breast cancer Paternal Grandmother   . Heart disease Sister   . Melanoma Sister   . Colon cancer Neg Hx   . Esophageal cancer Neg Hx   . Stomach cancer Neg Hx   . Rectal cancer Neg Hx     Past Surgical History:  Procedure Laterality Date  . CARPAL TUNNEL RELEASE  2014   right wrist  . CHOLECYSTECTOMY    . HYSTEROSCOPY W/ ENDOMETRIAL ABLATION  4/05  . RECTOCELE REPAIR  2007  . TONSILLECTOMY     as child  . TOTAL VAGINAL HYSTERECTOMY  5/07  . TUBAL LIGATION  1991   BILATERAL   Social History   Occupational History  . Occupation: Nurse  Tobacco Use  . Smoking status: Former Smoker    Quit date: 12/23/2002    Years since quitting: 16.4  . Smokeless tobacco: Never Used  Substance and Sexual Activity  . Alcohol use: Yes    Comment: rare   . Drug use: No  . Sexual activity: Yes    Partners: Male    Birth control/protection: Surgical    Comment: TVH

## 2019-06-04 DIAGNOSIS — I10 Essential (primary) hypertension: Secondary | ICD-10-CM | POA: Diagnosis not present

## 2019-06-23 ENCOUNTER — Ambulatory Visit: Payer: 59 | Admitting: Orthopedic Surgery

## 2019-07-09 MED FILL — LOSARTAN POTASSIUM 100 MG T: 100 | 90 days supply | Qty: 90 | Fill #0

## 2019-07-09 MED FILL — DULoxetine HCL 30 MG CPEP: 30 | 90 days supply | Qty: 90 | Fill #0

## 2019-07-09 MED FILL — LEVOTHYROXINE 75 MCG TABLET: 75 | 90 days supply | Qty: 90 | Fill #0

## 2019-07-20 MED FILL — CHLORTHALIDONE 25 MG TABS: 25 | 90 days supply | Qty: 90 | Fill #1

## 2019-08-17 MED FILL — PREMARIN 0.625 MG TABLET: 0.625 | 90 days supply | Qty: 90 | Fill #1

## 2019-08-17 MED FILL — PRAMIPEXOLE 0.125 MG TABLET: 0.125 | 90 days supply | Qty: 90 | Fill #1

## 2019-08-17 MED FILL — OMEPRAZOLE DR 40 MG CAPSULE: 40 | 90 days supply | Qty: 180 | Fill #0

## 2019-09-10 ENCOUNTER — Encounter: Payer: Self-pay | Admitting: Obstetrics & Gynecology

## 2019-09-10 DIAGNOSIS — Z1231 Encounter for screening mammogram for malignant neoplasm of breast: Secondary | ICD-10-CM | POA: Diagnosis not present

## 2019-09-10 DIAGNOSIS — Z803 Family history of malignant neoplasm of breast: Secondary | ICD-10-CM | POA: Diagnosis not present

## 2019-09-29 DIAGNOSIS — H5213 Myopia, bilateral: Secondary | ICD-10-CM | POA: Diagnosis not present

## 2019-09-29 DIAGNOSIS — H524 Presbyopia: Secondary | ICD-10-CM | POA: Diagnosis not present

## 2019-10-11 MED FILL — LOSARTAN POTASSIUM 100 MG T: 100 | 90 days supply | Qty: 90 | Fill #1

## 2019-10-11 MED FILL — DULoxetine HCL 30 MG CPEP: 30 | 90 days supply | Qty: 90 | Fill #1

## 2019-10-13 ENCOUNTER — Encounter: Payer: Self-pay | Admitting: Orthopedic Surgery

## 2019-10-13 ENCOUNTER — Other Ambulatory Visit: Payer: Self-pay

## 2019-10-13 ENCOUNTER — Ambulatory Visit (INDEPENDENT_AMBULATORY_CARE_PROVIDER_SITE_OTHER): Payer: 59 | Admitting: Orthopedic Surgery

## 2019-10-13 DIAGNOSIS — M25511 Pain in right shoulder: Secondary | ICD-10-CM

## 2019-10-13 NOTE — Progress Notes (Signed)
Office Visit Note   Patient: [name]           Date of Birth: 30-Jul-1961           MRN: [id number] Visit Date: 10/13/2019 Requested by: Crist Infante, MD [address] [place],  [place] [address] PCP: Crist Infante, MD  Subjective: Chief Complaint  Patient presents with  . 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  She had previous subacromial injection 06/02/2019 which helped her for about 6 weeks.  Did get good relief during that time but then started having recurrent pain.  She noticed a pop 2 days ago in that right shoulder which was pretty severely painful.  It has gradually improved since then.  Abduction does hurt her shoulder.  She reports pain more than weakness as a problem.  The pain is not constant but it is worse in the morning.  Hard for her to reach up and hard for her to lay on the right shoulder at night.  She is managing the pain with topical anti-inflammatory.              ROS: All systems reviewed are negative as they relate to the chief complaint within the history of present illness.  Patient denies  fevers or chills.   Assessment & Plan: Visit Diagnoses:  1. Right shoulder pain, unspecified chronicity     Plan: Impression is right shoulder pain with pop earlier this week.  No Popeye deformity is present but she does have more coarse grinding and crepitus in the right shoulder this exam than she had previously.  Based on her history and continued symptoms and failure of conservative management MRI arthrogram of the shoulder is indicated at this time to look for ruptured but not yet retracted biceps tendon tear versus labral tear versus small rotator cuff tear.  Follow-up after that study.  Follow-Up Instructions: Return for after MRI.   Orders:  Orders Placed This Encounter  Procedures  . MR SHOULDER RIGHT W CONTRAST  . Arthrogram   No orders of the defined types were placed in this encounter.     Procedures: No  procedures performed   Clinical Data: No additional findings.  Objective: Vital Signs: LMP 11/25/2005   Physical Exam:   Constitutional: Patient appears well-developed HEENT:  Head: Normocephalic Eyes:EOM are normal Neck: Normal range of motion Cardiovascular: Normal rate Pulmonary/chest: Effort normal Neurologic: Patient is alert Skin: Skin is warm Psychiatric: Patient has normal mood and affect    Ortho Exam: Ortho exam demonstrates full active and passive range of motion of the neck.  The right shoulder has more coarse grinding and crepitus with passive range of motion particularly at 90 degrees of abduction.  Motor sensory function to the hand is intact.  No other masses lymphadenopathy or skin changes noted in that shoulder girdle region.  No Popeye deformity is present.  She does have a little bit of pain with resisted supination on the right compared to the left.  No discrete AC joint tenderness is present.  Specialty Comments:  No specialty comments available.  Imaging: No results found.   PMFS History: Patient Active Problem List   Diagnosis Date Noted  . Morbid obesity ([place]) 09/16/2012  . HYPERLIPIDEMIA 09/06/2008  . UTI 09/06/2008  . FIBROMYALGIA 09/06/2008  . HEADACHE, CHRONIC 09/06/2008  . FIBROIDS, UTERUS 09/01/2008  . HYPOTHYROIDISM 09/01/2008  . GERD 09/01/2008   Past Medical History:  Diagnosis Date  .  Anemia 2007   due to fibroids  . Duodenitis without hemorrhage   . Esophageal stricture   . Esophagitis   . GERD (gastroesophageal reflux disease)   . Hiatal hernia   . Hyperlipidemia   . Hypertension   . Hypothyroidism   . Leiomyoma of uterus, unspecified    fibroids  . Pneumonia    aspiration pneumonia after balloon surgery for weight loss    Family History  Problem Relation Age of Onset  . Celiac disease Mother   . Osteoporosis Mother   . Heart disease Father   . Stroke Father   . Irritable bowel syndrome Sister   . Lung cancer  Maternal Grandmother   . Breast cancer Paternal Grandmother   . Heart disease Sister   . Melanoma Sister   . Colon cancer Neg Hx   . Esophageal cancer Neg Hx   . Stomach cancer Neg Hx   . Rectal cancer Neg Hx     Past Surgical History:  Procedure Laterality Date  . CARPAL TUNNEL RELEASE  2014   right wrist  . CHOLECYSTECTOMY    . HYSTEROSCOPY W/ ENDOMETRIAL ABLATION  4/05  . RECTOCELE REPAIR  2007  . TONSILLECTOMY     as child  . TOTAL VAGINAL HYSTERECTOMY  5/07  . TUBAL LIGATION  1991   BILATERAL   Social History   Occupational History  . Occupation: Nurse  Tobacco Use  . Smoking status: Former Smoker    Quit date: 12/23/2002    Years since quitting: 16.8  . Smokeless tobacco: Never Used  Substance and Sexual Activity  . Alcohol use: Yes    Comment: rare  . Drug use: No  . Sexual activity: Yes    Partners: Male    Birth control/protection: Surgical    Comment: TVH

## 2019-10-20 MED FILL — CHLORTHALIDONE 25 MG TABS: 25 | 90 days supply | Qty: 90 | Fill #2

## 2019-10-20 MED FILL — LEVOTHYROXINE 75 MCG TABLET: 75 | 90 days supply | Qty: 90 | Fill #1

## 2019-10-26 ENCOUNTER — Other Ambulatory Visit: Payer: Self-pay

## 2019-10-28 ENCOUNTER — Ambulatory Visit: Payer: 59 | Admitting: Obstetrics & Gynecology

## 2019-10-28 ENCOUNTER — Encounter

## 2019-11-09 MED FILL — PRAMIPEXOLE 0.125 MG TABLET: 0.125 | 90 days supply | Qty: 90 | Fill #2

## 2019-11-11 ENCOUNTER — Ambulatory Visit
Admission: RE | Admit: 2019-11-11 | Discharge: 2019-11-11 | Disposition: A | Discharge status: Home / Self Care | Payer: 59 | Source: Ambulatory Visit | Attending: Orthopedic Surgery | Admitting: Orthopedic Surgery

## 2019-11-11 ENCOUNTER — Other Ambulatory Visit: Payer: Self-pay

## 2019-11-11 DIAGNOSIS — S46011A Strain of muscle(s) and tendon(s) of the rotator cuff of right shoulder, initial encounter: Secondary | ICD-10-CM | POA: Diagnosis not present

## 2019-11-11 DIAGNOSIS — M25511 Pain in right shoulder: Secondary | ICD-10-CM

## 2019-11-11 MED ORDER — IOPAMIDOL (ISOVUE-M 200) INJECTION 41%
15.0000 mL | Freq: Once | INTRAMUSCULAR | Status: AC
  Administered 2019-11-11: 10 mL via INTRA_ARTICULAR

## 2019-11-15 ENCOUNTER — Other Ambulatory Visit: Payer: Self-pay

## 2019-11-15 ENCOUNTER — Other Ambulatory Visit: Payer: Self-pay | Admitting: Obstetrics and Gynecology

## 2019-11-15 ENCOUNTER — Ambulatory Visit (INDEPENDENT_AMBULATORY_CARE_PROVIDER_SITE_OTHER): Payer: 59 | Admitting: Orthopedic Surgery

## 2019-11-15 DIAGNOSIS — S46011D Strain of muscle(s) and tendon(s) of the rotator cuff of right shoulder, subsequent encounter: Secondary | ICD-10-CM | POA: Diagnosis not present

## 2019-11-15 MED FILL — PREMARIN 0.625 MG TABLET: 0.625 | 90 days supply | Qty: 90 | Fill #0

## 2019-11-15 MED FILL — OMEPRAZOLE 40 MG CPDR: 40 | 90 days supply | Qty: 180 | Fill #1

## 2019-11-15 NOTE — Telephone Encounter (Signed)
Medication refill request: Premarin  Last AEX:  06-26-18 SM  Next AEX: 03-09-20 Last MMG (if hormonal medication request): 09-10-2019 density A/BIRADS 1 negative  Refill authorized: Today, please advise.   Medication pended for #90,1RF. Please refill if appropriate.

## 2019-11-16 ENCOUNTER — Encounter: Payer: Self-pay | Admitting: Orthopedic Surgery

## 2019-11-16 ENCOUNTER — Telehealth: Payer: Self-pay | Admitting: Orthopedic Surgery

## 2019-11-16 NOTE — Progress Notes (Signed)
Office Visit Note   Patient: [name]           Date of Birth: 07-22-1961           MRN: [id number] Visit Date: 11/15/2019 Requested by: Crist Infante, MD [address] [place],  [place] [address] PCP: Crist Infante, MD  Subjective: Chief Complaint  Patient presents with  . Right Shoulder - Follow-up    HPI: Myliyah is a patient with right shoulder pain.  She has had 3 falls.  First fall was 1 year ago at work.  Second fall occurred at home and she has had a third fall as well.  She since have seen her she is had an MRI scan.  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  MRI scan demonstrates subscapularis tear without retraction with medial dislocation of the biceps as well as supraspinatus tear of about 1/2 cm.              ROS: All systems reviewed are negative as they relate to the chief complaint within the history of present illness.  Patient denies  fevers or chills.   Assessment & Plan: Visit Diagnoses:  1. Traumatic complete tear of right rotator cuff, subsequent encounter     Plan: Impression is traumatic rotator cuff tear with anterior superior type tear pattern currently.  There is medial dislocation of the biceps tendon.  I think this is been progressing since her earliest fall.  Currently the tears are repairable but I would not want a wait too long especially for that subscapularis tear which if it becomes complete can retract easily in unretrievable he fairly quickly.  She is going to consider options.  I would recommend arthroscopy with biceps tenodesis and mini open rotator cuff repair of the subscap and supraspinatus.  Described for her what that rehabilitative process would be like.  Also discussed the risk benefits including but limited to infection nerve vessel damage shoulder stiffness as well as potential for retear.  I would like her to use a CPM machine for the first 2 weeks after surgery.  Patient understands and she will call  if she wants to schedule.  Follow-Up Instructions: Return if symptoms worsen or fail to improve.   Orders:  No orders of the defined types were placed in this encounter.  No orders of the defined types were placed in this encounter.     Procedures: No procedures performed   Clinical Data: No additional findings.  Objective: Vital Signs: LMP 11/25/2005   Physical Exam:   Constitutional: Patient appears well-developed HEENT:  Head: Normocephalic Eyes:EOM are normal Neck: Normal range of motion Cardiovascular: Normal rate Pulmonary/chest: Effort normal Neurologic: Patient is alert Skin: Skin is warm Psychiatric: Patient has normal mood and affect    Ortho Exam: Ortho exam demonstrates full active and passive range of motion of the left shoulder.  On the right-hand side she does have about 5- out of 5 subscap weakness compared to the left-hand side.  Passive range of motion is maintained.  Infraspinatus strength is good symmetrically.  Supraspinatus strength slightly weak on the right compared to the left.  Specialty Comments:  No specialty comments available.  Imaging: No results found.   PMFS History: Patient Active Problem List   Diagnosis Date Noted  . Morbid obesity ([place]) 09/16/2012  . HYPERLIPIDEMIA 09/06/2008  . UTI 09/06/2008  . FIBROMYALGIA 09/06/2008  . HEADACHE, CHRONIC 09/06/2008  . FIBROIDS, UTERUS 09/01/2008  .  HYPOTHYROIDISM 09/01/2008  . GERD 09/01/2008   Past Medical History:  Diagnosis Date  . Anemia 2007   due to fibroids  . Duodenitis without hemorrhage   . Esophageal stricture   . Esophagitis   . GERD (gastroesophageal reflux disease)   . Hiatal hernia   . Hyperlipidemia   . Hypertension   . Hypothyroidism   . Leiomyoma of uterus, unspecified    fibroids  . Pneumonia    aspiration pneumonia after balloon surgery for weight loss    Family History  Problem Relation Age of Onset  . Celiac disease Mother   . Osteoporosis Mother    . Heart disease Father   . Stroke Father   . Irritable bowel syndrome Sister   . Lung cancer Maternal Grandmother   . Breast cancer Paternal Grandmother   . Heart disease Sister   . Melanoma Sister   . Colon cancer Neg Hx   . Esophageal cancer Neg Hx   . Stomach cancer Neg Hx   . Rectal cancer Neg Hx     Past Surgical History:  Procedure Laterality Date  . CARPAL TUNNEL RELEASE  2014   right wrist  . CHOLECYSTECTOMY    . HYSTEROSCOPY W/ ENDOMETRIAL ABLATION  4/05  . RECTOCELE REPAIR  2007  . TONSILLECTOMY     as child  . TOTAL VAGINAL HYSTERECTOMY  5/07  . TUBAL LIGATION  1991   BILATERAL   Social History   Occupational History  . Occupation: Nurse  Tobacco Use  . Smoking status: Former Smoker    Quit date: 12/23/2002    Years since quitting: 16.9  . Smokeless tobacco: Never Used  Substance and Sexual Activity  . Alcohol use: Yes    Comment: rare  . Drug use: No  . Sexual activity: Yes    Partners: Male    Birth control/protection: Surgical    Comment: TVH

## 2019-11-16 NOTE — Telephone Encounter (Signed)
Patient calling to say she would like to move forward with surgery on her right shoulder.  Patient is employed by [employer] with [employer]. In order to maximize her benefits, surgery may need to be scheduled with a [facility] facility.  Please provide surgery sheet.

## 2019-11-16 NOTE — Telephone Encounter (Signed)
Please complete surgery sheet. Thanks.

## 2019-11-17 NOTE — Telephone Encounter (Signed)
Done pls calal thx

## 2019-11-22 ENCOUNTER — Encounter: Payer: Self-pay | Admitting: Orthopedic Surgery

## 2019-11-24 ENCOUNTER — Telehealth: Payer: Self-pay | Admitting: Orthopedic Surgery

## 2019-11-24 NOTE — Telephone Encounter (Signed)
Patient scheduled for right shoulder arthroscopy, debridement, biceps tenodesis, mini open subscapularis and supraspinatus repair at [facility] on Jan 12th with Dr. Marlou Sa.  She has already had the first dose of the Covid Vaccine on Dec 23rd. She is due for the 2nd dose Jan 13th (one day AFTER her shoulder surgery).  Patient would like to know if it would be better for her to have the second vaccine PRIOR to surgery.   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  She states Dr. Marlou Sa wanted her to avoid anything that would cause reinjury. Also please give estimate of time for recovery.     Please send to following email: [email or web address]   or call patient if she needs to pick up  cb  [phone] or [phone]

## 2019-11-25 NOTE — Telephone Encounter (Signed)
Tried calling patient to discuss. No answer. Will try to call again later. Note was done for patient yesterday through conversation in [place]. Need clarification on whether or not she is asking for something different.

## 2019-11-30 ENCOUNTER — Encounter (HOSPITAL_BASED_OUTPATIENT_CLINIC_OR_DEPARTMENT_OTHER): Payer: Self-pay | Admitting: Orthopedic Surgery

## 2019-11-30 ENCOUNTER — Other Ambulatory Visit: Payer: Self-pay

## 2019-12-01 ENCOUNTER — Encounter (HOSPITAL_BASED_OUTPATIENT_CLINIC_OR_DEPARTMENT_OTHER)
Admission: RE | Admit: 2019-12-01 | Discharge: 2019-12-01 | Disposition: A | Discharge status: Home / Self Care | Payer: 59 | Source: Ambulatory Visit | Attending: Orthopedic Surgery | Admitting: Orthopedic Surgery

## 2019-12-01 DIAGNOSIS — Z01818 Encounter for other preprocedural examination: Secondary | ICD-10-CM | POA: Diagnosis not present

## 2019-12-01 LAB — BASIC METABOLIC PANEL
Anion gap: 9 (ref 5–15)
BUN: 13 mg/dL (ref 6–20)
CO2: 25 mmol/L (ref 22–32)
Calcium: 8.9 mg/dL (ref 8.9–10.3)
Chloride: 104 mmol/L (ref 98–111)
Creatinine, Ser: 0.74 mg/dL (ref 0.44–1.00)
GFR calc Af Amer: >60 mL/min (ref >60)
GFR calc non Af Amer: >60 mL/min (ref >60)
Glucose, Bld: 122 mg/dL — ABNORMAL HIGH (ref 70–99)
Potassium: 4 mmol/L (ref 3.5–5.1)
Sodium: 138 mmol/L (ref 135–145)

## 2019-12-01 LAB — CBC
HCT: 39.3 % (ref 36.0–46.0)
Hemoglobin: 12.5 g/dL (ref 12.0–15.0)
MCH: 28.5 pg (ref 26.0–34.0)
MCHC: 31.8 g/dL (ref 30.0–36.0)
MCV: 89.7 fL (ref 80.0–100.0)
Platelets: 312 10*3/uL (ref 150–400)
RBC: 4.38 MIL/uL (ref 3.87–5.11)
RDW: 13 % (ref 11.5–15.5)
WBC: 6.2 10*3/uL (ref 4.0–10.5)
nRBC: 0 % (ref 0.0–0.2)

## 2019-12-01 NOTE — Progress Notes (Signed)

## 2019-12-02 ENCOUNTER — Encounter: Payer: Self-pay | Admitting: Orthopedic Surgery

## 2019-12-03 ENCOUNTER — Other Ambulatory Visit (HOSPITAL_COMMUNITY)
Admission: RE | Admit: 2019-12-03 | Discharge: 2019-12-03 | Disposition: A | Discharge status: Home / Self Care | Payer: 59 | Source: Ambulatory Visit | Attending: Orthopedic Surgery | Admitting: Orthopedic Surgery

## 2019-12-03 DIAGNOSIS — Z20822 Contact with and (suspected) exposure to covid-19: Secondary | ICD-10-CM | POA: Diagnosis not present

## 2019-12-03 DIAGNOSIS — Z01812 Encounter for preprocedural laboratory examination: Secondary | ICD-10-CM | POA: Insufficient documentation

## 2019-12-04 LAB — NOVEL CORONAVIRUS, NAA (HOSP ORDER, SEND-OUT TO REF LAB; TAT 18-24 HRS): SARS-CoV-2, NAA: NOT DETECTED

## 2019-12-06 NOTE — Anesthesia Preprocedure Evaluation (Addendum)
Anesthesia Evaluation  Patient identified by MRN, date of birth, ID band Patient awake    Reviewed: Allergy & Precautions, NPO status , Patient's Chart, lab work & pertinent test results  Airway Mallampati: II  TM Distance: >3 FB Neck ROM: Full    Dental no notable dental hx. (+) Teeth Intact, Dental Advisory Given   Pulmonary former smoker,    Pulmonary exam normal breath sounds clear to auscultation       Cardiovascular hypertension, Pt. on medications Normal cardiovascular exam Rhythm:Regular Rate:Normal  EkG - Sr w RBB   Neuro/Psych negative psych ROS   GI/Hepatic Neg liver ROS, GERD  ,  Endo/Other  negative endocrine ROSHypothyroidism   Renal/GU K+ 4.0 Cr 0.74     Musculoskeletal negative musculoskeletal ROS (+)   Abdominal   Peds  Hematology  (+) anemia , Hgb 12.5 Plt 312   Anesthesia Other Findings   Reproductive/Obstetrics                            Anesthesia Physical Anesthesia Plan  ASA: II  Anesthesia Plan: General   Post-op Pain Management:  Regional for Post-op pain   Induction:   PONV Risk Score and Plan: 4 or greater and Treatment may vary due to age or medical condition, Ondansetron, Dexamethasone and Midazolam  Airway Management Planned: Oral ETT  Additional Equipment: None  Intra-op Plan:   Post-operative Plan: Extubation in OR  Informed Consent: I have reviewed the patients History and Physical, chart, labs and discussed the procedure including the risks, benefits and alternatives for the proposed anesthesia with the patient or authorized representative who has indicated his/her understanding and acceptance.     Dental advisory given  Plan Discussed with: CRNA  Anesthesia Plan Comments: (Ga w R ISB w exparel)       Anesthesia Quick Evaluation

## 2019-12-07 ENCOUNTER — Encounter (HOSPITAL_BASED_OUTPATIENT_CLINIC_OR_DEPARTMENT_OTHER)
Admission: RE | Disposition: A | Discharge status: Home / Self Care | Payer: Self-pay | Source: Home / Self Care | Attending: Orthopedic Surgery

## 2019-12-07 ENCOUNTER — Other Ambulatory Visit: Payer: Self-pay

## 2019-12-07 ENCOUNTER — Ambulatory Visit (HOSPITAL_BASED_OUTPATIENT_CLINIC_OR_DEPARTMENT_OTHER): Payer: 59 | Admitting: Anesthesiology

## 2019-12-07 ENCOUNTER — Ambulatory Visit (HOSPITAL_BASED_OUTPATIENT_CLINIC_OR_DEPARTMENT_OTHER)
Admission: RE | Admit: 2019-12-07 | Discharge: 2019-12-07 | Disposition: A | Discharge status: Home / Self Care | Payer: 59 | Attending: Orthopedic Surgery | Admitting: Orthopedic Surgery

## 2019-12-07 ENCOUNTER — Encounter (HOSPITAL_BASED_OUTPATIENT_CLINIC_OR_DEPARTMENT_OTHER): Payer: Self-pay | Admitting: Orthopedic Surgery

## 2019-12-07 DIAGNOSIS — K219 Gastro-esophageal reflux disease without esophagitis: Secondary | ICD-10-CM | POA: Diagnosis not present

## 2019-12-07 DIAGNOSIS — M75111 Incomplete rotator cuff tear or rupture of right shoulder, not specified as traumatic: Secondary | ICD-10-CM | POA: Diagnosis not present

## 2019-12-07 DIAGNOSIS — Z79890 Hormone replacement therapy (postmenopausal): Secondary | ICD-10-CM | POA: Insufficient documentation

## 2019-12-07 DIAGNOSIS — M75101 Unspecified rotator cuff tear or rupture of right shoulder, not specified as traumatic: Secondary | ICD-10-CM | POA: Diagnosis not present

## 2019-12-07 DIAGNOSIS — S46111A Strain of muscle, fascia and tendon of long head of biceps, right arm, initial encounter: Secondary | ICD-10-CM | POA: Insufficient documentation

## 2019-12-07 DIAGNOSIS — Z87891 Personal history of nicotine dependence: Secondary | ICD-10-CM | POA: Insufficient documentation

## 2019-12-07 DIAGNOSIS — E039 Hypothyroidism, unspecified: Secondary | ICD-10-CM | POA: Diagnosis not present

## 2019-12-07 DIAGNOSIS — W19XXXA Unspecified fall, initial encounter: Secondary | ICD-10-CM | POA: Insufficient documentation

## 2019-12-07 DIAGNOSIS — E785 Hyperlipidemia, unspecified: Secondary | ICD-10-CM | POA: Insufficient documentation

## 2019-12-07 DIAGNOSIS — S46011D Strain of muscle(s) and tendon(s) of the rotator cuff of right shoulder, subsequent encounter: Secondary | ICD-10-CM | POA: Diagnosis not present

## 2019-12-07 DIAGNOSIS — S43421A Sprain of right rotator cuff capsule, initial encounter: Secondary | ICD-10-CM | POA: Insufficient documentation

## 2019-12-07 DIAGNOSIS — I10 Essential (primary) hypertension: Secondary | ICD-10-CM | POA: Insufficient documentation

## 2019-12-07 DIAGNOSIS — G8918 Other acute postprocedural pain: Secondary | ICD-10-CM | POA: Diagnosis not present

## 2019-12-07 DIAGNOSIS — Z79899 Other long term (current) drug therapy: Secondary | ICD-10-CM | POA: Diagnosis not present

## 2019-12-07 DIAGNOSIS — M7521 Bicipital tendinitis, right shoulder: Secondary | ICD-10-CM | POA: Diagnosis not present

## 2019-12-07 DIAGNOSIS — S43391A Subluxation of other parts of right shoulder girdle, initial encounter: Secondary | ICD-10-CM | POA: Diagnosis not present

## 2019-12-07 HISTORY — PX: BICEPT TENODESIS: SHX5116

## 2019-12-07 HISTORY — PX: SHOULDER ARTHROSCOPY WITH OPEN ROTATOR CUFF REPAIR: SHX6092

## 2019-12-07 SURGERY — ARTHROSCOPY, SHOULDER WITH REPAIR, ROTATOR CUFF, OPEN
Anesthesia: General | Site: Shoulder | Laterality: Right

## 2019-12-07 MED ORDER — CEFAZOLIN SODIUM-DEXTROSE 2-4 GM/100ML-% IV SOLN
2.0000 g | INTRAVENOUS | Status: AC
  Administered 2019-12-07: 08:00:00 2 g via INTRAVENOUS

## 2019-12-07 MED ORDER — DEXAMETHASONE SODIUM PHOSPHATE 10 MG/ML IJ SOLN
INTRAMUSCULAR | Status: DC | PRN
  Administered 2019-12-07: 10 mg via INTRAVENOUS

## 2019-12-07 MED ORDER — SUGAMMADEX SODIUM 200 MG/2ML IV SOLN
INTRAVENOUS | Status: DC | PRN
  Administered 2019-12-07: 200 mg via INTRAVENOUS

## 2019-12-07 MED ORDER — MIDAZOLAM HCL 2 MG/2ML IJ SOLN
INTRAMUSCULAR | Status: AC
  Filled 2019-12-07: qty 2

## 2019-12-07 MED ORDER — BUPIVACAINE HCL (PF) 0.5 % IJ SOLN
INTRAMUSCULAR | Status: DC | PRN
  Administered 2019-12-07: 15 mL via PERINEURAL

## 2019-12-07 MED ORDER — LIDOCAINE 2% (20 MG/ML) 5 ML SYRINGE
INTRAMUSCULAR | Status: AC
  Filled 2019-12-07: qty 5

## 2019-12-07 MED ORDER — PROPOFOL 10 MG/ML IV BOLUS
INTRAVENOUS | Status: DC | PRN
  Administered 2019-12-07: 140 mg via INTRAVENOUS

## 2019-12-07 MED ORDER — PROMETHAZINE HCL 25 MG/ML IJ SOLN: 6.2500 mg | INTRAMUSCULAR | Status: DC | PRN

## 2019-12-07 MED ORDER — FENTANYL CITRATE (PF) 100 MCG/2ML IJ SOLN
INTRAMUSCULAR | Status: AC
  Filled 2019-12-07: qty 2

## 2019-12-07 MED ORDER — PHENYLEPHRINE HCL-NACL 10-0.9 MG/250ML-% IV SOLN
INTRAVENOUS | Status: DC | PRN
  Administered 2019-12-07: 20 ug/min via INTRAVENOUS

## 2019-12-07 MED ORDER — OXYCODONE HCL 5 MG PO TABS: 5.0000 mg | ORAL_TABLET | ORAL | 0 refills | Status: DC | PRN

## 2019-12-07 MED ORDER — DEXAMETHASONE SODIUM PHOSPHATE 10 MG/ML IJ SOLN
INTRAMUSCULAR | Status: AC
  Filled 2019-12-07: qty 1

## 2019-12-07 MED ORDER — DEXMEDETOMIDINE HCL IN NACL 200 MCG/50ML IV SOLN
INTRAVENOUS | Status: AC
  Filled 2019-12-07: qty 50

## 2019-12-07 MED ORDER — ROCURONIUM BROMIDE 50 MG/5ML IV SOSY
PREFILLED_SYRINGE | INTRAVENOUS | Status: DC | PRN
  Administered 2019-12-07: 40 mg via INTRAVENOUS

## 2019-12-07 MED ORDER — LACTATED RINGERS IV SOLN: INTRAVENOUS | Status: DC

## 2019-12-07 MED ORDER — PROPOFOL 500 MG/50ML IV EMUL
INTRAVENOUS | Status: AC
  Filled 2019-12-07: qty 50

## 2019-12-07 MED ORDER — SODIUM CHLORIDE 0.9 % IR SOLN
Status: DC | PRN
  Administered 2019-12-07: 10:00:00 6000 mL

## 2019-12-07 MED ORDER — ONDANSETRON HCL 4 MG/2ML IJ SOLN
INTRAMUSCULAR | Status: AC
  Filled 2019-12-07: qty 2

## 2019-12-07 MED ORDER — METHOCARBAMOL 500 MG PO TABS: 500.0000 mg | ORAL_TABLET | Freq: Three times a day (TID) | ORAL | 0 refills | Status: DC | PRN

## 2019-12-07 MED ORDER — BUPIVACAINE LIPOSOME 1.3 % IJ SUSP
INTRAMUSCULAR | Status: DC | PRN
  Administered 2019-12-07: 10 mL via PERINEURAL

## 2019-12-07 MED ORDER — CEFAZOLIN SODIUM-DEXTROSE 2-4 GM/100ML-% IV SOLN
INTRAVENOUS | Status: AC
  Filled 2019-12-07: qty 100

## 2019-12-07 MED ORDER — HYDROCODONE-ACETAMINOPHEN 7.5-325 MG PO TABS: 1.0000 | ORAL_TABLET | Freq: Once | ORAL | Status: DC | PRN

## 2019-12-07 MED ORDER — ONDANSETRON HCL 4 MG/2ML IJ SOLN
INTRAMUSCULAR | Status: DC | PRN
  Administered 2019-12-07: 4 mg via INTRAVENOUS

## 2019-12-07 MED ORDER — LIDOCAINE 2% (20 MG/ML) 5 ML SYRINGE
INTRAMUSCULAR | Status: DC | PRN
  Administered 2019-12-07: 100 mg via INTRAVENOUS

## 2019-12-07 MED ORDER — ROCURONIUM BROMIDE 10 MG/ML (PF) SYRINGE
PREFILLED_SYRINGE | INTRAVENOUS | Status: AC
  Filled 2019-12-07: qty 10

## 2019-12-07 MED ORDER — EPHEDRINE 5 MG/ML INJ
INTRAVENOUS | Status: AC
  Filled 2019-12-07: qty 10

## 2019-12-07 MED ORDER — FENTANYL CITRATE (PF) 100 MCG/2ML IJ SOLN
INTRAMUSCULAR | Status: DC | PRN
  Administered 2019-12-07: 100 ug via INTRAVENOUS

## 2019-12-07 MED ORDER — PHENYLEPHRINE 40 MCG/ML (10ML) SYRINGE FOR IV PUSH (FOR BLOOD PRESSURE SUPPORT)
PREFILLED_SYRINGE | INTRAVENOUS | Status: AC
  Filled 2019-12-07: qty 10

## 2019-12-07 MED ORDER — DEXMEDETOMIDINE HCL IN NACL 200 MCG/50ML IV SOLN
INTRAVENOUS | Status: DC | PRN
  Administered 2019-12-07 (×2): 8 ug via INTRAVENOUS
  Administered 2019-12-07: 4 ug via INTRAVENOUS

## 2019-12-07 MED ORDER — PHENYLEPHRINE 40 MCG/ML (10ML) SYRINGE FOR IV PUSH (FOR BLOOD PRESSURE SUPPORT)
PREFILLED_SYRINGE | INTRAVENOUS | Status: DC | PRN
  Administered 2019-12-07 (×2): 80 ug via INTRAVENOUS
  Administered 2019-12-07: 40 ug via INTRAVENOUS

## 2019-12-07 MED ORDER — MIDAZOLAM HCL 2 MG/2ML IJ SOLN
1.0000 mg | INTRAMUSCULAR | Status: DC | PRN
  Administered 2019-12-07: 07:00:00 2 mg via INTRAVENOUS

## 2019-12-07 MED ORDER — CHLORHEXIDINE GLUCONATE 4 % EX LIQD: 60.0000 mL | Freq: Once | CUTANEOUS | Status: DC

## 2019-12-07 MED ORDER — ACETAMINOPHEN 10 MG/ML IV SOLN: 1000.0000 mg | Freq: Once | INTRAVENOUS | Status: DC | PRN

## 2019-12-07 MED ORDER — HYDROMORPHONE HCL 1 MG/ML IJ SOLN: 0.2500 mg | INTRAMUSCULAR | Status: DC | PRN

## 2019-12-07 MED ORDER — EPHEDRINE SULFATE-NACL 50-0.9 MG/10ML-% IV SOSY
PREFILLED_SYRINGE | INTRAVENOUS | Status: DC | PRN
  Administered 2019-12-07: 10 mg via INTRAVENOUS
  Administered 2019-12-07: 5 mg via INTRAVENOUS
  Administered 2019-12-07: 10 mg via INTRAVENOUS

## 2019-12-07 MED ORDER — ASPIRIN EC 81 MG PO TBEC: 81.0000 mg | DELAYED_RELEASE_TABLET | Freq: Every day | ORAL | 0 refills | Status: DC

## 2019-12-07 MED ORDER — FENTANYL CITRATE (PF) 100 MCG/2ML IJ SOLN
50.0000 ug | INTRAMUSCULAR | Status: DC | PRN
  Administered 2019-12-07: 100 ug via INTRAVENOUS

## 2019-12-07 MED ORDER — PHENYLEPHRINE HCL (PRESSORS) 10 MG/ML IV SOLN
INTRAVENOUS | Status: AC
  Filled 2019-12-07: qty 2

## 2019-12-07 MED ORDER — EPINEPHRINE PF 1 MG/ML IJ SOLN
INTRAMUSCULAR | Status: AC
  Filled 2019-12-07: qty 6

## 2019-12-07 MED ORDER — MEPERIDINE HCL 25 MG/ML IJ SOLN: 6.2500 mg | INTRAMUSCULAR | Status: DC | PRN

## 2019-12-07 MED FILL — METHOCARBAMOL 500 MG TABS: 500 | 10 days supply | Qty: 30 | Fill #0

## 2019-12-07 MED FILL — oxyCODONE HCL 5 MG TABS: 5 | 7 days supply | Qty: 40 | Fill #0

## 2019-12-07 SURGICAL SUPPLY — 106 items
AID PSTN UNV HD RSTRNT DISP (MISCELLANEOUS) ×2
ANCH SUT FBRTK 1.3 2 TPE (Anchor) ×6 IMPLANT
ANCH SUT SWLK 19.1X4.75 (Anchor) ×6 IMPLANT
ANCHOR FBRTK 2.6 SUTURETAP 1.3 (Anchor) ×9 IMPLANT
ANCHOR SUT 1.8 FBRTK KNTLS 2SU (Anchor) ×6 IMPLANT
ANCHOR SUT BIO SW 4.75X19.1 (Anchor) ×9 IMPLANT
APL SKNCLS STERI-STRIP NONHPOA (GAUZE/BANDAGES/DRESSINGS) ×2
BENZOIN TINCTURE PRP APPL 2/3 (GAUZE/BANDAGES/DRESSINGS) ×3 IMPLANT
BLADE EXCALIBUR 4.0MM X 13CM (MISCELLANEOUS)
BLADE EXCALIBUR 4.0X13 (MISCELLANEOUS) IMPLANT
BLADE SHAVER BONE 5.0MM X 13CM (MISCELLANEOUS)
BLADE SHAVER BONE 5.0X13 (MISCELLANEOUS) IMPLANT
BLADE SURG 10 STRL SS (BLADE) ×3 IMPLANT
BLADE SURG 15 STRL LF DISP TIS (BLADE) ×2 IMPLANT
BLADE SURG 15 STRL SS (BLADE) ×8
BURR OVAL 8 FLU 5.0MM X 13CM (MISCELLANEOUS) ×1
BURR OVAL 8 FLU 5.0X13 (MISCELLANEOUS) ×2 IMPLANT
CANNULA 5.75X71 LONG (CANNULA) IMPLANT
CANNULA TWIST IN 8.25X7CM (CANNULA) IMPLANT
CLEANER CAUTERY TIP 5X5 PAD (MISCELLANEOUS) IMPLANT
CLOSURE STERI-STRIP 1/2X4 (GAUZE/BANDAGES/DRESSINGS) ×1
CLSR STERI-STRIP ANTIMIC 1/2X4 (GAUZE/BANDAGES/DRESSINGS) ×2 IMPLANT
COVER WAND RF STERILE (DRAPES) IMPLANT
DECANTER SPIKE VIAL GLASS SM (MISCELLANEOUS) IMPLANT
DISSECTOR  3.8MM X 13CM (MISCELLANEOUS)
DISSECTOR 3.8MM X 13CM (MISCELLANEOUS) IMPLANT
DISSECTOR 4.0MM X 13CM (MISCELLANEOUS) ×4 IMPLANT
DRAPE HALF SHEET 70X43 (DRAPES) IMPLANT
DRAPE IMP U-DRAPE 54X76 (DRAPES) ×4 IMPLANT
DRAPE INCISE IOBAN 66X45 STRL (DRAPES) ×7 IMPLANT
DRAPE STERI 35X30 U-POUCH (DRAPES) ×4 IMPLANT
DRAPE U-SHAPE 47X51 STRL (DRAPES) ×10 IMPLANT
DRAPE U-SHAPE 76X120 STRL (DRAPES) ×8 IMPLANT
DRSG AQUACEL AG ADV 3.5X 6 (GAUZE/BANDAGES/DRESSINGS) ×4 IMPLANT
DRSG TEGADERM 4X4.75 (GAUZE/BANDAGES/DRESSINGS) ×6 IMPLANT
DURAPREP 26ML APPLICATOR (WOUND CARE) ×4 IMPLANT
DW OUTFLOW CASSETTE/TUBE SET (MISCELLANEOUS) ×1 IMPLANT
ELECT NDL TIP 2.8 STRL (NEEDLE) IMPLANT
ELECT NEEDLE TIP 2.8 STRL (NEEDLE) IMPLANT
ELECT REM PT RETURN 9FT ADLT (ELECTROSURGICAL) ×4
ELECTRODE REM PT RTRN 9FT ADLT (ELECTROSURGICAL) ×2 IMPLANT
EXCALIBUR 3.8MM X 13CM (MISCELLANEOUS) IMPLANT
GAUZE SPONGE 4X4 12PLY STRL (GAUZE/BANDAGES/DRESSINGS) ×7 IMPLANT
GAUZE XEROFORM 1X8 LF (GAUZE/BANDAGES/DRESSINGS) ×4 IMPLANT
GLOVE BIO SURGEON STRL SZ7 (GLOVE) ×4 IMPLANT
GLOVE BIOGEL PI IND STRL 7.0 (GLOVE) ×6 IMPLANT
GLOVE BIOGEL PI IND STRL 8 (GLOVE) ×2 IMPLANT
GLOVE BIOGEL PI INDICATOR 7.0 (GLOVE) ×10
GLOVE BIOGEL PI INDICATOR 8 (GLOVE) ×2
GLOVE ECLIPSE 6.5 STRL STRAW (GLOVE) ×9 IMPLANT
GLOVE SURG ORTHO 8.0 STRL STRW (GLOVE) ×4 IMPLANT
GOWN STRL REUS W/ TWL LRG LVL3 (GOWN DISPOSABLE) ×6 IMPLANT
GOWN STRL REUS W/ TWL XL LVL3 (GOWN DISPOSABLE) ×2 IMPLANT
GOWN STRL REUS W/TWL LRG LVL3 (GOWN DISPOSABLE) ×16
GOWN STRL REUS W/TWL XL LVL3 (GOWN DISPOSABLE) ×4
KIT BIO-TENODESIS 3X8 DISP (MISCELLANEOUS)
KIT INSRT BABSR STRL DISP BTN (MISCELLANEOUS) IMPLANT
KIT STR SPEAR 1.8 FBRTK DISP (KITS) ×3 IMPLANT
MANIFOLD NEPTUNE II (INSTRUMENTS) ×4 IMPLANT
NDL SAFETY ECLIPSE 18X1.5 (NEEDLE) ×3 IMPLANT
NDL SCORPION MULTI FIRE (NEEDLE) IMPLANT
NDL SPNL 18GX3.5 QUINCKE PK (NEEDLE) IMPLANT
NDL SUT 6 .5 CRC .975X.05 MAYO (NEEDLE) ×1 IMPLANT
NEEDLE HYPO 18GX1.5 SHARP (NEEDLE) ×4
NEEDLE MAYO TAPER (NEEDLE) ×4
NEEDLE SCORPION MULTI FIRE (NEEDLE) ×4 IMPLANT
NEEDLE SPNL 18GX3.5 QUINCKE PK (NEEDLE) IMPLANT
NS IRRIG 1000ML POUR BTL (IV SOLUTION) IMPLANT
PACK ARTHROSCOPY DSU (CUSTOM PROCEDURE TRAY) ×4 IMPLANT
PACK BASIN DAY SURGERY FS (CUSTOM PROCEDURE TRAY) ×4 IMPLANT
PAD CLEANER CAUTERY TIP 5X5 (MISCELLANEOUS)
PAD ORTHO SHOULDER 7X19 LRG (SOFTGOODS) ×3 IMPLANT
PENCIL SMOKE EVACUATOR (MISCELLANEOUS) ×4 IMPLANT
PORT APPOLLO RF 90DEGREE MULTI (SURGICAL WAND) ×4 IMPLANT
RESTRAINT HEAD UNIVERSAL NS (MISCELLANEOUS) ×4 IMPLANT
SLEEVE SCD COMPRESS KNEE MED (MISCELLANEOUS) ×4 IMPLANT
SLING ARM FOAM STRAP LRG (SOFTGOODS) IMPLANT
SPONGE LAP 4X18 RFD (DISPOSABLE) ×12 IMPLANT
SPONGE SURGIFOAM ABS GEL 12-7 (HEMOSTASIS) IMPLANT
STAPLER VISISTAT 35W (STAPLE) IMPLANT
SUCTION FRAZIER HANDLE 10FR (MISCELLANEOUS) ×2
SUCTION TUBE FRAZIER 10FR DISP (MISCELLANEOUS) ×2 IMPLANT
SUT BONE WAX W31G (SUTURE) IMPLANT
SUT ETHIBOND 2 OS 4 DA (SUTURE) IMPLANT
SUT ETHILON 3 0 PS 1 (SUTURE) ×4 IMPLANT
SUT FIBERWIRE #2 38 T-5 BLUE (SUTURE)
SUT FIBERWIRE 2-0 18 17.9 3/8 (SUTURE)
SUT MNCRL AB 3-0 PS2 18 (SUTURE) ×3 IMPLANT
SUT PDS AB 0 CT 36 (SUTURE) IMPLANT
SUT TICRON 1 T 12 (SUTURE) IMPLANT
SUT VIC AB 1 CT1 27 (SUTURE)
SUT VIC AB 1 CT1 27XBRD ANBCTR (SUTURE) IMPLANT
SUT VIC AB 2-0 SH 27 (SUTURE) ×8
SUT VIC AB 2-0 SH 27XBRD (SUTURE) ×2 IMPLANT
SUT VICRYL 0 UR6 27IN ABS (SUTURE) ×24 IMPLANT
SUT VICRYL 4-0 PS2 18IN ABS (SUTURE) ×4 IMPLANT
SUTURE FIBERWR #2 38 T-5 BLUE (SUTURE) IMPLANT
SUTURE FIBERWR 2-0 18 17.9 3/8 (SUTURE) IMPLANT
SYR 5ML LL (SYRINGE) ×3 IMPLANT
SYR BULB 3OZ (MISCELLANEOUS) ×3 IMPLANT
TOWEL GREEN STERILE FF (TOWEL DISPOSABLE) ×4 IMPLANT
TRAY DSU PREP LF (CUSTOM PROCEDURE TRAY) ×1 IMPLANT
TUBE CONNECTING 20'X1/4 (TUBING) ×1
TUBE CONNECTING 20X1/4 (TUBING) ×3 IMPLANT
TUBING ARTHROSCOPY IRRIG 16FT (MISCELLANEOUS) ×4 IMPLANT
YANKAUER SUCT BULB TIP NO VENT (SUCTIONS) ×4 IMPLANT

## 2019-12-07 NOTE — Transfer of Care (Signed)
Immediate Anesthesia Transfer of Care Note  Patient: [name]  Procedure(s) Performed: RIGHT SHOULDER ARTHROSCOPY,   MINI OPEN SUBSCAPULARIS, AND SUPRASPINATUS REPAIR, OPEN BICEPS TENODESIS (Right Shoulder) BICEPS TENODESIS (Right Shoulder)  Patient Location: PACU  Anesthesia Type:GA combined with regional for post-op pain  Level of Consciousness: awake, alert  and oriented  Airway & Oxygen Therapy: Patient Spontanous Breathing and Patient connected to face mask oxygen  Post-op Assessment: Report given to RN and Post -op Vital signs reviewed and stable  Post vital signs: Reviewed and stable  Last Vitals:  Vitals Value Taken Time  BP 102/54 12/07/19 1048  Temp    Pulse 87 12/07/19 1049  Resp 11 12/07/19 1049  SpO2 97 % 12/07/19 1049  Vitals shown include unvalidated device data.  Last Pain:  Vitals:   12/07/19 0643  TempSrc: Oral  PainSc: 4       Patients Stated Pain Goal: 4 ([id number])  Complications: No apparent anesthesia complications

## 2019-12-07 NOTE — H&P (Signed)
[name] is an 59 y.o. female.   Chief Complaint: Right shoulder pain HPI: [name] is a patient with right shoulder pain of insidious onset.  Reports right shoulder pain which has been fairly debilitating at times but other times she has had reasonably good function.  MRI scan shows tear of the supraspinatus with retraction as well as tear of the subscap upper portion with medial subluxation of the biceps tendon.  Patient is failed conservative measures and presents now for operative management after explanation of risks and benefits.  Past Medical History:  Diagnosis Date  . Anemia 2007   due to fibroids  . Duodenitis without hemorrhage   . Esophageal stricture   . Esophagitis   . GERD (gastroesophageal reflux disease)   . Hiatal hernia   . Hyperlipidemia   . Hypertension   . Hypothyroidism   . Leiomyoma of uterus, unspecified    fibroids  . Pneumonia    aspiration pneumonia after balloon surgery for weight loss    Past Surgical History:  Procedure Laterality Date  . CARPAL TUNNEL RELEASE  2014   right wrist  . CHOLECYSTECTOMY    . HYSTEROSCOPY W/ ENDOMETRIAL ABLATION  4/05  . RECTOCELE REPAIR  2007  . TONSILLECTOMY     as child  . TOTAL VAGINAL HYSTERECTOMY  5/07  . TUBAL LIGATION  1991   BILATERAL    Family History  Problem Relation Age of Onset  . Celiac disease Mother   . Osteoporosis Mother   . Heart disease Father   . Stroke Father   . Irritable bowel syndrome Sister   . Lung cancer Maternal Grandmother   . Breast cancer Paternal Grandmother   . Heart disease Sister   . Melanoma Sister   . Colon cancer Neg Hx   . Esophageal cancer Neg Hx   . Stomach cancer Neg Hx   . Rectal cancer Neg Hx    Social History:  reports that she quit smoking about 16 years ago. She has never used smokeless tobacco. She reports current alcohol use. She reports that she does not use drugs.  Allergies: No Known Allergies  Medications Prior to Admission  Medication Sig  Dispense Refill  . acyclovir (ZOVIRAX) 400 MG tablet Take 1 tablet (400 mg total) by mouth 2 (two) times daily. (Patient taking differently: Take 400 mg by mouth every Monday, Wednesday, and Friday. ) 180 tablet 2  . Cholecalciferol (VITAMIN D) 2000 units tablet Take 2,000 Units by mouth 2 (two) times daily.    . DULoxetine (CYMBALTA) 30 MG capsule Take 30 mg by mouth daily.    Marland Kitchen levothyroxine (SYNTHROID, LEVOTHROID) 75 MCG tablet Take 37.5-75 mcg by mouth See admin instructions. 75 mcg on all days except Tuesday patient takes 37.5 mcg ( 1/2 of 75 mcg tablet)    . losartan (COZAAR) 100 MG tablet Take 100 mg by mouth daily.    . Melatonin 5 MG CAPS Take 5 mg by mouth at bedtime.    . Multiple Vitamin (MULTIVITAMIN) capsule Take 1 capsule by mouth daily.    Marland Kitchen omeprazole (PRILOSEC) 40 MG capsule Take 1 capsule (40 mg total) by mouth 2 (two) times daily. 180 capsule 3  . pramipexole (MIRAPEX) 0.125 MG tablet Take 1 tablet by mouth at bedtime.  3  . PREMARIN 0.625 MG tablet TAKE 1 TABLET (0.625 MG TOTAL) BY MOUTH DAILY. 90 tablet 1  . rosuvastatin (CRESTOR) 10 MG tablet Take 10 mg by mouth daily.    Marland Kitchen  vitamin B-12 (CYANOCOBALAMIN) 100 MCG tablet Take 100 mcg by mouth daily.      No results found for this or any previous visit (from the past 48 hour(s)). No results found.  Review of Systems  Musculoskeletal: Positive for arthralgias.  All other systems reviewed and are negative.   Blood pressure 112/67, pulse 68, temperature 97.7 F (36.5 C), temperature source Oral, resp. rate 10, height 5\' 5"  (1.651 m), weight 86.6 kg, last menstrual period 11/25/2005, SpO2 99 %. Physical Exam  Constitutional: She appears well-developed.  HENT:  Head: Normocephalic.  Eyes: Pupils are equal, round, and reactive to light.  Cardiovascular: Normal rate.  Respiratory: Effort normal.  Musculoskeletal:     Cervical back: Normal range of motion.  Neurological: She is alert.  Skin: Skin is warm.  Psychiatric:  She has a normal mood and affect.  Examination of the right shoulder demonstrates some supraspinatus weakness with mild subscap weakness.  No discrete AC joint tenderness is present.  Passive range of motion of the shoulder is maintained.  Motor or sensory function of the hand is intact  Assessment/Plan Impression is right shoulder rotator cuff tear with medial subluxation of the biceps tendon.  Upper subscapularis appears to be torn but not retracted.  Supraspinatus also torn and retracted about a centimeter and a half.  Plan is arthroscopy with biceps tendon release superior labral debridement and mini open rotator cuff repair possibly through deltopectoral approach depending on how the mobile the subscap is.  Risk benefits are discussed including not limited to infection nerve vessel damage shoulder stiffness as well as prolonged rehabilitative process involved.  All questions answered  Anderson Malta, MD 12/07/2019, 7:28 AM

## 2019-12-07 NOTE — Progress Notes (Signed)
AssistedDr. Houser with right, ultrasound guided, interscalene  block. Side rails up, monitors on throughout procedure. See vital signs in flow sheet. Tolerated Procedure well.  

## 2019-12-07 NOTE — Discharge Instructions (Signed)
Post Anesthesia Home Care Instructions  Activity: Get plenty of rest for the remainder of the day. A responsible individual must stay with you for 24 hours following the procedure.  For the next 24 hours, DO NOT: -Drive a car -Operate machinery -Drink alcoholic beverages -Take any medication unless instructed by your physician -Make any legal decisions or sign important papers.  Meals: Start with liquid foods such as gelatin or soup. Progress to regular foods as tolerated. Avoid greasy, spicy, heavy foods. If nausea and/or vomiting occur, drink only clear liquids until the nausea and/or vomiting subsides. Call your physician if vomiting continues.  Special Instructions/Symptoms: Your throat may feel dry or sore from the anesthesia or the breathing tube placed in your throat during surgery. If this causes discomfort, gargle with warm salt water. The discomfort should disappear within 24 hours.  If you had a scopolamine patch placed behind your ear for the management of post- operative nausea and/or vomiting:  1. The medication in the patch is effective for 72 hours, after which it should be removed.  Wrap patch in a tissue and discard in the trash. Wash hands thoroughly with soap and water. 2. You may remove the patch earlier than 72 hours if you experience unpleasant side effects which may include dry mouth, dizziness or visual disturbances. 3. Avoid touching the patch. Wash your hands with soap and water after contact with the patch.      Regional Anesthesia Blocks  1. Numbness or the inability to move the "blocked" extremity may last from 3-48 hours after placement. The length of time depends on the medication injected and your individual response to the medication. If the numbness is not going away after 48 hours, call your surgeon.  2. The extremity that is blocked will need to be protected until the numbness is gone and the  Strength has returned. Because you cannot feel it, you  will need to take extra care to avoid injury. Because it may be weak, you may have difficulty moving it or using it. You may not know what position it is in without looking at it while the block is in effect.  3. For blocks in the legs and feet, returning to weight bearing and walking needs to be done carefully. You will need to wait until the numbness is entirely gone and the strength has returned. You should be able to move your leg and foot normally before you try and bear weight or walk. You will need someone to be with you when you first try to ensure you do not fall and possibly risk injury.  4. Bruising and tenderness at the needle site are common side effects and will resolve in a few days.  5. Persistent numbness or new problems with movement should be communicated to the surgeon or the [facility] ([phone])/ [facility] ([phone]).  Information for Discharge Teaching: EXPAREL (bupivacaine liposome injectable suspension)   Your surgeon or anesthesiologist gave you EXPAREL(bupivacaine) to help control your pain after surgery.   EXPAREL is a local anesthetic that provides pain relief by numbing the tissue around the surgical site.  EXPAREL is designed to release pain medication over time and can control pain for up to 72 hours.  Depending on how you respond to EXPAREL, you may require less pain medication during your recovery.  Possible side effects:  Temporary loss of sensation or ability to move in the area where bupivacaine was injected.  Nausea, vomiting, constipation  Rarely,   numbness and tingling in your mouth or lips, lightheadedness, or anxiety may occur.  Call your doctor right away if you think you may be experiencing any of these sensations, or if you have other questions regarding possible side effects.  Follow all other discharge instructions given to you by your surgeon or nurse. Eat a healthy diet and drink plenty of water or other  fluids.  If you return to the hospital for any reason within 96 hours following the administration of EXPAREL, it is important for health care providers to know that you have received this anesthetic. A teal colored band has been placed on your arm with the date, time and amount of EXPAREL you have received in order to alert and inform your health care providers. Please leave this armband in place for the full 96 hours following administration, and then you may remove the band. 

## 2019-12-07 NOTE — Anesthesia Postprocedure Evaluation (Signed)
Anesthesia Post Note  Patient: TRYSTYN BREYETTE  Procedure(s) Performed: RIGHT SHOULDER ARTHROSCOPY,   MINI OPEN SUBSCAPULARIS, AND SUPRASPINATUS REPAIR, OPEN BICEPS TENODESIS (Right Shoulder) BICEPS TENODESIS (Right Shoulder)     Patient location during evaluation: PACU Anesthesia Type: General Level of consciousness: awake and alert Pain management: pain level controlled Vital Signs Assessment: post-procedure vital signs reviewed and stable Respiratory status: spontaneous breathing, nonlabored ventilation, respiratory function stable and patient connected to nasal cannula oxygen Cardiovascular status: blood pressure returned to baseline and stable Postop Assessment: no apparent nausea or vomiting Anesthetic complications: no    Last Vitals:  Vitals:   12/07/19 1130 12/07/19 1145  BP: 107/76 124/80  Pulse: 100 (!) 103  Resp: (!) 21 18  Temp:  36.8 C  SpO2: 96% 97%    Last Pain:  Vitals:   12/07/19 1145  TempSrc:   PainSc: 0-No pain                 Barnet Glasgow

## 2019-12-07 NOTE — Op Note (Signed)
NAME: [name], [name] MEDICAL RECORD [id number] ACCOUNT [id number] DATE OF BIRTH:12/21/1960 FACILITY: [facility] LOCATION: [facility] PHYSICIAN:Brooke Diefendorf Randel Pigg, MD  OPERATIVE REPORT  DATE OF PROCEDURE:  12/07/2019  PREOPERATIVE DIAGNOSIS:  Right shoulder massive rotator cuff tear of the upper subscapularis and supraspinatus with retraction and medial subluxation of the biceps tendon.  POSTOPERATIVE DIAGNOSIS:  Right shoulder massive rotator cuff tear of the upper subscapularis and supraspinatus with retraction and medial subluxation of the biceps tendon.  PROCEDURES:   1.  Right shoulder arthroscopy with biceps tendon release. 2.  Limited debridement, superior labrum. 3.  Mini open biceps tendon tenodesis. 4.  Rotator cuff repair of subscapularis and infraspinatus.  SURGEON:  Brooke Pel, MD  ASSISTANT:  Brooke Main, PA  INDICATIONS:  The patient is a 59 year old patient with multiple falls and right shoulder rotator cuff pathology of approximately 1 year duration, presents now for operative management after explanation of risks and benefits.  MRI scan shows retracted  supraspinatus tear, along with biceps tendon tear and subluxation, as well as upper subscapularis tear with mild retraction.  PROCEDURE IN DETAIL:  The patient was brought to the operating room where general anesthetic was induced.  Preoperative antibiotics administered.  Timeout was called.  The patient was placed in the beach chair position with the head in neutral position.   Right arm prescrubbed with alcohol and Betadine and allowed to air dry.  Prepped with DuraPrep solution and draped in a sterile manner.  Examination under anesthesia demonstrated good stability, anterior and posterior with less than a centimeter sulcus  sign and no restriction of passive range of motion of external rotation, forward flexion or isolated glenohumeral abduction.  Following sterile prepping and draping, Ioban was used to cover the  axilla as well as seal the operative field.  Timeout was  called.  Posterior portal was created 2 cm medial and inferior to the posterolateral margin of the acromion.  Diagnostic arthroscopy was performed.  The patient did have medial subluxation of the biceps tendon, along with significant biceps tendon  fraying.  The biceps tendon was released and the superior labrum was debrided using Arthrocare wand.  Assessment was made of the intra-articular subscapularis which did have fraying, but was repairable and not excessively retracted.  The supraspinatus  was retracted about 1.5 to 2 cm as per MRI scanning.  Following limited debridement and biceps tendon release, Ioban was used to cover the entire operative field.  Anterior and posterior portals were closed using 3-0 nylon.  Incision made off the  anterolateral margin of the acromion.  Deltoid was split, measured distance of 4 cm from the anterolateral margin of the acromion, marked with a #1 Vicryl suture.  This was between the anterior and medial raphae.  At this time, the bicipital groove was  opened, but the biceps tendon was subluxated medially.  It was retrieved and then tenodesed using knotless Arthrex SutureTak at the inferior aspect of the bicipital groove.  Next, attention was directed towards the subscap.  Rotator interval tissue was  opened.  The subscapularis was torn and retracted less than a centimeter.  The bottom half of the lesser tuberosity still had some of the subscap and capsule attached.  At this time, the lesser tuberosity and the native subscapularis attachment was  prepared by using a knife to scrape the bone for bleeding bone.  One 2.6 knotless SutureTak with 2 FiberTape sutures was placed.  These 4 suture tape limbs were then placed through the subscapularis and  it was then tied down.  An extra knotless SutureTak  was used which was smaller in order to secure the uppermost portion of the subscapularis just medial to the upper  portion of the bicipital groove at its native attachment site.  This gave a very secure subscap repair.  The sutures were then placed into  a SwiveLock within the bicipital groove, which gave very good fixation of the subscap.  The superior aspect of the biceps tendon was then additionally fixated into this. SutureTak suture limb for 2 points of fixation.  This completed the anterior repair  and the biceps tenodesis.  Attention was then directed towards the supraspinatus.  Five 0 Vicryl sutures were placed into the edge of the supraspinatus.  The greater tuberosity was then prepared by using both a rongeur and a knife to create a bleeding  bony surface.  Two of the 2.6 knotless suture tacks with FiberTape attached were then placed.  Those 8 limbs were then placed through the supraspinatus tendon using the Scorpion suture passer.  This gave a nice watertight repair.  The limbs were tied and  crossed so that the 4 limbs were crossed, creating a mattress effect to hold the tendon down.  Two of the edge Vicryl sutures were placed with the suture tapes anteriorly and 3 of Vicryl Mason-Allen grasping sutures were placed with the other 4 suture  limbs which were then taken posteriorly.  With the arm in 30 degrees of abduction, the sutures were then placed into 2 SwiveLocks.  Good tension and fixation was achieved.  Rotator interval then closed using 0 Vicryl suture with the arm in 45 degrees of  external rotation.  Thorough irrigation was then performed.  Watertight repair was achieved.  Thorough irrigation again performed with 3 L of irrigating solution.  Deltoid split was then closed using 0 Vicryl suture, followed by interrupted inverted 2-0  Vicryl suture and 3-0 Monocryl.  Aquacel was used to cover the anterior incision.  An impervious dressing was then used to cover the portal sites as well.  [name]'s assistance was required for opening and closing, suture management, positioning of the arm.   His assistance  was a medical necessity.  The patient was placed into a shoulder immobilizer, transferred to the recovery room in stable condition.  VN/NUANCE  D:12/07/2019 T:12/07/2019 JOB:[id number]

## 2019-12-07 NOTE — Anesthesia Procedure Notes (Signed)
Procedure Name: Intubation Date/Time: 12/07/2019 7:42 AM Performed by: Genelle Bal, CRNA Pre-anesthesia Checklist: Patient identified, Emergency Drugs available, Suction available and Patient being monitored Patient Re-evaluated:Patient Re-evaluated prior to induction Oxygen Delivery Method: Circle system utilized Preoxygenation: Pre-oxygenation with 100% oxygen Induction Type: IV induction Ventilation: Mask ventilation without difficulty Laryngoscope Size: Miller and 2 Grade View: Grade I Tube type: Oral Tube size: 7.0 mm Number of attempts: 1 Airway Equipment and Method: Stylet and Oral airway Placement Confirmation: ETT inserted through vocal cords under direct vision,  positive ETCO2 and breath sounds checked- equal and bilateral Secured at: 21 cm Tube secured with: Tape Dental Injury: Teeth and Oropharynx as per pre-operative assessment

## 2019-12-07 NOTE — Anesthesia Procedure Notes (Signed)
Anesthesia Regional Block: Interscalene brachial plexus block   Pre-Anesthetic Checklist: ,, timeout performed, Correct Patient, Correct Site, Correct Laterality, Correct Procedure, Correct Position, site marked, Risks and benefits discussed,  Surgical consent,  Pre-op evaluation,  At surgeon's request and post-op pain management  Laterality: Upper and Right  Prep: Maximum Sterile Barrier Precautions used, chloraprep       Needles:  Injection technique: Single-shot  Needle Type: Echogenic Needle     Needle Length: 5cm  Needle Gauge: 21     Additional Needles:   Procedures:,,,, ultrasound used (permanent image in chart),,,,  Narrative:  Start time: 12/07/2019 7:01 AM End time: 12/07/2019 7:07 AM Injection made incrementally with aspirations every 5 mL.  Performed by: Personally  Anesthesiologist: Barnet Glasgow, MD  Additional Notes: Block assessed prior to procedure. Patient tolerated procedure well.

## 2019-12-07 NOTE — Brief Op Note (Signed)
   12/07/2019  10:38 AM  PATIENT:  [name]  59 y.o. female  PRE-OPERATIVE DIAGNOSIS:  RIGHT SHOULDER ROTATOR CUFF TEAR, BICEPS SUBLUXATION  POST-OPERATIVE DIAGNOSIS:  RIGHT SHOULDER ROTATOR CUFF TEAR of supraspinatus and subscapularis with medial subluxation of the biceps tendon, BICEPS SUBLUXATION  PROCEDURE:  Procedure(s): RIGHT SHOULDER ARTHROSCOPY,   MINI OPEN SUBSCAPULARIS, AND SUPRASPINATUS REPAIR, OPEN BICEPS TENODESIS BICEPS TENODESIS subacromial decompression and limited debridement of superior labrum  SURGEON:  Surgeon(s): Marlou Sa, Tonna Corner, MD  ASSISTANT: Annie Main, PA  ANESTHESIA:   general  EBL: 50 ml    Total I/O In: 1400 [I.V.:1400] Out: 30 [Blood:30]  BLOOD ADMINISTERED: none  DRAINS: none   LOCAL MEDICATIONS USED:  none  SPECIMEN:  No Specimen  COUNTS:  YES  TOURNIQUET:  * No tourniquets in log *  DICTATION: .Other Dictation: Dictation Number [id number]  PLAN OF CARE: Discharge to home after PACU  PATIENT DISPOSITION:  PACU - hemodynamically stable

## 2019-12-08 ENCOUNTER — Encounter: Payer: Self-pay | Admitting: *Deleted

## 2019-12-15 ENCOUNTER — Other Ambulatory Visit: Payer: Self-pay

## 2019-12-15 ENCOUNTER — Encounter: Payer: Self-pay | Admitting: Orthopedic Surgery

## 2019-12-15 ENCOUNTER — Ambulatory Visit (INDEPENDENT_AMBULATORY_CARE_PROVIDER_SITE_OTHER): Payer: 59 | Admitting: Orthopedic Surgery

## 2019-12-15 VITALS — Ht 65.0 in | Wt 184.0 lb

## 2019-12-15 DIAGNOSIS — S46011D Strain of muscle(s) and tendon(s) of the rotator cuff of right shoulder, subsequent encounter: Secondary | ICD-10-CM

## 2019-12-15 MED ORDER — METHOCARBAMOL 500 MG PO TABS: 500.0000 mg | ORAL_TABLET | Freq: Three times a day (TID) | ORAL | 0 refills | Status: DC | PRN

## 2019-12-15 MED ORDER — OXYCODONE HCL 5 MG PO TABS: 5.0000 mg | ORAL_TABLET | Freq: Three times a day (TID) | ORAL | 0 refills | Status: DC | PRN

## 2019-12-15 NOTE — Progress Notes (Signed)
Post-Op Visit Note   Patient: [name]           Date of Birth: Nov 08, 1961           MRN: [id number] Visit Date: 12/15/2019 PCP: Crist Infante, MD   Assessment & Plan:  Chief Complaint:  Chief Complaint  Patient presents with  . Right Shoulder - Routine Post Op    12/07/2019 Right Shoulder Scope, Mini Open Subscapularis and Supraspinatus Repair, Open Biceps Tenodesis   Visit Diagnoses:  1. Traumatic complete tear of right rotator cuff, subsequent encounter     Plan: [name] is a 59 year old female who presents s/p right shoulder arthroscopy with mini open subscapularis and supraspinatus tendon repairs and biceps tenodesis on 12/07/2019.  Patient notes that she is making steady improvement.  She still has some muscle spasms occasionally but notes that pain is continuing to improve.  She has been compliant with using the CPM machine as directed and is up to 60 degrees.  She is taking oxycodone about twice a day as well as Robaxin.  Refills were provided of these medications.  Incisions are healing well.  She is at 60 degrees of abduction, 70 degrees of forward flexion, 15 degrees of external rotation.  Sutures removed today.  She is compliant with taking aspirin.  Plan to continue using the CPM machine for 1 hour 3 times a day.  Discontinue sling 1 week from the Saturday.  Continue with no lifting of the right arm.  Plan to return to the clinic in 2 weeks.  Follow-Up Instructions: No follow-ups on file.   Orders:  No orders of the defined types were placed in this encounter.  No orders of the defined types were placed in this encounter.   Imaging: No results found.  PMFS History: Patient Active Problem List   Diagnosis Date Noted  . Morbid obesity ([place]) 09/16/2012  . HYPERLIPIDEMIA 09/06/2008  . UTI 09/06/2008  . FIBROMYALGIA 09/06/2008  . HEADACHE, CHRONIC 09/06/2008  . FIBROIDS, UTERUS 09/01/2008  . HYPOTHYROIDISM 09/01/2008  . GERD 09/01/2008   Past Medical History:    Diagnosis Date  . Anemia 2007   due to fibroids  . Duodenitis without hemorrhage   . Esophageal stricture   . Esophagitis   . GERD (gastroesophageal reflux disease)   . Hiatal hernia   . Hyperlipidemia   . Hypertension   . Hypothyroidism   . Leiomyoma of uterus, unspecified    fibroids  . Pneumonia    aspiration pneumonia after balloon surgery for weight loss    Family History  Problem Relation Age of Onset  . Celiac disease Mother   . Osteoporosis Mother   . Heart disease Father   . Stroke Father   . Irritable bowel syndrome Sister   . Lung cancer Maternal Grandmother   . Breast cancer Paternal Grandmother   . Heart disease Sister   . Melanoma Sister   . Colon cancer Neg Hx   . Esophageal cancer Neg Hx   . Stomach cancer Neg Hx   . Rectal cancer Neg Hx     Past Surgical History:  Procedure Laterality Date  . BICEPT TENODESIS Right 12/07/2019   Procedure: BICEPS TENODESIS;  Surgeon: Meredith Pel, MD;  Location: [place];  Service: Orthopedics;  Laterality: Right;  . CARPAL TUNNEL RELEASE  2014   right wrist  . CHOLECYSTECTOMY    . HYSTEROSCOPY W/ ENDOMETRIAL ABLATION  4/05  . RECTOCELE REPAIR  2007  . SHOULDER  ARTHROSCOPY WITH OPEN ROTATOR CUFF REPAIR Right 12/07/2019   Procedure: RIGHT SHOULDER ARTHROSCOPY,   MINI OPEN SUBSCAPULARIS, AND SUPRASPINATUS REPAIR, OPEN BICEPS TENODESIS;  Surgeon: Meredith Pel, MD;  Location: [place];  Service: Orthopedics;  Laterality: Right;  . TONSILLECTOMY     as child  . TOTAL VAGINAL HYSTERECTOMY  5/07  . TUBAL LIGATION  1991   BILATERAL   Social History   Occupational History  . Occupation: Nurse  Tobacco Use  . Smoking status: Former Smoker    Quit date: 12/23/2002    Years since quitting: 16.9  . Smokeless tobacco: Never Used  Substance and Sexual Activity  . Alcohol use: Yes    Comment: rare  . Drug use: No  . Sexual activity: Yes    Partners: Male    Birth  control/protection: Surgical    Comment: [facility]

## 2019-12-16 ENCOUNTER — Telehealth: Payer: Self-pay | Admitting: Orthopedic Surgery

## 2019-12-16 MED FILL — oxyCODONE HCL 5 MG TABS: 5 | 10 days supply | Qty: 30 | Fill #0

## 2019-12-16 MED FILL — METHOCARBAMOL 500 MG TABS: 500 | 10 days supply | Qty: 30 | Fill #0

## 2019-12-16 NOTE — Telephone Encounter (Signed)
Emailed release form to patient

## 2019-12-29 ENCOUNTER — Other Ambulatory Visit: Payer: Self-pay

## 2019-12-29 ENCOUNTER — Ambulatory Visit (INDEPENDENT_AMBULATORY_CARE_PROVIDER_SITE_OTHER): Payer: 59 | Admitting: Orthopedic Surgery

## 2019-12-29 DIAGNOSIS — S46011D Strain of muscle(s) and tendon(s) of the rotator cuff of right shoulder, subsequent encounter: Secondary | ICD-10-CM

## 2019-12-31 ENCOUNTER — Encounter: Payer: Self-pay | Admitting: Orthopedic Surgery

## 2019-12-31 NOTE — Progress Notes (Signed)
Post-Op Visit Note   Patient: [name]           Date of Birth: 19-Jan-1961           MRN: [id number] Visit Date: 12/29/2019 PCP: Crist Infante, MD   Assessment & Plan:  Chief Complaint:  Chief Complaint  Patient presents with  . Right Shoulder - Pain   Visit Diagnoses:  1. Traumatic complete tear of right rotator cuff, subsequent encounter     Plan: Patient is a 59 year old female who presents s/p right shoulder arthroscopy with rotator cuff repair and open biceps tenodesis.  She notes that she is improving slowly and able to do more day-to-day.  She is taking oxycodone about 1-2 times a day and taking Robaxin as well.  She is up to 75 degrees on CPM machine.  She has 65 to 70 degrees of abduction as well as 90 degrees of forward flexion and 25 degrees of external rotation.  Her incisions is healing well.  Plan to start outpatient physical therapy for the right shoulder 2 times per week for 4 weeks and then transition home exercise program.  She may begin strengthening in 2 weeks.  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  Start with 20 hours/week for 2 weeks and then work up from there.  Patient agrees with this plan.  She will follow-up in 4 weeks for clinical recheck.  Follow-Up Instructions: No follow-ups on file.   Orders:  Orders Placed This Encounter  Procedures  . Ambulatory referral to Physical Therapy   No orders of the defined types were placed in this encounter.   Imaging: No results found.  PMFS History: Patient Active Problem List   Diagnosis Date Noted  . Morbid obesity ([place]) 09/16/2012  . HYPERLIPIDEMIA 09/06/2008  . UTI 09/06/2008  . FIBROMYALGIA 09/06/2008  . HEADACHE, CHRONIC 09/06/2008  . FIBROIDS, UTERUS 09/01/2008  . HYPOTHYROIDISM 09/01/2008  . GERD 09/01/2008   Past Medical History:  Diagnosis Date  . Anemia 2007   due to fibroids  . Duodenitis without hemorrhage   . Esophageal  stricture   . Esophagitis   . GERD (gastroesophageal reflux disease)   . Hiatal hernia   . Hyperlipidemia   . Hypertension   . Hypothyroidism   . Leiomyoma of uterus, unspecified    fibroids  . Pneumonia    aspiration pneumonia after balloon surgery for weight loss    Family History  Problem Relation Age of Onset  . Celiac disease Mother   . Osteoporosis Mother   . Heart disease Father   . Stroke Father   . Irritable bowel syndrome Sister   . Lung cancer Maternal Grandmother   . Breast cancer Paternal Grandmother   . Heart disease Sister   . Melanoma Sister   . Colon cancer Neg Hx   . Esophageal cancer Neg Hx   . Stomach cancer Neg Hx   . Rectal cancer Neg Hx     Past Surgical History:  Procedure Laterality Date  . BICEPT TENODESIS Right 12/07/2019   Procedure: BICEPS TENODESIS;  Surgeon: Meredith Pel, MD;  Location: [place];  Service: Orthopedics;  Laterality: Right;  . CARPAL TUNNEL RELEASE  2014   right wrist  . CHOLECYSTECTOMY    . HYSTEROSCOPY W/ ENDOMETRIAL ABLATION  4/05  . RECTOCELE REPAIR  2007  . SHOULDER ARTHROSCOPY WITH OPEN ROTATOR CUFF REPAIR Right 12/07/2019  Procedure: RIGHT SHOULDER ARTHROSCOPY,   MINI OPEN SUBSCAPULARIS, AND SUPRASPINATUS REPAIR, OPEN BICEPS TENODESIS;  Surgeon: Meredith Pel, MD;  Location: [place];  Service: Orthopedics;  Laterality: Right;  . TONSILLECTOMY     as child  . TOTAL VAGINAL HYSTERECTOMY  5/07  . TUBAL LIGATION  1991   BILATERAL   Social History   Occupational History  . Occupation: Nurse  Tobacco Use  . Smoking status: Former Smoker    Quit date: 12/23/2002    Years since quitting: 17.0  . Smokeless tobacco: Never Used  Substance and Sexual Activity  . Alcohol use: Yes    Comment: rare  . Drug use: No  . Sexual activity: Yes    Partners: Male    Birth control/protection: Surgical    Comment: [facility]

## 2020-01-03 ENCOUNTER — Encounter: Payer: Self-pay | Admitting: Rehabilitative and Restorative Service Providers"

## 2020-01-03 ENCOUNTER — Other Ambulatory Visit: Payer: Self-pay

## 2020-01-03 ENCOUNTER — Ambulatory Visit: Payer: 59 | Admitting: Rehabilitative and Restorative Service Providers"

## 2020-01-03 DIAGNOSIS — M25511 Pain in right shoulder: Secondary | ICD-10-CM

## 2020-01-03 DIAGNOSIS — M6281 Muscle weakness (generalized): Secondary | ICD-10-CM

## 2020-01-03 MED ORDER — OXYCODONE HCL 5 MG PO TABS: 5.0000 mg | ORAL_TABLET | Freq: Two times a day (BID) | ORAL | 0 refills | Status: DC | PRN

## 2020-01-03 MED FILL — ROSUVASTATIN CALCIUM 10 MG: 10 | 90 days supply | Qty: 90 | Fill #0

## 2020-01-03 MED FILL — oxyCODONE HCL 5 MG TABS: 5 | 15 days supply | Qty: 30 | Fill #0

## 2020-01-03 NOTE — Therapy (Signed)
[facility] Physical Therapy [address] [place], [place], [address] Phone: [phone]   Fax:  [phone]  Physical Therapy Evaluation  Patient Details  Name: [name] MRN: [id number] Date of Birth: [birth year] Referring Provider (PT): Dr. Marlou Sa   Encounter Date: 01/03/2020  PT End of Session - 01/03/20 1625    Visit Number  1    Number of Visits  16    Date for PT Re-Evaluation  02/28/20    PT Start Time  1540    PT Stop Time  1620    PT Time Calculation (min)  40 min    Activity Tolerance  Patient tolerated treatment well    Behavior During Therapy  [facility] for tasks assessed/performed       Past Medical History:  Diagnosis Date  . Anemia 2007   due to fibroids  . Duodenitis without hemorrhage   . Esophageal stricture   . Esophagitis   . GERD (gastroesophageal reflux disease)   . Hiatal hernia   . Hyperlipidemia   . Hypertension   . Hypothyroidism   . Leiomyoma of uterus, unspecified    fibroids  . Pneumonia    aspiration pneumonia after balloon surgery for weight loss    Past Surgical History:  Procedure Laterality Date  . BICEPT TENODESIS Right 12/07/2019   Procedure: BICEPS TENODESIS;  Surgeon: Meredith Pel, MD;  Location: [place];  Service: Orthopedics;  Laterality: Right;  . CARPAL TUNNEL RELEASE  2014   right wrist  . CHOLECYSTECTOMY    . HYSTEROSCOPY W/ ENDOMETRIAL ABLATION  4/05  . RECTOCELE REPAIR  2007  . SHOULDER ARTHROSCOPY WITH OPEN ROTATOR CUFF REPAIR Right 12/07/2019   Procedure: RIGHT SHOULDER ARTHROSCOPY,   MINI OPEN SUBSCAPULARIS, AND SUPRASPINATUS REPAIR, OPEN BICEPS TENODESIS;  Surgeon: Meredith Pel, MD;  Location: [place];  Service: Orthopedics;  Laterality: Right;  . TONSILLECTOMY     as child  . TOTAL VAGINAL HYSTERECTOMY  5/07  . TUBAL LIGATION  1991   BILATERAL    There were no vitals filed for this visit.   Subjective Assessment - 01/03/20 1534     Subjective  Pt. comes to clinic s/p recent surgery on 12/07/2019 - Rt shoulder mini open subscapularis and supraspinatus repair, open biceps tendodesis.  Pt. indicated some R shoulder pain related to previous falls but unsure of which caused trouble.   Pt. stated wearing sling at night now (previously daily) but not during the day (started about 1 week ago).    Limitations  --   Reaching, lifting, carrying   Patient Stated Goals  Return to work and activity.    Currently in Pain?  Yes    Pain Location  Shoulder    Pain Orientation  Right    Pain Descriptors / Indicators  Sharp;Shooting    Pain Type  Acute pain;Surgical pain    Pain Onset  More than a month ago    Pain Frequency  Intermittent    Aggravating Factors   Reaching, lifting, carrying, work activity    Pain Relieving Factors  none listed    Effect of Pain on Daily Activities  Unable to work, limited in [employer], self care.         [facility] PT Assessment - 01/03/20 0001      Assessment   Medical Diagnosis  s/p Rt shoulder mini open subscapularis and supraspinatus repair, open biceps tendodesis    Referring Provider (PT)  Dr. Marlou Sa  Onset Date/Surgical Date  12/07/19    Hand Dominance  Right    Prior Therapy  no      Precautions   Precautions  Shoulder    Type of Shoulder Precautions  Manual PT x 2 weeks stretching, ER/FF/ABD then ok to transition to strengthening 2x/wk for 4 weeks and HEP      Balance Screen   Has the patient fallen in the past 6 months  No    Has the patient had a decrease in activity level because of a fear of falling?   No    Is the patient reluctant to leave their home because of a fear of falling?   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      PROM   Overall PROM Comments  R GH joint mobility WFL in mid range at this time.  PROM limited primarily due to pain symptoms.     PROM Assessment Site  Shoulder    Right/Left Shoulder  Right;Left    Right Shoulder Flexion  105 Degrees    Right Shoulder ABduction  90 Degrees    Right Shoulder External Rotation  40 Degrees   in 45 degree abd     Strength   Strength Assessment Site  Shoulder    Right/Left Shoulder  Left;Right    Left Shoulder Flexion  5/5    Left Shoulder ABduction  5/5    Left Shoulder Internal Rotation  5/5    Left Shoulder External Rotation  5/5      Palpation   Palpation comment  Mild tenderness anterior R gh joint                Objective measurements completed on examination: See above findings.      [facility] Adult PT Treatment/Exercise - 01/03/20 0001      Exercises   Exercises  --      Shoulder Exercises: Stretch   Table Stretch - Abduction  Other (comment)   R table slides 3 x 10   Other Shoulder Stretches  Supine PROM R GH flexion 3 x 10, supine ER c wand 3 x 10      Manual Therapy   Manual Therapy  Joint mobilization    Manual therapy comments  g2-g3 inferior joint mobilizations, PROM             PT Education - 01/03/20 1534    Education Details  HEP, clinical evaluation education, explanation    Person(s) Educated  Patient    Methods  Explanation;Demonstration;Handout    Comprehension  Verbalized understanding;Returned demonstration       PT Short Term Goals - 01/03/20  45      PT SHORT TERM GOAL #1   Title  Patient will demonstrate independent use of  home exercise program to maintain progress from in clinic treatments.    Time  2    Period  Weeks    Status  New    Target Date  01/17/20        PT Long Term Goals - 01/03/20 1627      PT LONG TERM GOAL #1   Title  Patient will demonstrate/report pain at worst less than or equal to 2/10 to facilitate minimal limitation in daily activity secondary to pain symptoms.    Time  8    Period  Weeks    Status  New    Target Date  02/28/20      PT LONG TERM GOAL #2   Title  Patient will demonstrate independent use of home exercise program to facilitate ability to maintain/progress functional gains from skilled physical therapy services.    Time  8    Period  Weeks    Status  New    Target Date  02/28/20      PT LONG TERM GOAL #3   Title  Patient will demonstrate return to work/recreational activity at previous level of function without limitations secondary due to condition    Time  8    Period  Weeks    Status  New    Target Date  02/28/20      PT LONG TERM GOAL #4   Title  Pt. will demonstrate R GH joint AROM WFL s symptoms to facilitate usual dressing, self care, reaching at PLOF.    Time  8    Period  Weeks    Status  New    Target Date  02/28/20      PT LONG TERM GOAL #5   Title  Pt. will demonstrate RUE MMT 5/5 throughout to facilitate usual lifting, carrying in household and work activity at [employer].    Time  8    Period  Weeks    Status  New    Target Date  02/28/20             Plan - 01/03/20 1629    Clinical Impression Statement  Patient is a 59 y.o. female who comes to clinic with complaints of Rt shoulder  pain with mobility, strength and movement coordination deficits that impair their ability to perform usual daily and recreational functional activities without increase difficulty/symptoms at this time.  Patient to benefit from skilled PT services to address impairments and limitations to improve to previous level of function without restriction secondary to  condition.    Examination-Activity Limitations  Bathing;Carry;Lift;Hygiene/Grooming;Sleep;Reach Overhead    Examination-Participation Restrictions  Youth worker;Other    Stability/Clinical Decision Making  Stable/Uncomplicated    Clinical Decision Making  Low    Rehab Potential  Good    PT Frequency  2x / week    PT Duration  8 weeks    PT Treatment/Interventions  ADLs/Self Care Home Management;Electrical Stimulation;Traction;Moist Heat;Iontophoresis 4mg /ml Dexamethasone;Therapeutic activities;Therapeutic exercise;Neuromuscular re-education;Manual techniques;Patient/family education;Passive range of motion;Dry needling;Joint Manipulations;Spinal Manipulations;Taping;Vasopneumatic Device    PT Next Visit Plan  Review HEP, progress passive mobility as tolerated.    PT Home Exercise Plan  [email or web address]    Consulted and Agree with Plan of Care  Patient       Patient will benefit from skilled therapeutic intervention in order to improve the following deficits and impairments:  Decreased  range of motion, Impaired UE functional use, Pain, Decreased mobility, Decreased strength  Visit Diagnosis: Acute pain of right shoulder  Muscle weakness (generalized)     Problem List Patient Active Problem List   Diagnosis Date Noted  . Morbid obesity ([place]) 09/16/2012  . HYPERLIPIDEMIA 09/06/2008  . UTI 09/06/2008  . FIBROMYALGIA 09/06/2008  . HEADACHE, CHRONIC 09/06/2008  . FIBROIDS, UTERUS 09/01/2008  . HYPOTHYROIDISM 09/01/2008  . GERD 09/01/2008    Scot Jun, PT, DPT, OCS, ATC 01/03/20  4:33 PM    [facility] [address] [place], [place], [address] Phone: [phone]   Fax:  [phone]  Name: [name] MRN: [id number] Date of Birth: [birth year]

## 2020-01-03 NOTE — Patient Instructions (Signed)
Access Code: [email or web address] URL: [email or web address] Date: 01/03/2020 Prepared by: Scot Jun  Exercises Supine Shoulder Flexion AAROM with Hands Clasped - 10 reps - 3 sets - 5 hold - 2x daily - 7x weekly Supine Shoulder External Rotation with Dowel - 10 reps - 3 sets - 5 hold - 2x daily - 7x weekly Seated Scapular Retraction - 10 reps - 2 sets - 5 hold - 2x daily - 7x weekly Seated Shoulder Abduction Towel Slide at Table Top - 10 reps - 2-3 sets - 5 hold - 2x daily - 7x weekly

## 2020-01-03 NOTE — Telephone Encounter (Signed)
Patient notified

## 2020-01-09 ENCOUNTER — Encounter: Payer: Self-pay | Admitting: Orthopedic Surgery

## 2020-01-10 MED FILL — DULoxetine HCL 30 MG CPEP: 30 | 90 days supply | Qty: 90 | Fill #2

## 2020-01-10 MED FILL — LOSARTAN POTASSIUM 100 MG T: 100 | 90 days supply | Qty: 90 | Fill #2

## 2020-01-11 ENCOUNTER — Other Ambulatory Visit: Payer: Self-pay

## 2020-01-11 ENCOUNTER — Encounter: Payer: Self-pay | Admitting: Rehabilitative and Restorative Service Providers"

## 2020-01-11 ENCOUNTER — Ambulatory Visit (INDEPENDENT_AMBULATORY_CARE_PROVIDER_SITE_OTHER): Payer: 59 | Admitting: Rehabilitative and Restorative Service Providers"

## 2020-01-11 DIAGNOSIS — M6281 Muscle weakness (generalized): Secondary | ICD-10-CM

## 2020-01-11 DIAGNOSIS — M25511 Pain in right shoulder: Secondary | ICD-10-CM

## 2020-01-11 NOTE — Therapy (Signed)
[facility] [address] [place], [place], [address] Phone: [phone]   Fax:  [phone]  Physical Therapy Treatment  Patient Details  Name: [name] MRN: [id number] Date of Birth: 1961-10-31 Referring Provider (PT): Dr. Marlou Sa   Encounter Date: 01/11/2020  PT End of Session - 01/11/20 0834    Visit Number  2    Number of Visits  16    Date for PT Re-Evaluation  02/28/20    PT Start Time  0800    PT Stop Time  0839    PT Time Calculation (min)  39 min    Activity Tolerance  Patient tolerated treatment well    Behavior During Therapy  [facility] for tasks assessed/performed       Past Medical History:  Diagnosis Date  . Anemia 2007   due to fibroids  . Duodenitis without hemorrhage   . Esophageal stricture   . Esophagitis   . GERD (gastroesophageal reflux disease)   . Hiatal hernia   . Hyperlipidemia   . Hypertension   . Hypothyroidism   . Leiomyoma of uterus, unspecified    fibroids  . Pneumonia    aspiration pneumonia after balloon surgery for weight loss    Past Surgical History:  Procedure Laterality Date  . BICEPT TENODESIS Right 12/07/2019   Procedure: BICEPS TENODESIS;  Surgeon: Meredith Pel, MD;  Location: [place];  Service: Orthopedics;  Laterality: Right;  . CARPAL TUNNEL RELEASE  2014   right wrist  . CHOLECYSTECTOMY    . HYSTEROSCOPY W/ ENDOMETRIAL ABLATION  4/05  . RECTOCELE REPAIR  2007  . SHOULDER ARTHROSCOPY WITH OPEN ROTATOR CUFF REPAIR Right 12/07/2019   Procedure: RIGHT SHOULDER ARTHROSCOPY,   MINI OPEN SUBSCAPULARIS, AND SUPRASPINATUS REPAIR, OPEN BICEPS TENODESIS;  Surgeon: Meredith Pel, MD;  Location: [place];  Service: Orthopedics;  Laterality: Right;  . TONSILLECTOMY     as child  . TOTAL VAGINAL HYSTERECTOMY  5/07  . TUBAL LIGATION  1991   BILATERAL    There were no vitals filed for this visit.  Subjective Assessment - 01/11/20 0804    Subjective   Pt. stated feeling some pain complaints c using mouse at computer and also with lowering arm in passive mobility.    Limitations  --   Reaching, lifting, carrying   Patient Stated Goals  Return to work and activity.    Currently in Pain?  Yes    Pain Score  4     Pain Location  Shoulder    Pain Orientation  Right    Pain Descriptors / Indicators  Shooting    Pain Onset  More than a month ago    Pain Frequency  Intermittent                       [facility] Adult PT Treatment/Exercise - 01/11/20 0001      Shoulder Exercises: Standing   Row  Strengthening;Both;Other (comment)   3 x 10   Theraband Level (Shoulder Row)  Level 3 (Green)      Shoulder Exercises: Stretch   Table Stretch - Flexion  Other (comment)   2x 10    Table Stretch - Abduction  Other (comment)    Other Shoulder Stretches  Rt upper trap stretch 15 sec x 5 in sitting    Other Shoulder Stretches  Supine PROM R GH flexion 3 x 10, supine ER c wand 3 x 10  Manual Therapy   Manual Therapy  Joint mobilization    Manual therapy comments  g2-g3 inferior joint mobilizations, PROM             PT Education - 01/11/20 0823    Education Details  Review of HEP, cues for relaxation during passive movement    Person(s) Educated  Patient    Methods  Explanation;Verbal cues    Comprehension  Verbalized understanding;Returned demonstration       PT Short Term Goals - 01/03/20 1534      PT SHORT TERM GOAL #1   Title  Patient will demonstrate independent use of home exercise program to maintain progress from in clinic treatments.    Time  2    Period  Weeks    Status  New    Target Date  01/17/20        PT Long Term Goals - 01/03/20 1627      PT LONG TERM GOAL #1   Title  Patient will demonstrate/report pain at worst less than or equal to 2/10 to facilitate minimal limitation in daily activity secondary to pain symptoms.    Time  8    Period  Weeks    Status  New    Target Date  02/28/20       PT LONG TERM GOAL #2   Title  Patient will demonstrate independent use of home exercise program to facilitate ability to maintain/progress functional gains from skilled physical therapy services.    Time  8    Period  Weeks    Status  New    Target Date  02/28/20      PT LONG TERM GOAL #3   Title  Patient will demonstrate return to work/recreational activity at previous level of function without limitations secondary due to condition    Time  8    Period  Weeks    Status  New    Target Date  02/28/20      PT LONG TERM GOAL #4   Title  Pt. will demonstrate R GH joint AROM WFL s symptoms to facilitate usual dressing, self care, reaching at PLOF.    Time  8    Period  Weeks    Status  New    Target Date  02/28/20      PT LONG TERM GOAL #5   Title  Pt. will demonstrate RUE MMT 5/5 throughout to facilitate usual lifting, carrying in household and work activity at [employer].    Time  8    Period  Weeks    Status  New    Target Date  02/28/20            Plan - 01/11/20 0827    Clinical Impression Statement  Pt. presents c muscular guarding in passive ROM in HEP at this time which creates mild to moderate painful arc in movement.  Improved c cues and adjustment in HEP techniques.  Skilled PT services indicated at this time to continue.    Examination-Activity Limitations  Bathing;Carry;Lift;Hygiene/Grooming;Sleep;Reach Overhead    Examination-Participation Restrictions  Youth worker;Other    Stability/Clinical Decision Making  Stable/Uncomplicated    Rehab Potential  Good    PT Frequency  2x / week    PT Duration  8 weeks    PT Treatment/Interventions  ADLs/Self Care Home Management;Electrical Stimulation;Traction;Moist Heat;Iontophoresis 4mg /ml Dexamethasone;Therapeutic activities;Therapeutic exercise;Neuromuscular re-education;Manual techniques;Patient/family education;Passive range of motion;Dry needling;Joint Manipulations;Spinal  Manipulations;Taping;Vasopneumatic Device    PT Next Visit Plan  Initiate isometric strengthening next visit    PT Home Exercise Plan  [email or web address]    Consulted and Agree with Plan of Care  Patient       Patient will benefit from skilled therapeutic intervention in order to improve the following deficits and impairments:  Decreased range of motion, Impaired UE functional use, Pain, Decreased mobility, Decreased strength  Visit Diagnosis: Acute pain of right shoulder  Muscle weakness (generalized)     Problem List Patient Active Problem List   Diagnosis Date Noted  . Morbid obesity ([place]) 09/16/2012  . HYPERLIPIDEMIA 09/06/2008  . UTI 09/06/2008  . FIBROMYALGIA 09/06/2008  . HEADACHE, CHRONIC 09/06/2008  . FIBROIDS, UTERUS 09/01/2008  . HYPOTHYROIDISM 09/01/2008  . GERD 09/01/2008    Scot Jun, PT, DPT, OCS, ATC 01/11/20  8:40 AM     [facility] [address] [place], [place], [address] Phone: [phone]   Fax:  [phone]  Name: [name] MRN: [id number] Date of Birth: 1961/11/07

## 2020-01-12 ENCOUNTER — Encounter: Payer: Self-pay | Admitting: Orthopedic Surgery

## 2020-01-12 NOTE — Telephone Encounter (Signed)
No lifting or overhead work with affected arm for the next 6 weeks.  Okay to do work with the unaffected arm without limitation.

## 2020-01-13 ENCOUNTER — Encounter: Payer: 59 | Admitting: Rehabilitative and Restorative Service Providers"

## 2020-01-17 MED FILL — CHLORTHALIDONE 25 MG TABS: 25 | 90 days supply | Qty: 90 | Fill #3

## 2020-01-18 ENCOUNTER — Other Ambulatory Visit: Payer: Self-pay

## 2020-01-18 ENCOUNTER — Ambulatory Visit: Payer: 59 | Admitting: Rehabilitative and Restorative Service Providers"

## 2020-01-18 ENCOUNTER — Encounter: Payer: Self-pay | Admitting: Rehabilitative and Restorative Service Providers"

## 2020-01-18 DIAGNOSIS — M25511 Pain in right shoulder: Secondary | ICD-10-CM | POA: Diagnosis not present

## 2020-01-18 DIAGNOSIS — M6281 Muscle weakness (generalized): Secondary | ICD-10-CM

## 2020-01-18 NOTE — Patient Instructions (Signed)
Access Code: [email or web address] URL: [email or web address] Date: 01/18/2020 Prepared by: Scot Jun  Exercises Supine Shoulder External Rotation with Dowel - 10 reps - 3 sets - 5 hold - 2x daily - 7x weekly Seated Shoulder Abduction Towel Slide at Table Top - 10 reps - 2-3 sets - 5 hold - 2x daily - 7x weekly Supine Shoulder Flexion with Dowel - 10 reps - 3 sets - 2x daily - 7x weekly Standing Shoulder Row with Anchored Resistance - 10 reps - 3 sets - 1x daily - 7x weekly Shoulder Extension with Resistance - 10 reps - 3 sets - 1x daily - 7x weekly Isometric Shoulder Flexion at Wall - 10 reps - 1 sets - 5 hold - 1x daily - 7x weekly Isometric Shoulder Abduction at Wall - 10 reps - 1 sets - 5 hold - 1x daily - 7x weekly

## 2020-01-18 NOTE — Therapy (Signed)
[facility] Physical Therapy [address] [place], [place], [address] Phone: [phone]   Fax:  [phone]  Physical Therapy Treatment  Patient Details  Name: [name] MRN: [id number] Date of Birth: [birth year] Referring Provider (PT): Dr. Marlou Sa   Encounter Date: 01/18/2020  PT End of Session - 01/18/20 0834    Visit Number  3    Number of Visits  16    Date for PT Re-Evaluation  02/28/20    PT Start Time  0807    PT Stop Time  0846    PT Time Calculation (min)  39 min    Activity Tolerance  Patient tolerated treatment well    Behavior During Therapy  [facility] for tasks assessed/performed       Past Medical History:  Diagnosis Date  . Anemia 2007   due to fibroids  . Duodenitis without hemorrhage   . Esophageal stricture   . Esophagitis   . GERD (gastroesophageal reflux disease)   . Hiatal hernia   . Hyperlipidemia   . Hypertension   . Hypothyroidism   . Leiomyoma of uterus, unspecified    fibroids  . Pneumonia    aspiration pneumonia after balloon surgery for weight loss    Past Surgical History:  Procedure Laterality Date  . BICEPT TENODESIS Right 12/07/2019   Procedure: BICEPS TENODESIS;  Surgeon: Meredith Pel, MD;  Location: [place];  Service: Orthopedics;  Laterality: Right;  . CARPAL TUNNEL RELEASE  2014   right wrist  . CHOLECYSTECTOMY    . HYSTEROSCOPY W/ ENDOMETRIAL ABLATION  4/05  . RECTOCELE REPAIR  2007  . SHOULDER ARTHROSCOPY WITH OPEN ROTATOR CUFF REPAIR Right 12/07/2019   Procedure: RIGHT SHOULDER ARTHROSCOPY,   MINI OPEN SUBSCAPULARIS, AND SUPRASPINATUS REPAIR, OPEN BICEPS TENODESIS;  Surgeon: Meredith Pel, MD;  Location: [place];  Service: Orthopedics;  Laterality: Right;  . TONSILLECTOMY     as child  . TOTAL VAGINAL HYSTERECTOMY  5/07  . TUBAL LIGATION  1991   BILATERAL    There were no vitals filed for this visit.  Subjective Assessment - 01/18/20 0827    Subjective   Pt. reported feeling some tightness in neck continued.  Mild to moderate shoulder pain at times.    Limitations  --   Reaching, lifting, carrying   Patient Stated Goals  Return to work and activity.    Pain Score  3     Pain Location  Shoulder    Pain Orientation  Right    Pain Descriptors / Indicators  Shooting    Pain Type  Acute pain;Surgical pain    Pain Onset  More than a month ago                       [facility] Adult PT Treatment/Exercise - 01/18/20 0001      Shoulder Exercises: Supine   Flexion  AROM;Right   x 10 AROM   Other Supine Exercises  supine 90 degree flexion circles clockwise, counterclockwise 30 x 2 each way 1 lb      Shoulder Exercises: Standing   Row  Strengthening;Both;Other (comment)    Theraband Level (Shoulder Row)  Level 3 (Green)    Other Standing Exercises  isometric flexion, abd, er 5 second hold Rt UE      Shoulder Exercises: ROM/Strengthening   UBE (Upper Arm Bike)  L1 3 mins forward/reverse each      Shoulder Exercises: Stretch   Other  Shoulder Stretches  supine wand flexion 3 x 10,       Manual Therapy   Manual Therapy  Joint mobilization    Manual therapy comments  g2-g3 inferior joint mobilizations, PROM             PT Education - 01/18/20 [id number]    Education Details  HEP, techiques for new intervention.    Person(s) Educated  Patient    Methods  Explanation;Verbal cues;Handout    Comprehension  Verbalized understanding       PT Short Term Goals - 01/03/20 1534      PT SHORT TERM GOAL #1   Title  Patient will demonstrate independent use of home exercise program to maintain progress from in clinic treatments.    Time  2    Period  Weeks    Status  New    Target Date  01/17/20        PT Long Term Goals - 01/03/20 1627      PT LONG TERM GOAL #1   Title  Patient will demonstrate/report pain at worst less than or equal to 2/10 to facilitate minimal limitation in daily activity secondary to pain symptoms.     Time  8    Period  Weeks    Status  New    Target Date  02/28/20      PT LONG TERM GOAL #2   Title  Patient will demonstrate independent use of home exercise program to facilitate ability to maintain/progress functional gains from skilled physical therapy services.    Time  8    Period  Weeks    Status  New    Target Date  02/28/20      PT LONG TERM GOAL #3   Title  Patient will demonstrate return to work/recreational activity at previous level of function without limitations secondary due to condition    Time  8    Period  Weeks    Status  New    Target Date  02/28/20      PT LONG TERM GOAL #4   Title  Pt. will demonstrate R GH joint AROM WFL s symptoms to facilitate usual dressing, self care, reaching at PLOF.    Time  8    Period  Weeks    Status  New    Target Date  02/28/20      PT LONG TERM GOAL #5   Title  Pt. will demonstrate RUE MMT 5/5 throughout to facilitate usual lifting, carrying in household and work activity at [employer].    Time  8    Period  Weeks    Status  New    Target Date  02/28/20            Plan - 01/18/20 0831    Clinical Impression Statement  Presentation today showed mobility restrictions primarily due to muscle guarding and pain reports, minimal joint restriction noted in available range.  Painful arc in supine active flexion, withheld from HEP at this time until symptoms improve.    Examination-Activity Limitations  Bathing;Carry;Lift;Hygiene/Grooming;Sleep;Reach Overhead    Examination-Participation Restrictions  Youth worker;Other    Stability/Clinical Decision Making  Stable/Uncomplicated    Rehab Potential  Good    PT Frequency  2x / week    PT Duration  8 weeks    PT Treatment/Interventions  ADLs/Self Care Home Management;Electrical Stimulation;Traction;Moist Heat;Iontophoresis 4mg /ml Dexamethasone;Therapeutic activities;Therapeutic exercise;Neuromuscular re-education;Manual  techniques;Patient/family education;Passive range of motion;Dry needling;Joint Manipulations;Spinal Manipulations;Taping;Vasopneumatic Device  PT Next Visit Plan  Continue to progress AAROM, AROM.    PT Home Exercise Plan  [id number]    Consulted and Agree with Plan of Care  Patient       Patient will benefit from skilled therapeutic intervention in order to improve the following deficits and impairments:  Decreased range of motion, Impaired UE functional use, Pain, Decreased mobility, Decreased strength  Visit Diagnosis: Acute pain of right shoulder  Muscle weakness (generalized)     Problem List Patient Active Problem List   Diagnosis Date Noted  . Morbid obesity ([place]) 09/16/2012  . HYPERLIPIDEMIA 09/06/2008  . UTI 09/06/2008  . FIBROMYALGIA 09/06/2008  . HEADACHE, CHRONIC 09/06/2008  . FIBROIDS, UTERUS 09/01/2008  . HYPOTHYROIDISM 09/01/2008  . GERD 09/01/2008    Scot Jun, PT, DPT, OCS, ATC 01/18/20  8:46 AM    [facility] Physical Therapy [address] [place], [place], [address] Phone: [phone]   Fax:  [phone]  Name: SANDI MIELES MRN: [id number] Date of Birth: 1961/01/24

## 2020-01-20 ENCOUNTER — Ambulatory Visit: Payer: 59 | Admitting: Rehabilitative and Restorative Service Providers"

## 2020-01-20 ENCOUNTER — Other Ambulatory Visit: Payer: Self-pay

## 2020-01-20 ENCOUNTER — Encounter: Payer: Self-pay | Admitting: Rehabilitative and Restorative Service Providers"

## 2020-01-20 DIAGNOSIS — M25511 Pain in right shoulder: Secondary | ICD-10-CM

## 2020-01-20 DIAGNOSIS — M6281 Muscle weakness (generalized): Secondary | ICD-10-CM

## 2020-01-20 NOTE — Therapy (Signed)
[facility] Physical Therapy [address] [place], [place], [address] Phone: [phone]   Fax:  [phone]  Physical Therapy Treatment  Patient Details  Name: [name] MRN: [id number] Date of Birth: 06-22-1961 Referring Provider (PT): Dr. Marlou Sa   Encounter Date: 01/20/2020  PT End of Session - 01/20/20 0831    Visit Number  4    Number of Visits  16    Date for PT Re-Evaluation  02/28/20    PT Start Time  0803    PT Stop Time  [id number]    PT Time Calculation (min)  52 min    Activity Tolerance  Patient tolerated treatment well    Behavior During Therapy  [facility] for tasks assessed/performed       Past Medical History:  Diagnosis Date  . Anemia 2007   due to fibroids  . Duodenitis without hemorrhage   . Esophageal stricture   . Esophagitis   . GERD (gastroesophageal reflux disease)   . Hiatal hernia   . Hyperlipidemia   . Hypertension   . Hypothyroidism   . Leiomyoma of uterus, unspecified    fibroids  . Pneumonia    aspiration pneumonia after balloon surgery for weight loss    Past Surgical History:  Procedure Laterality Date  . BICEPT TENODESIS Right 12/07/2019   Procedure: BICEPS TENODESIS;  Surgeon: Meredith Pel, MD;  Location: [place];  Service: Orthopedics;  Laterality: Right;  . CARPAL TUNNEL RELEASE  2014   right wrist  . CHOLECYSTECTOMY    . HYSTEROSCOPY W/ ENDOMETRIAL ABLATION  4/05  . RECTOCELE REPAIR  2007  . SHOULDER ARTHROSCOPY WITH OPEN ROTATOR CUFF REPAIR Right 12/07/2019   Procedure: RIGHT SHOULDER ARTHROSCOPY,   MINI OPEN SUBSCAPULARIS, AND SUPRASPINATUS REPAIR, OPEN BICEPS TENODESIS;  Surgeon: Meredith Pel, MD;  Location: [place];  Service: Orthopedics;  Laterality: Right;  . TONSILLECTOMY     as child  . TOTAL VAGINAL HYSTERECTOMY  5/07  . TUBAL LIGATION  1991   BILATERAL    There were no vitals filed for this visit.  Subjective Assessment - 01/20/20 0816    Subjective   Pt. indicated feeling some spasm/shooting pain in front of shoulder and upper arm at times last night and some this morning.  made sleeping trouble  Didn't feel it in HEP at home.    Limitations  --   Reaching, lifting, carrying   Patient Stated Goals  Return to work and activity.    Currently in Pain?  Yes    Pain Score  5     Pain Location  Shoulder    Pain Orientation  Right    Pain Descriptors / Indicators  Shooting    Pain Type  Acute pain;Surgical pain    Pain Onset  More than a month ago                       [facility] Adult PT Treatment/Exercise - 01/20/20 0001      Shoulder Exercises: Supine   Flexion  AROM;Right;10 reps    Other Supine Exercises  supine 90 degree flexion circles clockwise, counterclockwise 30 x 2 each way 2 lb      Shoulder Exercises: Sidelying   External Rotation  AROM;Strengthening;Right;20 reps    ABduction  Strengthening;AROM;12 reps   with Min A     Shoulder Exercises: Standing   Row  Strengthening;Both;Other (comment)   3 x 15   Theraband Level (Shoulder  Row)  Level 3 (Green)      Shoulder Exercises: ROM/Strengthening   UBE (Upper Arm Bike)  L1 3 mins forward/reverse each      Shoulder Exercises: Isometric Strengthening   Flexion  5X5"    ABduction  5X5"      Shoulder Exercises: Stretch   Other Shoulder Stretches  seated upper trap stretch Rt 15 sec x 3    Other Shoulder Stretches  supine wand flexion 3 x 10, 2 lb       Modalities   Modalities  Electrical Stimulation      Electrical Stimulation   Electrical Stimulation Location  Rt shoulder    Electrical Stimulation Action  IFC    Electrical Stimulation Parameters  to tolerance    Electrical Stimulation Goals  Pain      Manual Therapy   Manual Therapy  Joint mobilization    Manual therapy comments  g2-g3 inferior joint mobilizations, PROM             PT Education - 01/20/20 [id number]    Education Details  Cues for intervention occasionally.    Person(s) Educated   Patient    Methods  Explanation;Verbal cues    Comprehension  Verbalized understanding       PT Short Term Goals - 01/03/20 1534      PT SHORT TERM GOAL #1   Title  Patient will demonstrate independent use of home exercise program to maintain progress from in clinic treatments.    Time  2    Period  Weeks    Status  New    Target Date  01/17/20        PT Long Term Goals - 01/03/20 1627      PT LONG TERM GOAL #1   Title  Patient will demonstrate/report pain at worst less than or equal to 2/10 to facilitate minimal limitation in daily activity secondary to pain symptoms.    Time  8    Period  Weeks    Status  New    Target Date  02/28/20      PT LONG TERM GOAL #2   Title  Patient will demonstrate independent use of home exercise program to facilitate ability to maintain/progress functional gains from skilled physical therapy services.    Time  8    Period  Weeks    Status  New    Target Date  02/28/20      PT LONG TERM GOAL #3   Title  Patient will demonstrate return to work/recreational activity at previous level of function without limitations secondary due to condition    Time  8    Period  Weeks    Status  New    Target Date  02/28/20      PT LONG TERM GOAL #4   Title  Pt. will demonstrate R GH joint AROM WFL s symptoms to facilitate usual dressing, self care, reaching at PLOF.    Time  8    Period  Weeks    Status  New    Target Date  02/28/20      PT LONG TERM GOAL #5   Title  Pt. will demonstrate RUE MMT 5/5 throughout to facilitate usual lifting, carrying in household and work activity at [employer].    Time  8    Period  Weeks    Status  New    Target Date  02/28/20            Plan -  01/20/20 0830    Clinical Impression Statement  Mild complaints noted c active abd, flexion progression c gravity resistance.  Reduced gravity improved control and reduced symptoms.  Continued skilled PT services indicated to progress AROM towards gravity resistance and  upright functional movements.    Examination-Activity Limitations  Bathing;Carry;Lift;Hygiene/Grooming;Sleep;Reach Overhead    Examination-Participation Restrictions  Youth worker;Other    Stability/Clinical Decision Making  Stable/Uncomplicated    Rehab Potential  Good    PT Frequency  2x / week    PT Duration  8 weeks    PT Treatment/Interventions  ADLs/Self Care Home Management;Electrical Stimulation;Traction;Moist Heat;Iontophoresis 4mg /ml Dexamethasone;Therapeutic activities;Therapeutic exercise;Neuromuscular re-education;Manual techniques;Patient/family education;Passive range of motion;Dry needling;Joint Manipulations;Spinal Manipulations;Taping;Vasopneumatic Device    PT Next Visit Plan  Continue to progress AAROM, AROM.    PT Home Exercise Plan  [id number]    Consulted and Agree with Plan of Care  Patient       Patient will benefit from skilled therapeutic intervention in order to improve the following deficits and impairments:  Decreased range of motion, Impaired UE functional use, Pain, Decreased mobility, Decreased strength  Visit Diagnosis: Acute pain of right shoulder  Muscle weakness (generalized)     Problem List Patient Active Problem List   Diagnosis Date Noted  . Morbid obesity ([place]) 09/16/2012  . HYPERLIPIDEMIA 09/06/2008  . UTI 09/06/2008  . FIBROMYALGIA 09/06/2008  . HEADACHE, CHRONIC 09/06/2008  . FIBROIDS, UTERUS 09/01/2008  . HYPOTHYROIDISM 09/01/2008  . GERD 09/01/2008    Scot Jun, PT, DPT, OCS, ATC 01/20/20  8:45 AM    [facility] Physical Therapy [address] [place], [place], [address] Phone: [phone]   Fax:  [phone]  Name: [name] MRN: [id number] Date of Birth: [birth year]

## 2020-01-25 ENCOUNTER — Other Ambulatory Visit: Payer: Self-pay

## 2020-01-25 ENCOUNTER — Encounter: Payer: Self-pay | Admitting: Rehabilitative and Restorative Service Providers"

## 2020-01-25 ENCOUNTER — Ambulatory Visit: Payer: 59 | Admitting: Rehabilitative and Restorative Service Providers"

## 2020-01-25 DIAGNOSIS — M6281 Muscle weakness (generalized): Secondary | ICD-10-CM

## 2020-01-25 DIAGNOSIS — M25511 Pain in right shoulder: Secondary | ICD-10-CM

## 2020-01-25 NOTE — Therapy (Signed)
[facility] Physical Therapy [address] [place], [place], [address] Phone: [phone]   Fax:  [phone]  Physical Therapy Treatment  Patient Details  Name: [name] MRN: [id number] Date of Birth: 22-Jan-1961 Referring Provider (PT): Dr. Marlou Sa   Encounter Date: 01/25/2020  PT End of Session - 01/25/20 0831    Visit Number  5    Number of Visits  16    Date for PT Re-Evaluation  02/28/20    PT Start Time  0800    PT Stop Time  0843    PT Time Calculation (min)  43 min    Activity Tolerance  Patient tolerated treatment well    Behavior During Therapy  [facility] for tasks assessed/performed       Past Medical History:  Diagnosis Date  . Anemia 2007   due to fibroids  . Duodenitis without hemorrhage   . Esophageal stricture   . Esophagitis   . GERD (gastroesophageal reflux disease)   . Hiatal hernia   . Hyperlipidemia   . Hypertension   . Hypothyroidism   . Leiomyoma of uterus, unspecified    fibroids  . Pneumonia    aspiration pneumonia after balloon surgery for weight loss    Past Surgical History:  Procedure Laterality Date  . BICEPT TENODESIS Right 12/07/2019   Procedure: BICEPS TENODESIS;  Surgeon: Meredith Pel, MD;  Location: [place];  Service: Orthopedics;  Laterality: Right;  . CARPAL TUNNEL RELEASE  2014   right wrist  . CHOLECYSTECTOMY    . HYSTEROSCOPY W/ ENDOMETRIAL ABLATION  4/05  . RECTOCELE REPAIR  2007  . SHOULDER ARTHROSCOPY WITH OPEN ROTATOR CUFF REPAIR Right 12/07/2019   Procedure: RIGHT SHOULDER ARTHROSCOPY,   MINI OPEN SUBSCAPULARIS, AND SUPRASPINATUS REPAIR, OPEN BICEPS TENODESIS;  Surgeon: Meredith Pel, MD;  Location: [place];  Service: Orthopedics;  Laterality: Right;  . TONSILLECTOMY     as child  . TOTAL VAGINAL HYSTERECTOMY  5/07  . TUBAL LIGATION  1991   BILATERAL    There were no vitals filed for this visit.  Subjective Assessment - 01/25/20 0815    Subjective   Pt. reported feeling reduced pain complaints and spasms since last visit.  Had migraine in last day or so and recovering from that.   Felt some pulling in upper arm when pulling box out from under table.    Limitations  --   Reaching, lifting, carrying   Patient Stated Goals  Return to work and activity.    Currently in Pain?  No/denies    Pain Score  0-No pain    Pain Location  Shoulder    Pain Orientation  Right    Pain Onset  More than a month ago    Aggravating Factors   Patient specific functional scale in ().  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  , eval 0/10)         [facility] PT Assessment - 01/25/20 0001      AROM   AROM Assessment Site  Shoulder    Right/Left Shoulder  Left;Right    Right Shoulder Flexion  128 Degrees    Right Shoulder ABduction  100 Degrees    Right Shoulder External Rotation  50 Degrees    Left Shoulder Flexion  --      PROM   Right Shoulder Flexion  130 Degrees    Right Shoulder ABduction  110 Degrees    Right Shoulder External Rotation  55 Degrees   measured in scaption                  [facility] Adult PT Treatment/Exercise - 01/25/20 0001      Shoulder Exercises: Supine   Flexion  AROM;Right   3 x 10   Other Supine Exercises  supine 90 degree flexion circles clockwise, counterclockwise 30 x 2 each way 2 lb    Other Supine Exercises  supine horizontal abduction green band 2 x 15      Shoulder Exercises: Sidelying   External Rotation  AROM;Strengthening;Right   3 x 10   ABduction  Strengthening;AROM;12 reps      Shoulder Exercises: Standing   Extension  Strengthening;Both   3 x 15   Theraband Level (Shoulder Extension)  Level 3 (Green)    Row  Strengthening;Both;Other (comment)   3 x 15   Theraband Level (Shoulder Row)  Level 3 (Green)    Other Standing Exercises  wall flexion x 15      Manual Therapy   Manual Therapy  Joint mobilization     Manual therapy comments  g2-g3 inferior joint mobilizations, PROM             PT Education - 01/25/20 0816    Education Details  Occasional cues for intervention adjustment.    Person(s) Educated  Patient    Methods  Explanation;Verbal cues    Comprehension  Verbalized understanding       PT Short Term Goals - 01/25/20 0825      PT SHORT TERM GOAL #1   Title  Patient will demonstrate independent use of home exercise program to maintain progress from in clinic treatments.    Time  2    Period  Weeks    Status  Achieved    Target Date  01/17/20        PT Long Term Goals - 01/03/20 1627      PT LONG TERM GOAL #1   Title  Patient will demonstrate/report pain at worst less than or equal to 2/10 to facilitate minimal limitation in daily activity secondary to pain symptoms.    Time  8    Period  Weeks    Status  New    Target Date  02/28/20      PT LONG TERM GOAL #2   Title  Patient will demonstrate independent use of home exercise program to facilitate ability to maintain/progress functional gains from skilled physical therapy services.    Time  8    Period  Weeks    Status  New    Target Date  02/28/20      PT LONG TERM GOAL #3   Title  Patient will demonstrate return to work/recreational activity at previous level of function without limitations secondary due to condition    Time  8    Period  Weeks    Status  New    Target Date  02/28/20      PT LONG TERM GOAL #4   Title  Pt. will demonstrate R GH joint AROM WFL s symptoms to facilitate usual dressing, self care, reaching at PLOF.    Time  8    Period  Weeks    Status  New    Target Date  02/28/20      PT LONG TERM GOAL #5   Title  Pt. will demonstrate RUE MMT 5/5 throughout to facilitate usual lifting,  carrying in household and work activity at [employer].    Time  8    Period  Weeks    Status  New    Target Date  02/28/20            Plan - 01/25/20 0825    Clinical Impression Statement  Pt. has  attended 5 visits during treatment cycle to this point.  Pt. has reported variable pain symptoms, generally reduced to mild to this point.  Pt. has demonstrated progression in mobility in objective data, as well as transition to AAROM/AROM activity to this point.  Pt. continues to present c Rt shoulder pain c mobility, strength deficits that impair full return to PLOF.  Continued skilled PT services indicated at this time.    Examination-Activity Limitations  Bathing;Carry;Lift;Hygiene/Grooming;Sleep;Reach Overhead    Examination-Participation Restrictions  Youth worker;Other    Stability/Clinical Decision Making  Stable/Uncomplicated    Rehab Potential  Good    PT Frequency  2x / week    PT Duration  8 weeks    PT Treatment/Interventions  ADLs/Self Care Home Management;Electrical Stimulation;Traction;Moist Heat;Iontophoresis 4mg /ml Dexamethasone;Therapeutic activities;Therapeutic exercise;Neuromuscular re-education;Manual techniques;Patient/family education;Passive range of motion;Dry needling;Joint Manipulations;Spinal Manipulations;Taping;Vasopneumatic Device    PT Next Visit Plan  Improve AROM, early strength.    PT Home Exercise Plan  [id number]    Consulted and Agree with Plan of Care  Patient       Patient will benefit from skilled therapeutic intervention in order to improve the following deficits and impairments:  Decreased range of motion, Impaired UE functional use, Pain, Decreased mobility, Decreased strength  Visit Diagnosis: Acute pain of right shoulder  Muscle weakness (generalized)     Problem List Patient Active Problem List   Diagnosis Date Noted  . Morbid obesity ([place]) 09/16/2012  . HYPERLIPIDEMIA 09/06/2008  . UTI 09/06/2008  . FIBROMYALGIA 09/06/2008  . HEADACHE, CHRONIC 09/06/2008  . FIBROIDS, UTERUS 09/01/2008  . HYPOTHYROIDISM 09/01/2008  . GERD 09/01/2008    Scot Jun, PT, DPT, OCS, ATC 01/25/20  8:35  AM    [facility] Physical Therapy [address] [place], [place], [address] Phone: [phone]   Fax:  [phone]  Name: RAJAE SEALEY MRN: [id number] Date of Birth: [birth year]

## 2020-01-26 ENCOUNTER — Ambulatory Visit: Payer: 59 | Admitting: Orthopedic Surgery

## 2020-01-26 DIAGNOSIS — S46011D Strain of muscle(s) and tendon(s) of the rotator cuff of right shoulder, subsequent encounter: Secondary | ICD-10-CM

## 2020-01-27 ENCOUNTER — Encounter: Payer: Self-pay | Admitting: Rehabilitative and Restorative Service Providers"

## 2020-01-27 ENCOUNTER — Ambulatory Visit: Payer: 59 | Admitting: Rehabilitative and Restorative Service Providers"

## 2020-01-27 ENCOUNTER — Other Ambulatory Visit: Payer: Self-pay

## 2020-01-27 DIAGNOSIS — M6281 Muscle weakness (generalized): Secondary | ICD-10-CM | POA: Diagnosis not present

## 2020-01-27 DIAGNOSIS — M25511 Pain in right shoulder: Secondary | ICD-10-CM

## 2020-01-27 MED FILL — LEVOTHYROXINE SODIUM 75 MCG: 75 | 90 days supply | Qty: 90 | Fill #2

## 2020-01-27 NOTE — Therapy (Signed)
[facility] Physical Therapy [address] [place], [place], [address] Phone: [phone]   Fax:  [phone]  Physical Therapy Treatment  Patient Details  Name: [name] MRN: [id number] Date of Birth: [birth year] Referring Provider (PT): Dr. Marlou Sa   Encounter Date: 01/27/2020  PT End of Session - 01/27/20 0907    Visit Number  6    Number of Visits  16    Date for PT Re-Evaluation  02/28/20    PT Start Time  0845    PT Stop Time  0927    PT Time Calculation (min)  42 min    Activity Tolerance  Patient tolerated treatment well    Behavior During Therapy  [facility] for tasks assessed/performed       Past Medical History:  Diagnosis Date  . Anemia 2007   due to fibroids  . Duodenitis without hemorrhage   . Esophageal stricture   . Esophagitis   . GERD (gastroesophageal reflux disease)   . Hiatal hernia   . Hyperlipidemia   . Hypertension   . Hypothyroidism   . Leiomyoma of uterus, unspecified    fibroids  . Pneumonia    aspiration pneumonia after balloon surgery for weight loss    Past Surgical History:  Procedure Laterality Date  . BICEPT TENODESIS Right 12/07/2019   Procedure: BICEPS TENODESIS;  Surgeon: Meredith Pel, MD;  Location: [place];  Service: Orthopedics;  Laterality: Right;  . CARPAL TUNNEL RELEASE  2014   right wrist  . CHOLECYSTECTOMY    . HYSTEROSCOPY W/ ENDOMETRIAL ABLATION  4/05  . RECTOCELE REPAIR  2007  . SHOULDER ARTHROSCOPY WITH OPEN ROTATOR CUFF REPAIR Right 12/07/2019   Procedure: RIGHT SHOULDER ARTHROSCOPY,   MINI OPEN SUBSCAPULARIS, AND SUPRASPINATUS REPAIR, OPEN BICEPS TENODESIS;  Surgeon: Meredith Pel, MD;  Location: [place];  Service: Orthopedics;  Laterality: Right;  . TONSILLECTOMY     as child  . TOTAL VAGINAL HYSTERECTOMY  5/07  . TUBAL LIGATION  1991   BILATERAL    There were no vitals filed for this visit.  Subjective Assessment - 01/27/20 0906    Subjective   Pt. indicated seeing MD and reported good results.  Return to MD in about 4 weeks.  Very mild complaints upon arrival today.    Limitations  --   Reaching, lifting, carrying   Patient Stated Goals  Return to work and activity.    Currently in Pain?  Yes    Pain Score  1     Pain Location  Shoulder    Pain Orientation  Right    Pain Onset  More than a month ago                       [facility] Adult PT Treatment/Exercise - 01/27/20 0001      Shoulder Exercises: Supine   Flexion  AROM;Strengthening;Left;Other (comment)   to fatigue 3.5 mins   Other Supine Exercises  supine horizontal abduction green band 3 x 10      Shoulder Exercises: Sidelying   External Rotation  AROM;Strengthening;Right;Other (comment)   1 lb 3 x 10   External Rotation Weight (lbs)  1    ABduction  Strengthening;AROM;20 reps    ABduction Limitations  min A required in concentric lift      Shoulder Exercises: Standing   Other Standing Exercises  wall flexion x 15,       Shoulder Exercises: Stretch   Cross  Chest Stretch  3 reps;30 seconds    External Rotation Stretch  3 reps;30 seconds   at doorway   Other Shoulder Stretches  seated pulleys 1 min flexion, 1 min scaption             PT Education - 01/27/20 0907    Education Details  Cues for intervention at times.    Person(s) Educated  Patient    Methods  Explanation;Verbal cues    Comprehension  Verbalized understanding;Returned demonstration       PT Short Term Goals - 01/25/20 0825      PT SHORT TERM GOAL #1   Title  Patient will demonstrate independent use of home exercise program to maintain progress from in clinic treatments.    Time  2    Period  Weeks    Status  Achieved    Target Date  01/17/20        PT Long Term Goals - 01/03/20 1627      PT LONG TERM GOAL #1   Title  Patient will demonstrate/report pain at worst less than or equal to 2/10 to facilitate minimal limitation in daily activity secondary to pain  symptoms.    Time  8    Period  Weeks    Status  New    Target Date  02/28/20      PT LONG TERM GOAL #2   Title  Patient will demonstrate independent use of home exercise program to facilitate ability to maintain/progress functional gains from skilled physical therapy services.    Time  8    Period  Weeks    Status  New    Target Date  02/28/20      PT LONG TERM GOAL #3   Title  Patient will demonstrate return to work/recreational activity at previous level of function without limitations secondary due to condition    Time  8    Period  Weeks    Status  New    Target Date  02/28/20      PT LONG TERM GOAL #4   Title  Pt. will demonstrate R GH joint AROM WFL s symptoms to facilitate usual dressing, self care, reaching at PLOF.    Time  8    Period  Weeks    Status  New    Target Date  02/28/20      PT LONG TERM GOAL #5   Title  Pt. will demonstrate RUE MMT 5/5 throughout to facilitate usual lifting, carrying in household and work activity at [employer].    Time  8    Period  Weeks    Status  New    Target Date  02/28/20            Plan - 01/27/20 0918    Clinical Impression Statement  Supine and sidelying AROM improving but impairments still noted.  Guarding in passive mobility noted more than any joint restriction.  Seated elevation attempt showed eary shrug sign.    Examination-Activity Limitations  Bathing;Carry;Lift;Hygiene/Grooming;Sleep;Reach Overhead    Examination-Participation Restrictions  Youth worker;Other    Stability/Clinical Decision Making  Stable/Uncomplicated    Rehab Potential  Good    PT Frequency  2x / week    PT Duration  8 weeks    PT Treatment/Interventions  ADLs/Self Care Home Management;Electrical Stimulation;Traction;Moist Heat;Iontophoresis 4mg /ml Dexamethasone;Therapeutic activities;Therapeutic exercise;Neuromuscular re-education;Manual techniques;Patient/family education;Passive range of motion;Dry  needling;Joint Manipulations;Spinal Manipulations;Taping;Vasopneumatic Device    PT Next Visit Plan  Continue AROM c gravity  reduced, progress end range mobility.    PT Home Exercise Plan  [id number]    Consulted and Agree with Plan of Care  Patient       Patient will benefit from skilled therapeutic intervention in order to improve the following deficits and impairments:  Decreased range of motion, Impaired UE functional use, Pain, Decreased mobility, Decreased strength  Visit Diagnosis: Acute pain of right shoulder  Muscle weakness (generalized)     Problem List Patient Active Problem List   Diagnosis Date Noted  . Morbid obesity ([place]) 09/16/2012  . HYPERLIPIDEMIA 09/06/2008  . UTI 09/06/2008  . FIBROMYALGIA 09/06/2008  . HEADACHE, CHRONIC 09/06/2008  . FIBROIDS, UTERUS 09/01/2008  . HYPOTHYROIDISM 09/01/2008  . GERD 09/01/2008   Scot Jun, PT, DPT, OCS, ATC 01/27/20  9:20 AM    [facility] Physical Therapy [address] [place], [place], [address] Phone: [phone]   Fax:  [phone]  Name: [name] MRN: [id number] Date of Birth: Feb 25, 1961

## 2020-01-28 ENCOUNTER — Encounter: Payer: Self-pay | Admitting: Orthopedic Surgery

## 2020-01-28 NOTE — Progress Notes (Signed)
Post-Op Visit Note   Patient: [name]           Date of Birth: [birth year]           MRN: [id number] Visit Date: 01/26/2020 PCP: Crist Infante, MD   Assessment & Plan:  Chief Complaint:  Chief Complaint  Patient presents with  . Post-op Follow-up   Visit Diagnoses: No diagnosis found.  Plan: Patient is a 59 year old female who presents s/p right shoulder arthroscopy with mini open rotator cuff repair on 12/07/2019.  Patient notes that she is doing well overall and progressing with physical therapy.  She is working on active range of motion as well as using exercise bands for strengthening.  She is doing on this facility upstairs on Tuesday/Thursday.  Patient reports that she has not plateaued yet in regards to her therapy progress.  She is taking occasional Robaxin and ibuprofen for pain relief.  On exam she has improved external rotation and 85 degrees of abduction with 85 degrees of forward flexion.  She notes that she is sleeping better but she cannot lay on her right side just yet.  She is back to work and this is going well but her accommodations run out on April 6.  Plan for patient to purchase a shoulder pulley to use to work on forward flexion.  She will continue to use this before her next appointment.  Plan for patient to follow-up before April 6 so that we can determine her work status.  Follow-Up Instructions: No follow-ups on file.   Orders:  No orders of the defined types were placed in this encounter.  No orders of the defined types were placed in this encounter.   Imaging: No results found.  PMFS History: Patient Active Problem List   Diagnosis Date Noted  . Morbid obesity ([place]) 09/16/2012  . HYPERLIPIDEMIA 09/06/2008  . UTI 09/06/2008  . FIBROMYALGIA 09/06/2008  . HEADACHE, CHRONIC 09/06/2008  . FIBROIDS, UTERUS 09/01/2008  . HYPOTHYROIDISM 09/01/2008  . GERD 09/01/2008   Past Medical History:  Diagnosis Date  . Anemia 2007   due to fibroids  .  Duodenitis without hemorrhage   . Esophageal stricture   . Esophagitis   . GERD (gastroesophageal reflux disease)   . Hiatal hernia   . Hyperlipidemia   . Hypertension   . Hypothyroidism   . Leiomyoma of uterus, unspecified    fibroids  . Pneumonia    aspiration pneumonia after balloon surgery for weight loss    Family History  Problem Relation Age of Onset  . Celiac disease Mother   . Osteoporosis Mother   . Heart disease Father   . Stroke Father   . Irritable bowel syndrome Sister   . Lung cancer Maternal Grandmother   . Breast cancer Paternal Grandmother   . Heart disease Sister   . Melanoma Sister   . Colon cancer Neg Hx   . Esophageal cancer Neg Hx   . Stomach cancer Neg Hx   . Rectal cancer Neg Hx     Past Surgical History:  Procedure Laterality Date  . BICEPT TENODESIS Right 12/07/2019   Procedure: BICEPS TENODESIS;  Surgeon: Meredith Pel, MD;  Location: [place];  Service: Orthopedics;  Laterality: Right;  . CARPAL TUNNEL RELEASE  2014   right wrist  . CHOLECYSTECTOMY    . HYSTEROSCOPY W/ ENDOMETRIAL ABLATION  4/05  . RECTOCELE REPAIR  2007  . SHOULDER ARTHROSCOPY WITH OPEN ROTATOR CUFF REPAIR Right 12/07/2019  Procedure: RIGHT SHOULDER ARTHROSCOPY,   MINI OPEN SUBSCAPULARIS, AND SUPRASPINATUS REPAIR, OPEN BICEPS TENODESIS;  Surgeon: Meredith Pel, MD;  Location: [place];  Service: Orthopedics;  Laterality: Right;  . TONSILLECTOMY     as child  . TOTAL VAGINAL HYSTERECTOMY  5/07  . TUBAL LIGATION  1991   BILATERAL   Social History   Occupational History  . Occupation: Nurse  Tobacco Use  . Smoking status: Former Smoker    Quit date: 12/23/2002    Years since quitting: 17.1  . Smokeless tobacco: Never Used  Substance and Sexual Activity  . Alcohol use: Yes    Comment: rare  . Drug use: No  . Sexual activity: Yes    Partners: Male    Birth control/protection: Surgical    Comment: [facility]

## 2020-02-01 ENCOUNTER — Other Ambulatory Visit: Payer: Self-pay

## 2020-02-01 ENCOUNTER — Ambulatory Visit: Payer: 59 | Admitting: Rehabilitative and Restorative Service Providers"

## 2020-02-01 ENCOUNTER — Encounter: Payer: Self-pay | Admitting: Rehabilitative and Restorative Service Providers"

## 2020-02-01 DIAGNOSIS — M25511 Pain in right shoulder: Secondary | ICD-10-CM | POA: Diagnosis not present

## 2020-02-01 DIAGNOSIS — M6281 Muscle weakness (generalized): Secondary | ICD-10-CM

## 2020-02-01 NOTE — Therapy (Signed)
[facility] Physical Therapy [address] [place], [place], [address] Phone: [phone]   Fax:  [phone]  Physical Therapy Treatment  Patient Details  Name: [name] MRN: [id number] Date of Birth: [birth year] Referring Provider (PT): Dr. Marlou Sa   Encounter Date: 02/01/2020  PT End of Session - 02/01/20 0838    Visit Number  7    Number of Visits  16    Date for PT Re-Evaluation  02/28/20    PT Start Time  0759    PT Stop Time  0840    PT Time Calculation (min)  41 min    Activity Tolerance  Patient tolerated treatment well    Behavior During Therapy  [facility] for tasks assessed/performed       Past Medical History:  Diagnosis Date  . Anemia 2007   due to fibroids  . Duodenitis without hemorrhage   . Esophageal stricture   . Esophagitis   . GERD (gastroesophageal reflux disease)   . Hiatal hernia   . Hyperlipidemia   . Hypertension   . Hypothyroidism   . Leiomyoma of uterus, unspecified    fibroids  . Pneumonia    aspiration pneumonia after balloon surgery for weight loss    Past Surgical History:  Procedure Laterality Date  . BICEPT TENODESIS Right 12/07/2019   Procedure: BICEPS TENODESIS;  Surgeon: Meredith Pel, MD;  Location: [place];  Service: Orthopedics;  Laterality: Right;  . CARPAL TUNNEL RELEASE  2014   right wrist  . CHOLECYSTECTOMY    . HYSTEROSCOPY W/ ENDOMETRIAL ABLATION  4/05  . RECTOCELE REPAIR  2007  . SHOULDER ARTHROSCOPY WITH OPEN ROTATOR CUFF REPAIR Right 12/07/2019   Procedure: RIGHT SHOULDER ARTHROSCOPY,   MINI OPEN SUBSCAPULARIS, AND SUPRASPINATUS REPAIR, OPEN BICEPS TENODESIS;  Surgeon: Meredith Pel, MD;  Location: [place];  Service: Orthopedics;  Laterality: Right;  . TONSILLECTOMY     as child  . TOTAL VAGINAL HYSTERECTOMY  5/07  . TUBAL LIGATION  1991   BILATERAL    There were no vitals filed for this visit.  Subjective Assessment - 02/01/20 0806    Subjective   Pt. stated feeling some stiffness in morning.  Otherwise pain is doing better than a few weeks ago.    Limitations  --   Reaching, lifting, carrying   Patient Stated Goals  Return to work and activity.    Currently in Pain?  No/denies    Pain Score  0-No pain    Pain Location  Shoulder    Pain Orientation  Right    Pain Onset  More than a month ago                       [facility] Adult PT Treatment/Exercise - 02/01/20 0001      Shoulder Exercises: Supine   Flexion  AROM;Strengthening;Right   0.5 lbs to fatigue 5 mins   Other Supine Exercises  supine 2 lb wand flexion AAROM 2 x 10    Other Supine Exercises  supine horizontal abduction green band 3 x 10      Shoulder Exercises: Sidelying   External Rotation  AROM;Strengthening;Right;Other (comment)   3 x 10   External Rotation Weight (lbs)  1.5    Flexion  AROM;Strengthening;Right;20 reps    ABduction  Strengthening;AROM;20 reps      Shoulder Exercises: Standing   Other Standing Exercises  wall flexion x 15,     Other Standing Exercises  standing 90 deg ball circles 30 x clockwise, counterclockwise      Shoulder Exercises: ROM/Strengthening   UBE (Upper Arm Bike)  L 1.5 3 mins each way fwd/back      Manual Therapy   Manual Therapy  Joint mobilization    Manual therapy comments  g2-g3 inferior joint mobilizations, PROM             PT Education - 02/01/20 0806    Education Details  Cues for intervention.    Person(s) Educated  Patient    Methods  Explanation;Verbal cues;Demonstration    Comprehension  Verbalized understanding;Returned demonstration       PT Short Term Goals - 01/25/20 0825      PT SHORT TERM GOAL #1   Title  Patient will demonstrate independent use of home exercise program to maintain progress from in clinic treatments.    Time  2    Period  Weeks    Status  Achieved    Target Date  01/17/20        PT Long Term Goals - 01/03/20 1627      PT LONG TERM GOAL #1   Title  Patient  will demonstrate/report pain at worst less than or equal to 2/10 to facilitate minimal limitation in daily activity secondary to pain symptoms.    Time  8    Period  Weeks    Status  New    Target Date  02/28/20      PT LONG TERM GOAL #2   Title  Patient will demonstrate independent use of home exercise program to facilitate ability to maintain/progress functional gains from skilled physical therapy services.    Time  8    Period  Weeks    Status  New    Target Date  02/28/20      PT LONG TERM GOAL #3   Title  Patient will demonstrate return to work/recreational activity at previous level of function without limitations secondary due to condition    Time  8    Period  Weeks    Status  New    Target Date  02/28/20      PT LONG TERM GOAL #4   Title  Pt. will demonstrate R GH joint AROM WFL s symptoms to facilitate usual dressing, self care, reaching at PLOF.    Time  8    Period  Weeks    Status  New    Target Date  02/28/20      PT LONG TERM GOAL #5   Title  Pt. will demonstrate RUE MMT 5/5 throughout to facilitate usual lifting, carrying in household and work activity at [employer].    Time  8    Period  Weeks    Status  New    Target Date  02/28/20            Plan - 02/01/20 0837    Clinical Impression Statement  Improvements noted in AROM ability and quality since last visit. Difficulty still present and easy fatigue noted in all planes.  Continued skilled PT services indicated to continue progression.    Examination-Activity Limitations  Bathing;Carry;Lift;Hygiene/Grooming;Sleep;Reach Overhead    Examination-Participation Restrictions  Youth worker;Other    Stability/Clinical Decision Making  Stable/Uncomplicated    Rehab Potential  Good    PT Frequency  2x / week    PT Duration  8 weeks    PT Treatment/Interventions  ADLs/Self Care Home Management;Electrical Stimulation;Traction;Moist Heat;Iontophoresis 4mg /ml  Dexamethasone;Therapeutic activities;Therapeutic exercise;Neuromuscular  re-education;Manual techniques;Patient/family education;Passive range of motion;Dry needling;Joint Manipulations;Spinal Manipulations;Taping;Vasopneumatic Device    PT Next Visit Plan  Progress sidelying and standing AROM, strengthening as appropriate.    PT Home Exercise Plan  [id number]    Consulted and Agree with Plan of Care  Patient       Patient will benefit from skilled therapeutic intervention in order to improve the following deficits and impairments:  Decreased range of motion, Impaired UE functional use, Pain, Decreased mobility, Decreased strength  Visit Diagnosis: Acute pain of right shoulder  Muscle weakness (generalized)     Problem List Patient Active Problem List   Diagnosis Date Noted  . Morbid obesity ([place]) 09/16/2012  . HYPERLIPIDEMIA 09/06/2008  . UTI 09/06/2008  . FIBROMYALGIA 09/06/2008  . HEADACHE, CHRONIC 09/06/2008  . FIBROIDS, UTERUS 09/01/2008  . HYPOTHYROIDISM 09/01/2008  . GERD 09/01/2008   Scot Jun, PT, DPT, OCS, ATC 02/01/20  8:40 AM    [facility] Physical Therapy [address] [place], [place], [address] Phone: [phone]   Fax:  [phone]  Name: EMORI ZWIEG MRN: [id number] Date of Birth: 08-Feb-1961

## 2020-02-03 ENCOUNTER — Other Ambulatory Visit: Payer: Self-pay

## 2020-02-03 ENCOUNTER — Ambulatory Visit: Payer: 59 | Admitting: Rehabilitative and Restorative Service Providers"

## 2020-02-03 ENCOUNTER — Encounter: Payer: Self-pay | Admitting: Rehabilitative and Restorative Service Providers"

## 2020-02-03 DIAGNOSIS — M25511 Pain in right shoulder: Secondary | ICD-10-CM

## 2020-02-03 DIAGNOSIS — M6281 Muscle weakness (generalized): Secondary | ICD-10-CM | POA: Diagnosis not present

## 2020-02-03 NOTE — Therapy (Signed)
[facility] Physical Therapy [address] [place], [place], [address] Phone: [phone]   Fax:  [phone]  Physical Therapy Treatment  Patient Details  Name: [name] MRN: [id number] Date of Birth: April 29, 1961 Referring Provider (PT): Dr. Marlou Sa   Encounter Date: 02/03/2020  PT End of Session - 02/03/20 0838    Visit Number  8    Number of Visits  16    Date for PT Re-Evaluation  02/28/20    PT Start Time  0800    PT Stop Time  0845    PT Time Calculation (min)  45 min    Activity Tolerance  Patient tolerated treatment well    Behavior During Therapy  [facility] for tasks assessed/performed       Past Medical History:  Diagnosis Date  . Anemia 2007   due to fibroids  . Duodenitis without hemorrhage   . Esophageal stricture   . Esophagitis   . GERD (gastroesophageal reflux disease)   . Hiatal hernia   . Hyperlipidemia   . Hypertension   . Hypothyroidism   . Leiomyoma of uterus, unspecified    fibroids  . Pneumonia    aspiration pneumonia after balloon surgery for weight loss    Past Surgical History:  Procedure Laterality Date  . BICEPT TENODESIS Right 12/07/2019   Procedure: BICEPS TENODESIS;  Surgeon: Meredith Pel, MD;  Location: [place];  Service: Orthopedics;  Laterality: Right;  . CARPAL TUNNEL RELEASE  2014   right wrist  . CHOLECYSTECTOMY    . HYSTEROSCOPY W/ ENDOMETRIAL ABLATION  4/05  . RECTOCELE REPAIR  2007  . SHOULDER ARTHROSCOPY WITH OPEN ROTATOR CUFF REPAIR Right 12/07/2019   Procedure: RIGHT SHOULDER ARTHROSCOPY,   MINI OPEN SUBSCAPULARIS, AND SUPRASPINATUS REPAIR, OPEN BICEPS TENODESIS;  Surgeon: Meredith Pel, MD;  Location: [place];  Service: Orthopedics;  Laterality: Right;  . TONSILLECTOMY     as child  . TOTAL VAGINAL HYSTERECTOMY  5/07  . TUBAL LIGATION  1991   BILATERAL    There were no vitals filed for this visit.  Subjective Assessment - 02/03/20 0826    Subjective   Pt. indicated feeling pretty good this morning with no complaints of pain.    Limitations  --   Reaching, lifting, carrying   Patient Stated Goals  Return to work and activity.    Pain Score  0-No pain    Pain Location  Shoulder    Pain Orientation  Right    Pain Descriptors / Indicators  Shooting    Pain Type  Acute pain;Surgical pain    Pain Onset  More than a month ago                       [facility] Adult PT Treatment/Exercise - 02/03/20 0001      Shoulder Exercises: Supine   Horizontal ABduction  Strengthening;Both   3 x 10    Theraband Level (Shoulder Horizontal ABduction)  Level 3 (Green)    Flexion  AROM;Strengthening;Right   1 lb   Other Supine Exercises  supine 2 lb wand flexion AAROM 2 x 10      Shoulder Exercises: Sidelying   External Rotation  AROM;Strengthening;Right;Other (comment)   3 x 10   External Rotation Weight (lbs)  2    Flexion  AROM;Strengthening;Right;20 reps    Flexion Weight (lbs)  1    ABduction  Strengthening;AROM   3 x 10   ABduction Weight (  lbs)  1      Shoulder Exercises: Standing   Flexion  AROM;Strengthening;Right;20 reps      Shoulder Exercises: ROM/Strengthening   UBE (Upper Arm Bike)  L 1.5 3 mins each way fwd/back      Manual Therapy   Manual therapy comments  g2-g3 inferior joint mobilizations, PROM             PT Education - 02/03/20 0826    Education Details  Cues for intervention.    Person(s) Educated  Patient    Methods  Explanation;Demonstration;Verbal cues    Comprehension  Verbalized understanding;Returned demonstration       PT Short Term Goals - 01/25/20 0825      PT SHORT TERM GOAL #1   Title  Patient will demonstrate independent use of home exercise program to maintain progress from in clinic treatments.    Time  2    Period  Weeks    Status  Achieved    Target Date  01/17/20        PT Long Term Goals - 01/03/20 1627      PT LONG TERM GOAL #1   Title  Patient will  demonstrate/report pain at worst less than or equal to 2/10 to facilitate minimal limitation in daily activity secondary to pain symptoms.    Time  8    Period  Weeks    Status  New    Target Date  02/28/20      PT LONG TERM GOAL #2   Title  Patient will demonstrate independent use of home exercise program to facilitate ability to maintain/progress functional gains from skilled physical therapy services.    Time  8    Period  Weeks    Status  New    Target Date  02/28/20      PT LONG TERM GOAL #3   Title  Patient will demonstrate return to work/recreational activity at previous level of function without limitations secondary due to condition    Time  8    Period  Weeks    Status  New    Target Date  02/28/20      PT LONG TERM GOAL #4   Title  Pt. will demonstrate R GH joint AROM WFL s symptoms to facilitate usual dressing, self care, reaching at PLOF.    Time  8    Period  Weeks    Status  New    Target Date  02/28/20      PT LONG TERM GOAL #5   Title  Pt. will demonstrate RUE MMT 5/5 throughout to facilitate usual lifting, carrying in household and work activity at [employer].    Time  8    Period  Weeks    Status  New    Target Date  02/28/20            Plan - 02/03/20 0828    Clinical Impression Statement  Continued steady progression in active mobility, progressive strengthening.    Examination-Activity Limitations  Bathing;Carry;Lift;Hygiene/Grooming;Sleep;Reach Overhead    Examination-Participation Restrictions  Youth worker;Other    Stability/Clinical Decision Making  Stable/Uncomplicated    Rehab Potential  Good    PT Frequency  2x / week    PT Duration  8 weeks    PT Treatment/Interventions  ADLs/Self Care Home Management;Electrical Stimulation;Traction;Moist Heat;Iontophoresis 4mg /ml Dexamethasone;Therapeutic activities;Therapeutic exercise;Neuromuscular re-education;Manual techniques;Patient/family education;Passive range  of motion;Dry needling;Joint Manipulations;Spinal Manipulations;Taping;Vasopneumatic Device    PT Next Visit Plan  Progress to gravity  based resistance in AROM    PT Home Exercise Plan  [id number]    Consulted and Agree with Plan of Care  Patient       Patient will benefit from skilled therapeutic intervention in order to improve the following deficits and impairments:  Decreased range of motion, Impaired UE functional use, Pain, Decreased mobility, Decreased strength  Visit Diagnosis: Acute pain of right shoulder  Muscle weakness (generalized)     Problem List Patient Active Problem List   Diagnosis Date Noted  . Morbid obesity ([place]) 09/16/2012  . HYPERLIPIDEMIA 09/06/2008  . UTI 09/06/2008  . FIBROMYALGIA 09/06/2008  . HEADACHE, CHRONIC 09/06/2008  . FIBROIDS, UTERUS 09/01/2008  . HYPOTHYROIDISM 09/01/2008  . GERD 09/01/2008    Scot Jun, PT, DPT, OCS, ATC 02/03/20  8:50 AM    [facility] Physical Therapy [address] [place], [place], [address] Phone: [phone]   Fax:  [phone]  Name: [name] MRN: [id number] Date of Birth: Jan 30, 1961

## 2020-02-07 MED FILL — OMEPRAZOLE 40 MG CPDR: 40 | 90 days supply | Qty: 180 | Fill #2

## 2020-02-07 MED FILL — PRAMIPEXOLE 0.125 MG TABLET: 0.125 | 90 days supply | Qty: 90 | Fill #3

## 2020-02-08 ENCOUNTER — Encounter: Payer: 59 | Admitting: Rehabilitative and Restorative Service Providers"

## 2020-02-08 ENCOUNTER — Telehealth: Payer: Self-pay | Admitting: Rehabilitative and Restorative Service Providers"

## 2020-02-08 DIAGNOSIS — Z760 Encounter for issue of repeat prescription: Secondary | ICD-10-CM | POA: Diagnosis not present

## 2020-02-08 NOTE — Telephone Encounter (Signed)
Called Pt. After no show for appointment by 15 mins.  Pt. Indicated her mother is in hospital and she didn't have a chance to cal and let us know.   Appointment scheduled for 02/10/2020.  Advised Pt. To call and schedule additional visits for next few weeks as well.   Scot Jun, PT, DPT, OCS, ATC 02/08/20  8:21 AM

## 2020-02-10 ENCOUNTER — Ambulatory Visit: Payer: 59 | Admitting: Rehabilitative and Restorative Service Providers"

## 2020-02-10 ENCOUNTER — Other Ambulatory Visit: Payer: Self-pay

## 2020-02-10 ENCOUNTER — Encounter: Payer: Self-pay | Admitting: Rehabilitative and Restorative Service Providers"

## 2020-02-10 DIAGNOSIS — M6281 Muscle weakness (generalized): Secondary | ICD-10-CM | POA: Diagnosis not present

## 2020-02-10 DIAGNOSIS — M25511 Pain in right shoulder: Secondary | ICD-10-CM

## 2020-02-10 NOTE — Therapy (Addendum)
[facility] Physical Therapy [address] [place], [place], [address] Phone: [phone]   Fax:  [phone]  Physical Therapy Treatment/Discharge  Patient Details  Name: [name] MRN: [id number] Date of Birth: 04-Mar-1961 Referring Provider (PT): Dr. Marlou Sa   Encounter Date: 02/10/2020  PT End of Session - 02/10/20 0830    Visit Number  9    Number of Visits  16    Date for PT Re-Evaluation  02/28/20    PT Start Time  0802    PT Stop Time  0842    PT Time Calculation (min)  40 min    Activity Tolerance  Patient tolerated treatment well    Behavior During Therapy  [facility] for tasks assessed/performed       Past Medical History:  Diagnosis Date  . Anemia 2007   due to fibroids  . Duodenitis without hemorrhage   . Esophageal stricture   . Esophagitis   . GERD (gastroesophageal reflux disease)   . Hiatal hernia   . Hyperlipidemia   . Hypertension   . Hypothyroidism   . Leiomyoma of uterus, unspecified    fibroids  . Pneumonia    aspiration pneumonia after balloon surgery for weight loss    Past Surgical History:  Procedure Laterality Date  . BICEPT TENODESIS Right 12/07/2019   Procedure: BICEPS TENODESIS;  Surgeon: Meredith Pel, MD;  Location: [place];  Service: Orthopedics;  Laterality: Right;  . CARPAL TUNNEL RELEASE  2014   right wrist  . CHOLECYSTECTOMY    . HYSTEROSCOPY W/ ENDOMETRIAL ABLATION  4/05  . RECTOCELE REPAIR  2007  . SHOULDER ARTHROSCOPY WITH OPEN ROTATOR CUFF REPAIR Right 12/07/2019   Procedure: RIGHT SHOULDER ARTHROSCOPY,   MINI OPEN SUBSCAPULARIS, AND SUPRASPINATUS REPAIR, OPEN BICEPS TENODESIS;  Surgeon: Meredith Pel, MD;  Location: [place];  Service: Orthopedics;  Laterality: Right;  . TONSILLECTOMY     as child  . TOTAL VAGINAL HYSTERECTOMY  5/07  . TUBAL LIGATION  1991   BILATERAL    There were no vitals filed for this visit.  Subjective Assessment - 02/10/20 0823    Subjective  Pt. indicated decreased HEP due to busy week with work and mother in hospital.  No pain increases, just soreness at times.  Doing more at work with arm.    Limitations  --   Reaching, lifting, carrying   Patient Stated Goals  Return to work and activity.    Currently in Pain?  No/denies    Pain Score  0-No pain    Pain Location  Shoulder    Pain Orientation  Right    Pain Onset  More than a month ago                       [facility] Adult PT Treatment/Exercise - 02/10/20 0001      Shoulder Exercises: Supine   Other Supine Exercises  supine 30 deg elevation 3 x 15 1 lb      Shoulder Exercises: Standing   External Rotation  Strengthening;Right;20 reps    Theraband Level (Shoulder External Rotation)  Level 3 (Green)    Internal Rotation  AROM;Strengthening    Theraband Level (Shoulder Internal Rotation)  Level 3 (Green)    Other Standing Exercises  standing 90 deg flexion to abd and reverse 2 x 10    Other Standing Exercises  standing 90 deg ball circles 30 x 2 clockwise, counterclockwise  Shoulder Exercises: ROM/Strengthening   UBE (Upper Arm Bike)  L 1.5 4 mins each way fwd/back      Manual Therapy   Manual therapy comments  g2-g3 inferior joint mobilizations, PROM             PT Education - 02/10/20 7564    Education Details  Cues for intervention at times.    Person(s) Educated  Patient    Methods  Explanation;Demonstration;Verbal cues    Comprehension  Verbalized understanding;Returned demonstration       PT Short Term Goals - 01/25/20 0825      PT SHORT TERM GOAL #1   Title  Patient will demonstrate independent use of home exercise program to maintain progress from in clinic treatments.    Time  2    Period  Weeks    Status  Achieved    Target Date  01/17/20        PT Long Term Goals - 01/03/20 1627      PT LONG TERM GOAL #1   Title  Patient will demonstrate/report pain at worst less than or equal to 2/10 to facilitate minimal  limitation in daily activity secondary to pain symptoms.    Time  8    Period  Weeks    Status  New    Target Date  02/28/20      PT LONG TERM GOAL #2   Title  Patient will demonstrate independent use of home exercise program to facilitate ability to maintain/progress functional gains from skilled physical therapy services.    Time  8    Period  Weeks    Status  New    Target Date  02/28/20      PT LONG TERM GOAL #3   Title  Patient will demonstrate return to work/recreational activity at previous level of function without limitations secondary due to condition    Time  8    Period  Weeks    Status  New    Target Date  02/28/20      PT LONG TERM GOAL #4   Title  Pt. will demonstrate R GH joint AROM WFL s symptoms to facilitate usual dressing, self care, reaching at PLOF.    Time  8    Period  Weeks    Status  New    Target Date  02/28/20      PT LONG TERM GOAL #5   Title  Pt. will demonstrate RUE MMT 5/5 throughout to facilitate usual lifting, carrying in household and work activity at [employer].    Time  8    Period  Weeks    Status  New    Target Date  02/28/20            Plan - 02/10/20 0829    Clinical Impression Statement  Adjusted intervention today to avoid over activity due to psychological stress this week.  Doing well in active movement progression against gravity.    Examination-Activity Limitations  Bathing;Carry;Lift;Hygiene/Grooming;Sleep;Reach Overhead    Examination-Participation Restrictions  Youth worker;Other    Stability/Clinical Decision Making  Stable/Uncomplicated    Rehab Potential  Good    PT Frequency  2x / week    PT Duration  8 weeks    PT Treatment/Interventions  ADLs/Self Care Home Management;Electrical Stimulation;Traction;Moist Heat;Iontophoresis 69m/ml Dexamethasone;Therapeutic activities;Therapeutic exercise;Neuromuscular re-education;Manual techniques;Patient/family education;Passive range of  motion;Dry needling;Joint Manipulations;Spinal Manipulations;Taping;Vasopneumatic Device    PT Next Visit Plan  Progress to gravity based resistance in AROM c strengthening  as appropriate.    PT Home Exercise Plan  [id number]    Consulted and Agree with Plan of Care  Patient       Patient will benefit from skilled therapeutic intervention in order to improve the following deficits and impairments:  Decreased range of motion, Impaired UE functional use, Pain, Decreased mobility, Decreased strength  Visit Diagnosis: Acute pain of right shoulder  Muscle weakness (generalized)     Problem List Patient Active Problem List   Diagnosis Date Noted  . Morbid obesity ([place]) 09/16/2012  . HYPERLIPIDEMIA 09/06/2008  . UTI 09/06/2008  . FIBROMYALGIA 09/06/2008  . HEADACHE, CHRONIC 09/06/2008  . FIBROIDS, UTERUS 09/01/2008  . HYPOTHYROIDISM 09/01/2008  . GERD 09/01/2008   Scot Jun, PT, DPT, OCS, ATC 02/10/20  8:37 AM   PHYSICAL THERAPY DISCHARGE SUMMARY  Visits from Start of Care: 9  Current functional level related to goals / functional outcomes: See note   Remaining deficits: See note   Education / Equipment: HEP Plan: Patient agrees to discharge.  Patient goals were partially met. Patient is being discharged due to being pleased with the current functional level.  ?????     Scot Jun, PT, DPT, OCS, ATC 04/20/20  9:17 AM        [facility] Physical Therapy [address] [place], [place], [address] Phone: [phone]   Fax:  [phone]  Name: KEIRAH KONITZER MRN: [id number] Date of Birth: September 17, 1961

## 2020-02-21 MED FILL — PREMARIN 0.625 MG TABLET: 0.625 | 90 days supply | Qty: 90 | Fill #1

## 2020-02-23 ENCOUNTER — Ambulatory Visit (INDEPENDENT_AMBULATORY_CARE_PROVIDER_SITE_OTHER): Payer: 59 | Admitting: Orthopedic Surgery

## 2020-02-23 ENCOUNTER — Encounter: Payer: Self-pay | Admitting: Orthopedic Surgery

## 2020-02-23 ENCOUNTER — Other Ambulatory Visit: Payer: Self-pay

## 2020-02-23 VITALS — Ht 65.0 in | Wt 184.0 lb

## 2020-02-23 DIAGNOSIS — S46011D Strain of muscle(s) and tendon(s) of the rotator cuff of right shoulder, subsequent encounter: Secondary | ICD-10-CM

## 2020-02-25 ENCOUNTER — Encounter: Payer: Self-pay | Admitting: Orthopedic Surgery

## 2020-02-25 NOTE — Progress Notes (Signed)
Post-Op Visit Note   Patient: [name]           Date of Birth: [birth year]           MRN: [id number] Visit Date: 02/23/2020 PCP: Crist Infante, MD   Assessment & Plan:  Chief Complaint:  Chief Complaint  Patient presents with  . Right Shoulder - Follow-up    12/07/2019 Right Shoulder Arthroscopy, Mini ORCR   Visit Diagnoses:  1. Traumatic complete tear of right rotator cuff, subsequent encounter     Plan: Patient is a 59 year old female who presents s/p right shoulder arthroscopy with mini open rotator cuff repair.  She is a little less than 3 months out from surgery.  She is doing well and is satisfied with her progress.  She is not taking any medications for pain.  She has completed physical therapy and now has transition to home exercise program.  She has been using a shoulder pulley over the last month and states that this is improved her motion.  She is able to sleep on her right side but occasionally will wake up from pain.  She has 90 degrees of abduction, 95 to 100 degrees of forward flexion, 35 degrees of external rotation.  She has 5/5 motor strength of supraspinatus, infraspinatus, subscapularis muscles.  Incisions have healed well.  Her work restrictions expire on April 6.  Provided new work note for her explicitly stating no lifting greater than 15 pounds for the next 2 months upon her return to work.  Patient will follow up with the office as needed.  Follow-Up Instructions: No follow-ups on file.   Orders:  No orders of the defined types were placed in this encounter.  No orders of the defined types were placed in this encounter.   Imaging: No results found.  PMFS History: Patient Active Problem List   Diagnosis Date Noted  . Morbid obesity ([place]) 09/16/2012  . HYPERLIPIDEMIA 09/06/2008  . UTI 09/06/2008  . FIBROMYALGIA 09/06/2008  . HEADACHE, CHRONIC 09/06/2008  . FIBROIDS, UTERUS 09/01/2008  . HYPOTHYROIDISM 09/01/2008  . GERD 09/01/2008   Past  Medical History:  Diagnosis Date  . Anemia 2007   due to fibroids  . Duodenitis without hemorrhage   . Esophageal stricture   . Esophagitis   . GERD (gastroesophageal reflux disease)   . Hiatal hernia   . Hyperlipidemia   . Hypertension   . Hypothyroidism   . Leiomyoma of uterus, unspecified    fibroids  . Pneumonia    aspiration pneumonia after balloon surgery for weight loss    Family History  Problem Relation Age of Onset  . Celiac disease Mother   . Osteoporosis Mother   . Heart disease Father   . Stroke Father   . Irritable bowel syndrome Sister   . Lung cancer Maternal Grandmother   . Breast cancer Paternal Grandmother   . Heart disease Sister   . Melanoma Sister   . Colon cancer Neg Hx   . Esophageal cancer Neg Hx   . Stomach cancer Neg Hx   . Rectal cancer Neg Hx     Past Surgical History:  Procedure Laterality Date  . BICEPT TENODESIS Right 12/07/2019   Procedure: BICEPS TENODESIS;  Surgeon: Meredith Pel, MD;  Location: [place];  Service: Orthopedics;  Laterality: Right;  . CARPAL TUNNEL RELEASE  2014   right wrist  . CHOLECYSTECTOMY    . HYSTEROSCOPY W/ ENDOMETRIAL ABLATION  4/05  . RECTOCELE REPAIR  2007  . SHOULDER ARTHROSCOPY WITH OPEN ROTATOR CUFF REPAIR Right 12/07/2019   Procedure: RIGHT SHOULDER ARTHROSCOPY,   MINI OPEN SUBSCAPULARIS, AND SUPRASPINATUS REPAIR, OPEN BICEPS TENODESIS;  Surgeon: Meredith Pel, MD;  Location: [place];  Service: Orthopedics;  Laterality: Right;  . TONSILLECTOMY     as child  . TOTAL VAGINAL HYSTERECTOMY  5/07  . TUBAL LIGATION  1991   BILATERAL   Social History   Occupational History  . Occupation: Nurse  Tobacco Use  . Smoking status: Former Smoker    Quit date: 12/23/2002    Years since quitting: 17.1  . Smokeless tobacco: Never Used  Substance and Sexual Activity  . Alcohol use: Yes    Comment: rare  . Drug use: No  . Sexual activity: Yes    Partners: Male     Birth control/protection: Surgical    Comment: [facility]

## 2020-03-06 NOTE — Progress Notes (Signed)
59 y.o. [id number] Married White or Caucasian female here for annual exam.  Had major shoulder surgery 11/2019 with Dr. Marlou Sa.  Had rotator cuff tear x 2 and biceps tears.  She is back at work.  She did complete PT.  She is still doing home exercises.  Denies vaginal bleeding.  Having a little post void leakage and she cannot let her bladder get too full.  Had lab work last year.  Had virtual visit after the blood work.  Reports blood work was ok.  Was started on chlorthalidone.    Denies vaginal bleeding.  Doing well with current premarin dosage.   Patient's last menstrual period was 11/25/2005.          Sexually active: Yes.    The current method of family planning is status post hysterectomy.    Exercising: No.  exercise Smoker:  no  Health Maintenance: Pap:  04-10-17 neg HPV HR neg History of abnormal Pap:  yes MMG:  09-10-2019 category a density birads 1:neg Colonoscopy:  10-10-17 normal f/u 27yrs BMD:   Done by pcp (with Dr. Joylene Draft) TDaP:  2016 Pneumonia vaccine(s):  2017 Shingrix:   2018 Hep C testing: pcp Screening Labs: done with Dr. Joylene Draft   reports that she quit smoking about 17 years ago. She has never used smokeless tobacco. She reports previous alcohol use. She reports that she does not use drugs.  Past Medical History:  Diagnosis Date  . Abnormal Pap smear of cervix   . Anemia 2007   due to fibroids  . Duodenitis without hemorrhage   . Esophageal stricture   . Esophagitis   . GERD (gastroesophageal reflux disease)   . Hiatal hernia   . Hyperlipidemia   . Hypertension   . Hypothyroidism   . Leiomyoma of uterus, unspecified    fibroids  . Pneumonia    aspiration pneumonia after balloon surgery for weight loss    Past Surgical History:  Procedure Laterality Date  . BICEPT TENODESIS Right 12/07/2019   Procedure: BICEPS TENODESIS;  Surgeon: Meredith Pel, MD;  Location: [place];  Service: Orthopedics;  Laterality: Right;  . CARPAL TUNNEL  RELEASE  2014   right wrist  . CHOLECYSTECTOMY    . HYSTEROSCOPY W/ ENDOMETRIAL ABLATION  4/05  . RECTOCELE REPAIR  2007  . SHOULDER ARTHROSCOPY WITH OPEN ROTATOR CUFF REPAIR Right 12/07/2019   Procedure: RIGHT SHOULDER ARTHROSCOPY,   MINI OPEN SUBSCAPULARIS, AND SUPRASPINATUS REPAIR, OPEN BICEPS TENODESIS;  Surgeon: Meredith Pel, MD;  Location: [place];  Service: Orthopedics;  Laterality: Right;  . TONSILLECTOMY     as child  . TOTAL VAGINAL HYSTERECTOMY  5/07  . TUBAL LIGATION  1991   BILATERAL    Current Outpatient Medications  Medication Sig Dispense Refill  . acyclovir (ZOVIRAX) 400 MG tablet Take 1 tablet (400 mg total) by mouth 2 (two) times daily. 180 tablet 4  . chlorthalidone (HYGROTON) 25 MG tablet Take 25 mg by mouth daily.    . Cholecalciferol (VITAMIN D) 2000 units tablet Take 2,000 Units by mouth 2 (two) times daily.    . DULoxetine (CYMBALTA) 30 MG capsule Take 30 mg by mouth daily.    Marland Kitchen estrogens, conjugated, (PREMARIN) 0.625 MG tablet TAKE 1 TABLET (0.625 MG TOTAL) BY MOUTH DAILY. 90 tablet 4  . levothyroxine (SYNTHROID, LEVOTHROID) 75 MCG tablet Take 37.5-75 mcg by mouth See admin instructions. 75 mcg on all days except Tuesday patient takes 37.5 mcg ( 1/2 of  75 mcg tablet)    . losartan (COZAAR) 100 MG tablet Take 100 mg by mouth daily.    . Melatonin 5 MG CAPS Take 5 mg by mouth at bedtime.    . Multiple Vitamin (MULTIVITAMIN) capsule Take 1 capsule by mouth daily.    Marland Kitchen omeprazole (PRILOSEC) 40 MG capsule Take 1 capsule (40 mg total) by mouth 2 (two) times daily. 180 capsule 3  . pramipexole (MIRAPEX) 0.125 MG tablet Take 1 tablet by mouth at bedtime.  3  . rosuvastatin (CRESTOR) 10 MG tablet Take 10 mg by mouth daily.    . vitamin B-12 (CYANOCOBALAMIN) 100 MCG tablet Take 100 mcg by mouth daily.     No current facility-administered medications for this visit.    Family History  Problem Relation Age of Onset  . Celiac disease Mother   .  Osteoporosis Mother   . Heart disease Father   . Stroke Father   . Irritable bowel syndrome Sister   . Lung cancer Maternal Grandmother   . Breast cancer Paternal Grandmother   . Heart disease Sister   . Melanoma Sister   . Colon cancer Neg Hx   . Esophageal cancer Neg Hx   . Stomach cancer Neg Hx   . Rectal cancer Neg Hx     Review of Systems  Constitutional: Negative.   HENT: Negative.   Eyes: Negative.   Respiratory: Negative.   Cardiovascular: Negative.   Gastrointestinal: Negative.   Endocrine: Negative.   Genitourinary: Negative.   Musculoskeletal: Negative.   Skin: Negative.   Allergic/Immunologic: Negative.   Neurological: Negative.   Psychiatric/Behavioral: Negative.     Exam:   BP 118/74   Pulse 68   Temp (!) 97.2 F (36.2 C) (Skin)   Resp 16   Ht 5' 5.75" (1.67 m)   Wt 200 lb (90.7 kg)   LMP 11/25/2005   BMI 32.53 kg/m     Height: 5' 5.75" (167 cm)  Ht Readings from Last 3 Encounters:  03/09/20 5' 5.75" (1.67 m)  02/23/20 5\' 5"  (1.651 m)  12/15/19 5\' 5"  (1.651 m)    General appearance: alert, cooperative and appears stated age Head: Normocephalic, without obvious abnormality, atraumatic Neck: no adenopathy, supple, symmetrical, trachea midline and thyroid normal to inspection and palpation Lungs: clear to auscultation bilaterally Breasts: normal appearance, no masses or tenderness Heart: regular rate and rhythm Abdomen: soft, non-tender; bowel sounds normal; no masses,  no organomegaly Extremities: extremities normal, atraumatic, no cyanosis or edema Skin: Skin color, texture, turgor normal. No rashes or lesions Lymph nodes: Cervical, supraclavicular, and axillary nodes normal. No abnormal inguinal nodes palpated Neurologic: Grossly normal   Pelvic: External genitalia:  no lesions              Urethra:  normal appearing urethra with no masses, tenderness or lesions              Bartholins and Skenes: normal                 Vagina: normal  appearing vagina with normal color and discharge, no lesions, 3rd degree cystocele              Cervix: absent              Pap taken: No. Bimanual Exam:  Uterus:  uterus absent              Adnexa: normal adnexa  Rectovaginal: Confirms               Anus:  normal sphincter tone, no lesions  Chaperone, Danie Binder, CMA, was present for exam.  A:  Well Woman with normal exam PMP, on HRT H/o TVH Hypothyroidism GERD Fibromyalgia H/o HSV Cystocele H/o aspiration pneumonia, has received both pneumonia vaccines  P:   Mammogram guidelines reviewed.  Up to date. pap smear not indicated.  Did have neg HR HPV 2018 Acyclovir 400mg  bid for HSV prevention.  #180/4RF RF for premarin 0.625mg  daily.  #90/4RF Colonoscopy is UTD Return annually or prn

## 2020-03-08 ENCOUNTER — Other Ambulatory Visit: Payer: Self-pay

## 2020-03-09 ENCOUNTER — Other Ambulatory Visit: Payer: Self-pay | Admitting: Obstetrics & Gynecology

## 2020-03-09 ENCOUNTER — Other Ambulatory Visit: Payer: Self-pay

## 2020-03-09 ENCOUNTER — Encounter: Payer: Self-pay | Admitting: Obstetrics & Gynecology

## 2020-03-09 ENCOUNTER — Ambulatory Visit (INDEPENDENT_AMBULATORY_CARE_PROVIDER_SITE_OTHER): Payer: 59 | Admitting: Obstetrics & Gynecology

## 2020-03-09 VITALS — BP 118/74 | HR 68 | Temp 97.2°F | Resp 16 | Ht 65.75 in | Wt 200.0 lb

## 2020-03-09 DIAGNOSIS — Z01419 Encounter for gynecological examination (general) (routine) without abnormal findings: Secondary | ICD-10-CM

## 2020-03-09 MED ORDER — ACYCLOVIR 400 MG PO TABS: 400.0000 mg | ORAL_TABLET | Freq: Two times a day (BID) | ORAL | 4 refills | Status: DC

## 2020-03-09 MED ORDER — ESTROGENS CONJUGATED 0.625 MG PO TABS: ORAL_TABLET | ORAL | 4 refills | Status: DC

## 2020-03-09 MED FILL — ACYCLOVIR 400 MG TABLET: 400 | 90 days supply | Qty: 180 | Fill #0

## 2020-04-10 ENCOUNTER — Other Ambulatory Visit (HOSPITAL_COMMUNITY): Payer: Self-pay | Admitting: Internal Medicine

## 2020-04-10 MED FILL — LOSARTAN POTASSIUM 100 MG T: 100 | 90 days supply | Qty: 90 | Fill #0

## 2020-04-10 MED FILL — DULoxetine HCL 30 MG CPEP: 30 | 90 days supply | Qty: 90 | Fill #0

## 2020-04-10 MED FILL — ROSUVASTATIN CALCIUM 10 MG: 10 | 90 days supply | Qty: 90 | Fill #1

## 2020-04-17 ENCOUNTER — Other Ambulatory Visit (HOSPITAL_COMMUNITY): Payer: Self-pay | Admitting: Internal Medicine

## 2020-04-17 MED FILL — CHLORTHALIDONE 25 MG TABS: 25 | 90 days supply | Qty: 90 | Fill #0

## 2020-04-27 ENCOUNTER — Other Ambulatory Visit (HOSPITAL_COMMUNITY): Payer: Self-pay | Admitting: Internal Medicine

## 2020-04-27 MED FILL — LEVOTHYROXINE SODIUM 75 MCG: 75 | 90 days supply | Qty: 90 | Fill #0

## 2020-05-05 ENCOUNTER — Other Ambulatory Visit (HOSPITAL_COMMUNITY): Payer: Self-pay | Admitting: Internal Medicine

## 2020-05-08 ENCOUNTER — Other Ambulatory Visit (HOSPITAL_COMMUNITY): Payer: Self-pay | Admitting: Internal Medicine

## 2020-05-08 MED FILL — PREMARIN 0.625 MG TABLET: 0.625 | 90 days supply | Qty: 90 | Fill #0

## 2020-05-09 MED FILL — OMEPRAZOLE 40 MG CPDR: 40 | 90 days supply | Qty: 180 | Fill #0

## 2020-05-31 DIAGNOSIS — Z0000 Encounter for general adult medical examination without abnormal findings: Secondary | ICD-10-CM | POA: Diagnosis not present

## 2020-05-31 DIAGNOSIS — I10 Essential (primary) hypertension: Secondary | ICD-10-CM | POA: Diagnosis not present

## 2020-05-31 DIAGNOSIS — E7849 Other hyperlipidemia: Secondary | ICD-10-CM | POA: Diagnosis not present

## 2020-06-07 ENCOUNTER — Other Ambulatory Visit (HOSPITAL_COMMUNITY): Payer: Self-pay | Admitting: Internal Medicine

## 2020-06-07 DIAGNOSIS — M5380 Other specified dorsopathies, site unspecified: Secondary | ICD-10-CM | POA: Diagnosis not present

## 2020-06-07 DIAGNOSIS — R82998 Other abnormal findings in urine: Secondary | ICD-10-CM | POA: Diagnosis not present

## 2020-06-07 DIAGNOSIS — Z0000 Encounter for general adult medical examination without abnormal findings: Secondary | ICD-10-CM | POA: Diagnosis not present

## 2020-06-07 DIAGNOSIS — R7301 Impaired fasting glucose: Secondary | ICD-10-CM | POA: Diagnosis not present

## 2020-06-07 DIAGNOSIS — E785 Hyperlipidemia, unspecified: Secondary | ICD-10-CM | POA: Diagnosis not present

## 2020-06-07 DIAGNOSIS — E669 Obesity, unspecified: Secondary | ICD-10-CM | POA: Diagnosis not present

## 2020-06-07 DIAGNOSIS — K219 Gastro-esophageal reflux disease without esophagitis: Secondary | ICD-10-CM | POA: Diagnosis not present

## 2020-06-07 DIAGNOSIS — Z1331 Encounter for screening for depression: Secondary | ICD-10-CM | POA: Diagnosis not present

## 2020-06-07 DIAGNOSIS — G43909 Migraine, unspecified, not intractable, without status migrainosus: Secondary | ICD-10-CM | POA: Diagnosis not present

## 2020-06-07 DIAGNOSIS — I10 Essential (primary) hypertension: Secondary | ICD-10-CM | POA: Diagnosis not present

## 2020-06-07 DIAGNOSIS — G5600 Carpal tunnel syndrome, unspecified upper limb: Secondary | ICD-10-CM | POA: Diagnosis not present

## 2020-06-07 DIAGNOSIS — M4800 Spinal stenosis, site unspecified: Secondary | ICD-10-CM | POA: Diagnosis not present

## 2020-06-15 MED FILL — AIMOVIG 140 MG/ML SOAJ: 140 | 30 days supply | Qty: 1 | Fill #0

## 2020-06-21 ENCOUNTER — Emergency Department (HOSPITAL_COMMUNITY): Payer: 59

## 2020-06-21 ENCOUNTER — Inpatient Hospital Stay (HOSPITAL_COMMUNITY): Payer: 59

## 2020-06-21 ENCOUNTER — Encounter (HOSPITAL_COMMUNITY): Payer: Self-pay | Admitting: Emergency Medicine

## 2020-06-21 ENCOUNTER — Inpatient Hospital Stay (HOSPITAL_COMMUNITY)
Admission: EM | Admit: 2020-06-21 | Discharge: 2020-06-23 | DRG: 042 | Disposition: A | Discharge status: Home / Self Care | Payer: 59 | Attending: Neurology | Admitting: Neurology

## 2020-06-21 DIAGNOSIS — E785 Hyperlipidemia, unspecified: Secondary | ICD-10-CM | POA: Diagnosis not present

## 2020-06-21 DIAGNOSIS — G9389 Other specified disorders of brain: Secondary | ICD-10-CM | POA: Diagnosis not present

## 2020-06-21 DIAGNOSIS — R200 Anesthesia of skin: Secondary | ICD-10-CM | POA: Diagnosis present

## 2020-06-21 DIAGNOSIS — Z9049 Acquired absence of other specified parts of digestive tract: Secondary | ICD-10-CM

## 2020-06-21 DIAGNOSIS — I10 Essential (primary) hypertension: Secondary | ICD-10-CM | POA: Diagnosis present

## 2020-06-21 DIAGNOSIS — I639 Cerebral infarction, unspecified: Secondary | ICD-10-CM

## 2020-06-21 DIAGNOSIS — Z20822 Contact with and (suspected) exposure to covid-19: Secondary | ICD-10-CM | POA: Diagnosis present

## 2020-06-21 DIAGNOSIS — M797 Fibromyalgia: Secondary | ICD-10-CM | POA: Diagnosis present

## 2020-06-21 DIAGNOSIS — I4510 Unspecified right bundle-branch block: Secondary | ICD-10-CM | POA: Diagnosis present

## 2020-06-21 DIAGNOSIS — I63411 Cerebral infarction due to embolism of right middle cerebral artery: Secondary | ICD-10-CM | POA: Diagnosis not present

## 2020-06-21 DIAGNOSIS — Z9282 Status post administration of tPA (rtPA) in a different facility within the last 24 hours prior to admission to current facility: Secondary | ICD-10-CM | POA: Diagnosis not present

## 2020-06-21 DIAGNOSIS — G4700 Insomnia, unspecified: Secondary | ICD-10-CM | POA: Diagnosis present

## 2020-06-21 DIAGNOSIS — E876 Hypokalemia: Secondary | ICD-10-CM | POA: Diagnosis present

## 2020-06-21 DIAGNOSIS — R7303 Prediabetes: Secondary | ICD-10-CM | POA: Diagnosis not present

## 2020-06-21 DIAGNOSIS — Z87891 Personal history of nicotine dependence: Secondary | ICD-10-CM

## 2020-06-21 DIAGNOSIS — R29818 Other symptoms and signs involving the nervous system: Secondary | ICD-10-CM | POA: Diagnosis not present

## 2020-06-21 DIAGNOSIS — E669 Obesity, unspecified: Secondary | ICD-10-CM | POA: Diagnosis present

## 2020-06-21 DIAGNOSIS — I081 Rheumatic disorders of both mitral and tricuspid valves: Secondary | ICD-10-CM | POA: Diagnosis present

## 2020-06-21 DIAGNOSIS — Z823 Family history of stroke: Secondary | ICD-10-CM | POA: Diagnosis not present

## 2020-06-21 DIAGNOSIS — G8324 Monoplegia of upper limb affecting left nondominant side: Secondary | ICD-10-CM | POA: Diagnosis present

## 2020-06-21 DIAGNOSIS — E039 Hypothyroidism, unspecified: Secondary | ICD-10-CM | POA: Diagnosis not present

## 2020-06-21 DIAGNOSIS — R29701 NIHSS score 1: Secondary | ICD-10-CM | POA: Diagnosis present

## 2020-06-21 DIAGNOSIS — Z79890 Hormone replacement therapy (postmenopausal): Secondary | ICD-10-CM

## 2020-06-21 DIAGNOSIS — Z90710 Acquired absence of both cervix and uterus: Secondary | ICD-10-CM | POA: Diagnosis not present

## 2020-06-21 DIAGNOSIS — Z6834 Body mass index (BMI) 34.0-34.9, adult: Secondary | ICD-10-CM | POA: Diagnosis not present

## 2020-06-21 DIAGNOSIS — G2581 Restless legs syndrome: Secondary | ICD-10-CM | POA: Diagnosis present

## 2020-06-21 DIAGNOSIS — I6330 Cerebral infarction due to thrombosis of unspecified cerebral artery: Secondary | ICD-10-CM | POA: Diagnosis not present

## 2020-06-21 DIAGNOSIS — K219 Gastro-esophageal reflux disease without esophagitis: Secondary | ICD-10-CM | POA: Diagnosis present

## 2020-06-21 DIAGNOSIS — Z8673 Personal history of transient ischemic attack (TIA), and cerebral infarction without residual deficits: Secondary | ICD-10-CM

## 2020-06-21 DIAGNOSIS — R9082 White matter disease, unspecified: Secondary | ICD-10-CM | POA: Diagnosis not present

## 2020-06-21 DIAGNOSIS — I6521 Occlusion and stenosis of right carotid artery: Secondary | ICD-10-CM | POA: Diagnosis not present

## 2020-06-21 DIAGNOSIS — Z79899 Other long term (current) drug therapy: Secondary | ICD-10-CM | POA: Diagnosis not present

## 2020-06-21 DIAGNOSIS — I340 Nonrheumatic mitral (valve) insufficiency: Secondary | ICD-10-CM | POA: Diagnosis not present

## 2020-06-21 DIAGNOSIS — I6389 Other cerebral infarction: Secondary | ICD-10-CM | POA: Diagnosis not present

## 2020-06-21 DIAGNOSIS — I6340 Cerebral infarction due to embolism of unspecified cerebral artery: Secondary | ICD-10-CM | POA: Diagnosis present

## 2020-06-21 DIAGNOSIS — I6782 Cerebral ischemia: Secondary | ICD-10-CM | POA: Diagnosis not present

## 2020-06-21 DIAGNOSIS — E7800 Pure hypercholesterolemia, unspecified: Secondary | ICD-10-CM | POA: Diagnosis not present

## 2020-06-21 DIAGNOSIS — D638 Anemia in other chronic diseases classified elsewhere: Secondary | ICD-10-CM | POA: Diagnosis not present

## 2020-06-21 HISTORY — DX: Cerebral infarction, unspecified: I63.9

## 2020-06-21 LAB — COMPREHENSIVE METABOLIC PANEL
ALT: 23 U/L (ref 0–44)
AST: 24 U/L (ref 15–41)
Albumin: 3.5 g/dL (ref 3.5–5.0)
Alkaline Phosphatase: 44 U/L (ref 38–126)
Anion gap: 10 (ref 5–15)
BUN: 14 mg/dL (ref 6–20)
CO2: 27 mmol/L (ref 22–32)
Calcium: 9.3 mg/dL (ref 8.9–10.3)
Chloride: 101 mmol/L (ref 98–111)
Creatinine, Ser: 1.01 mg/dL — ABNORMAL HIGH (ref 0.44–1.00)
GFR calc Af Amer: >60 mL/min (ref >60)
GFR calc non Af Amer: >60 mL/min (ref >60)
Glucose, Bld: 155 mg/dL — ABNORMAL HIGH (ref 70–99)
Potassium: 2.9 mmol/L — ABNORMAL LOW (ref 3.5–5.1)
Sodium: 138 mmol/L (ref 135–145)
Total Bilirubin: 0.2 mg/dL — ABNORMAL LOW (ref 0.3–1.2)
Total Protein: 6.7 g/dL (ref 6.5–8.1)

## 2020-06-21 LAB — CBC
HCT: 40.7 % (ref 36.0–46.0)
Hemoglobin: 12.6 g/dL (ref 12.0–15.0)
MCH: 26.8 pg (ref 26.0–34.0)
MCHC: 31 g/dL (ref 30.0–36.0)
MCV: 86.6 fL (ref 80.0–100.0)
Platelets: 308 10*3/uL (ref 150–400)
RBC: 4.7 MIL/uL (ref 3.87–5.11)
RDW: 15 % (ref 11.5–15.5)
WBC: 7 10*3/uL (ref 4.0–10.5)
nRBC: 0 % (ref 0.0–0.2)

## 2020-06-21 LAB — I-STAT CHEM 8, ED
BUN: 15 mg/dL (ref 6–20)
Calcium, Ion: 1.17 mmol/L (ref 1.15–1.40)
Chloride: 101 mmol/L (ref 98–111)
Creatinine, Ser: 0.9 mg/dL (ref 0.44–1.00)
Glucose, Bld: 151 mg/dL — ABNORMAL HIGH (ref 70–99)
HCT: 41 % (ref 36.0–46.0)
Hemoglobin: 13.9 g/dL (ref 12.0–15.0)
Potassium: 2.9 mmol/L — ABNORMAL LOW (ref 3.5–5.1)
Sodium: 141 mmol/L (ref 135–145)
TCO2: 26 mmol/L (ref 22–32)

## 2020-06-21 LAB — DIFFERENTIAL
Abs Immature Granulocytes: 0.02 10*3/uL (ref 0.00–0.07)
Basophils Absolute: 0.1 10*3/uL (ref 0.0–0.1)
Basophils Relative: 1 %
Eosinophils Absolute: 0.2 10*3/uL (ref 0.0–0.5)
Eosinophils Relative: 3 %
Immature Granulocytes: 0 %
Lymphocytes Relative: 38 %
Lymphs Abs: 2.7 10*3/uL (ref 0.7–4.0)
Monocytes Absolute: 0.5 10*3/uL (ref 0.1–1.0)
Monocytes Relative: 8 %
Neutro Abs: 3.6 10*3/uL (ref 1.7–7.7)
Neutrophils Relative %: 50 %

## 2020-06-21 LAB — I-STAT BETA HCG BLOOD, ED (MC, WL, AP ONLY): I-stat hCG, quantitative: <5 m[IU]/mL (ref <5)

## 2020-06-21 LAB — CBG MONITORING, ED: Glucose-Capillary: 158 mg/dL — ABNORMAL HIGH (ref 70–99)

## 2020-06-21 LAB — ETHANOL: Alcohol, Ethyl (B): <10 mg/dL (ref <10)

## 2020-06-21 LAB — PROTIME-INR
INR: 0.9 (ref 0.8–1.2)
Prothrombin Time: 12.2 seconds (ref 11.4–15.2)

## 2020-06-21 LAB — APTT: aPTT: 26 seconds (ref 24–36)

## 2020-06-21 MED ORDER — STROKE: EARLY STAGES OF RECOVERY BOOK
Freq: Once | Status: AC
  Filled 2020-06-21: qty 1

## 2020-06-21 MED ORDER — SODIUM CHLORIDE 0.9 % IV SOLN
50.0000 mL | Freq: Once | INTRAVENOUS | Status: AC
  Administered 2020-06-21: 50 mL via INTRAVENOUS

## 2020-06-21 MED ORDER — ALTEPLASE (STROKE) FULL DOSE INFUSION
0.9000 mg/kg | Freq: Once | INTRAVENOUS | Status: AC
  Administered 2020-06-21: 83.6 mg via INTRAVENOUS
  Filled 2020-06-21: qty 100

## 2020-06-21 MED ORDER — PANTOPRAZOLE SODIUM 40 MG IV SOLR
40.0000 mg | Freq: Every day | INTRAVENOUS | Status: DC
  Administered 2020-06-21 – 2020-06-22 (×2): 40 mg via INTRAVENOUS
  Filled 2020-06-21 (×2): qty 40

## 2020-06-21 MED ORDER — ACETAMINOPHEN 650 MG RE SUPP: 650.0000 mg | RECTAL | Status: DC | PRN

## 2020-06-21 MED ORDER — SENNOSIDES-DOCUSATE SODIUM 8.6-50 MG PO TABS: 1.0000 | ORAL_TABLET | Freq: Every evening | ORAL | Status: DC | PRN

## 2020-06-21 MED ORDER — CLEVIDIPINE BUTYRATE 0.5 MG/ML IV EMUL: 0.0000 mg/h | INTRAVENOUS | Status: DC

## 2020-06-21 MED ORDER — ACETAMINOPHEN 160 MG/5ML PO SOLN: 650.0000 mg | ORAL | Status: DC | PRN

## 2020-06-21 MED ORDER — LABETALOL HCL 5 MG/ML IV SOLN: 20.0000 mg | Freq: Once | INTRAVENOUS | Status: DC

## 2020-06-21 MED ORDER — POTASSIUM CHLORIDE 10 MEQ/100ML IV SOLN
10.0000 meq | INTRAVENOUS | Status: AC
  Administered 2020-06-21 – 2020-06-22 (×4): 10 meq via INTRAVENOUS
  Filled 2020-06-21 (×4): qty 100

## 2020-06-21 MED ORDER — SODIUM CHLORIDE 0.9 % IV SOLN: INTRAVENOUS | Status: DC

## 2020-06-21 MED ORDER — ACETAMINOPHEN 325 MG PO TABS: 650.0000 mg | ORAL_TABLET | ORAL | Status: DC | PRN

## 2020-06-21 MED ORDER — IOHEXOL 350 MG/ML SOLN
50.0000 mL | Freq: Once | INTRAVENOUS | Status: AC | PRN
  Administered 2020-06-21: 50 mL via INTRAVENOUS

## 2020-06-21 MED ORDER — POTASSIUM CHLORIDE CRYS ER 20 MEQ PO TBCR
40.0000 meq | EXTENDED_RELEASE_TABLET | Freq: Once | ORAL | Status: AC
  Administered 2020-06-21: 40 meq via ORAL
  Filled 2020-06-21: qty 2

## 2020-06-21 NOTE — ED Provider Notes (Signed)
[facility] EMERGENCY DEPARTMENT Provider Note   CSN: [id number] Arrival date & time: 06/21/20  2133     History Chief Complaint  Patient presents with  . Weakness    [name] is a 59 y.o. female.  HPI This note needs to be deleted but cannot be deleted from epic.  Patient not seen by myself.  Patient seen by Dr. Billy Fischer, see her note for ED visit.    Past Medical History:  Diagnosis Date  . Abnormal Pap smear of cervix   . Anemia 2007   due to fibroids  . Duodenitis without hemorrhage   . Esophageal stricture   . Esophagitis   . GERD (gastroesophageal reflux disease)   . Hiatal hernia   . Hyperlipidemia   . Hypertension   . Hypothyroidism   . Leiomyoma of uterus, unspecified    fibroids  . Pneumonia    aspiration pneumonia after balloon surgery for weight loss    Patient Active Problem List   Diagnosis Date Noted  . Morbid obesity ([place]) 09/16/2012  . HYPERLIPIDEMIA 09/06/2008  . FIBROMYALGIA 09/06/2008  . HEADACHE, CHRONIC 09/06/2008  . FIBROIDS, UTERUS 09/01/2008  . HYPOTHYROIDISM 09/01/2008  . GERD 09/01/2008    Past Surgical History:  Procedure Laterality Date  . BICEPT TENODESIS Right 12/07/2019   Procedure: BICEPS TENODESIS;  Surgeon: Meredith Pel, MD;  Location: [place];  Service: Orthopedics;  Laterality: Right;  . CARPAL TUNNEL RELEASE  2014   right wrist  . CHOLECYSTECTOMY    . HYSTEROSCOPY W/ ENDOMETRIAL ABLATION  4/05  . RECTOCELE REPAIR  2007  . SHOULDER ARTHROSCOPY WITH OPEN ROTATOR CUFF REPAIR Right 12/07/2019   Procedure: RIGHT SHOULDER ARTHROSCOPY,   MINI OPEN SUBSCAPULARIS, AND SUPRASPINATUS REPAIR, OPEN BICEPS TENODESIS;  Surgeon: Meredith Pel, MD;  Location: [place];  Service: Orthopedics;  Laterality: Right;  . TONSILLECTOMY     as child  . TOTAL VAGINAL HYSTERECTOMY  5/07  . TUBAL LIGATION  1991   BILATERAL     OB History    Gravida  3   Para  3    Term  3   Preterm  0   AB  0   Living  3     SAB  0   TAB  0   Ectopic  0   Multiple  0   Live Births  3           Family History  Problem Relation Age of Onset  . Celiac disease Mother   . Osteoporosis Mother   . Heart disease Father   . Stroke Father   . Irritable bowel syndrome Sister   . Lung cancer Maternal Grandmother   . Breast cancer Paternal Grandmother   . Heart disease Sister   . Melanoma Sister   . Colon cancer Neg Hx   . Esophageal cancer Neg Hx   . Stomach cancer Neg Hx   . Rectal cancer Neg Hx     Social History   Tobacco Use  . Smoking status: Former Smoker    Quit date: 12/23/2002    Years since quitting: 17.5  . Smokeless tobacco: Never Used  Vaping Use  . Vaping Use: Never used  Substance Use Topics  . Alcohol use: Not Currently  . Drug use: No    Home Medications Prior to Admission medications   Medication Sig Start Date End Date Taking? Authorizing Provider  acyclovir (ZOVIRAX) 400 MG tablet Take  1 tablet (400 mg total) by mouth 2 (two) times daily. 03/09/20   Megan Salon, MD  chlorthalidone (HYGROTON) 25 MG tablet Take 25 mg by mouth daily.    [provider]  Cholecalciferol (VITAMIN D) 2000 units tablet Take 2,000 Units by mouth 2 (two) times daily.    [provider]  DULoxetine (CYMBALTA) 30 MG capsule Take 30 mg by mouth daily.    [provider]  estrogens, conjugated, (PREMARIN) 0.625 MG tablet TAKE 1 TABLET (0.625 MG TOTAL) BY MOUTH DAILY. 03/09/20   Megan Salon, MD  levothyroxine (SYNTHROID, LEVOTHROID) 75 MCG tablet Take 37.5-75 mcg by mouth See admin instructions. 75 mcg on all days except Tuesday patient takes 37.5 mcg ( 1/2 of 75 mcg tablet)    [provider]  losartan (COZAAR) 100 MG tablet Take 100 mg by mouth daily.    [provider]  Melatonin 5 MG CAPS Take 5 mg by mouth at bedtime.    [provider]  Multiple Vitamin (MULTIVITAMIN) capsule Take 1  capsule by mouth daily.    [provider]  omeprazole (PRILOSEC) 40 MG capsule Take 1 capsule (40 mg total) by mouth 2 (two) times daily. 09/14/12   Irene Shipper, MD  pramipexole (MIRAPEX) 0.125 MG tablet Take 1 tablet by mouth at bedtime. 06/16/18   [provider]  rosuvastatin (CRESTOR) 10 MG tablet Take 10 mg by mouth daily.    [provider]  vitamin B-12 (CYANOCOBALAMIN) 100 MCG tablet Take 100 mcg by mouth daily.    [provider]    Allergies    Patient has no known allergies.  Review of Systems   Review of Systems  Physical Exam Updated Vital Signs LMP 11/25/2005   Physical Exam  ED Results / Procedures / Treatments   Labs (all labs ordered are listed, but only abnormal results are displayed) Labs Reviewed  ETHANOL  PROTIME-INR  APTT  CBC  DIFFERENTIAL  COMPREHENSIVE METABOLIC PANEL  RAPID URINE DRUG SCREEN, HOSP PERFORMED  URINALYSIS, ROUTINE W REFLEX MICROSCOPIC  I-STAT CHEM 8, ED  I-STAT BETA HCG BLOOD, ED ([facility], [facility], [facility] ONLY)    EKG EKG Interpretation  Date/Time:  Wednesday June 21 2020 21:45:14 EDT Ventricular Rate:  95 PR Interval:  178 QRS Duration: 154 QT Interval:  410 QTC Calculation: 515 R Axis:   52 Text Interpretation: Normal sinus rhythm Right bundle branch block Abnormal ECG old RBBB, no sig change from previous Confirmed by Charlesetta Shanks [phone]) on 06/21/2020 9:52:53 PM   Radiology No results found.  Procedures Procedures (including critical care time)  Medications Ordered in ED Medications - No data to display  ED Course  I have reviewed the triage vital signs and the nursing notes.  Pertinent labs & imaging results that were available during my care of the patient were reviewed by me and considered in my medical decision making (see chart for details).    MDM Rules/Calculators/A&P                           Final Clinical Impression(s) / ED Diagnoses Final diagnoses:  None    Rx / DC  Orders ED Discharge Orders    None       Charlesetta Shanks, MD 06/25/20 1141

## 2020-06-21 NOTE — ED Provider Notes (Addendum)
[facility] EMERGENCY DEPARTMENT Provider Note   CSN: [id number] Arrival date & time: 06/21/20  2133     History Chief Complaint  Patient presents with  . Weakness    [name] is a 59 y.o. female.  HPI     59yo female with history of hypertension, hyperlipidemia, pre-diabetes presents with concern for left arm numbness and weakness beginning around 730PM.  Reports she was in normal state of health when she suddenly developed numbness and weakness. Had difficulty holding phone, remote. Weakness to left hand and feeling of numbness. Was trying to shake it out. Was home alone, hoping it would get better. No headache, no leg numbness/weakness. Denies difficulty talking or walking, visual changes or facial droop.  No headache.    Past Medical History:  Diagnosis Date  . Abnormal Pap smear of cervix   . Anemia 2007   due to fibroids  . Duodenitis without hemorrhage   . Esophageal stricture   . Esophagitis   . GERD (gastroesophageal reflux disease)   . Hiatal hernia   . Hyperlipidemia   . Hypertension   . Hypothyroidism   . Leiomyoma of uterus, unspecified    fibroids  . Pneumonia    aspiration pneumonia after balloon surgery for weight loss    Patient Active Problem List   Diagnosis Date Noted  . Cerebral thrombosis with cerebral infarction 06/21/2020  . Acute ischemic stroke ([place]) 06/21/2020  . Morbid obesity ([place]) 09/16/2012  . HYPERLIPIDEMIA 09/06/2008  . FIBROMYALGIA 09/06/2008  . HEADACHE, CHRONIC 09/06/2008  . FIBROIDS, UTERUS 09/01/2008  . HYPOTHYROIDISM 09/01/2008  . GERD 09/01/2008    Past Surgical History:  Procedure Laterality Date  . BICEPT TENODESIS Right 12/07/2019   Procedure: BICEPS TENODESIS;  Surgeon: Meredith Pel, MD;  Location: [place];  Service: Orthopedics;  Laterality: Right;  . CARPAL TUNNEL RELEASE  2014   right wrist  . CHOLECYSTECTOMY    . HYSTEROSCOPY W/ ENDOMETRIAL ABLATION  4/05  .  RECTOCELE REPAIR  2007  . SHOULDER ARTHROSCOPY WITH OPEN ROTATOR CUFF REPAIR Right 12/07/2019   Procedure: RIGHT SHOULDER ARTHROSCOPY,   MINI OPEN SUBSCAPULARIS, AND SUPRASPINATUS REPAIR, OPEN BICEPS TENODESIS;  Surgeon: Meredith Pel, MD;  Location: [place];  Service: Orthopedics;  Laterality: Right;  . TONSILLECTOMY     as child  . TOTAL VAGINAL HYSTERECTOMY  5/07  . TUBAL LIGATION  1991   BILATERAL     OB History    Gravida  3   Para  3   Term  3   Preterm  0   AB  0   Living  3     SAB  0   TAB  0   Ectopic  0   Multiple  0   Live Births  3           Family History  Problem Relation Age of Onset  . Celiac disease Mother   . Osteoporosis Mother   . Heart disease Father   . Stroke Father   . Irritable bowel syndrome Sister   . Lung cancer Maternal Grandmother   . Breast cancer Paternal Grandmother   . Heart disease Sister   . Melanoma Sister   . Colon cancer Neg Hx   . Esophageal cancer Neg Hx   . Stomach cancer Neg Hx   . Rectal cancer Neg Hx     Social History   Tobacco Use  . Smoking status: Former Smoker  Quit date: 12/23/2002    Years since quitting: 17.5  . Smokeless tobacco: Never Used  Vaping Use  . Vaping Use: Never used  Substance Use Topics  . Alcohol use: Not Currently  . Drug use: No    Home Medications Prior to Admission medications   Medication Sig Start Date End Date Taking? Authorizing Provider  acyclovir (ZOVIRAX) 400 MG tablet Take 1 tablet (400 mg total) by mouth 2 (two) times daily. Patient taking differently: Take 400 mg by mouth 2 (two) times daily as needed (Fever blisters).  03/09/20  Yes Megan Salon, MD  AIMOVIG 140 MG/ML SOAJ Inject 140 mg into the skin every 30 (thirty) days. 06/15/20  Yes [provider]  chlorthalidone (HYGROTON) 25 MG tablet Take 25 mg by mouth daily.   Yes [provider]  Cholecalciferol (VITAMIN D) 2000 units tablet Take 2,000 Units by mouth 2  (two) times daily.   Yes [provider]  DULoxetine (CYMBALTA) 30 MG capsule Take 30 mg by mouth every evening.    Yes [provider]  estrogens, conjugated, (PREMARIN) 0.625 MG tablet TAKE 1 TABLET (0.625 MG TOTAL) BY MOUTH DAILY. Patient taking differently: Take 0.625 mg by mouth daily.  03/09/20  Yes Megan Salon, MD  ibuprofen (ADVIL) 200 MG tablet Take 600 mg by mouth daily as needed for moderate pain.   Yes [provider]  levothyroxine (SYNTHROID, LEVOTHROID) 75 MCG tablet Take 37.5-75 mcg by mouth See admin instructions. 75 mcg on all days except Tuesday patient takes 37.5 mcg ( 1/2 of 75 mcg tablet)   Yes [provider]  losartan (COZAAR) 100 MG tablet Take 100 mg by mouth every evening.    Yes [provider]  Melatonin 5 MG CAPS Take 5 mg by mouth at bedtime.   Yes [provider]  Multiple Vitamin (MULTIVITAMIN) capsule Take 1 capsule by mouth daily.   Yes [provider]  omeprazole (PRILOSEC) 40 MG capsule Take 1 capsule (40 mg total) by mouth 2 (two) times daily. 09/14/12  Yes Irene Shipper, MD  pramipexole (MIRAPEX) 0.125 MG tablet Take 1 tablet by mouth at bedtime. 06/16/18  Yes [provider]  rosuvastatin (CRESTOR) 10 MG tablet Take 10 mg by mouth every evening.    Yes [provider]  vitamin B-12 (CYANOCOBALAMIN) 100 MCG tablet Take 100 mcg by mouth daily.   Yes [provider]    Allergies    Patient has no known allergies.  Review of Systems   Review of Systems  Physical Exam Updated Vital Signs BP (!) 146/88   Pulse 88   Resp 18   Wt (!) 92.9 kg   LMP 11/25/2005   SpO2 96%   BMI 33.31 kg/m   Physical Exam Vitals and nursing note reviewed.  Constitutional:      General: She is not in acute distress.    Appearance: She is well-developed. She is not diaphoretic.  HENT:     Head: Normocephalic and atraumatic.  Eyes:     General: No visual field deficit.     Conjunctiva/sclera: Conjunctivae normal.  Cardiovascular:     Rate and Rhythm: Normal rate and regular rhythm.  Pulmonary:     Effort: Pulmonary effort is normal. No respiratory distress.  Musculoskeletal:        General: No tenderness.     Cervical back: Normal range of motion.  Skin:    General: Skin is warm and dry.  Findings: No erythema or rash.  Neurological:     Mental Status: She is alert and oriented to person, place, and time.     GCS: GCS eye subscore is 4. GCS verbal subscore is 5. GCS motor subscore is 6.     Cranial Nerves: Cranial nerves are intact. No cranial nerve deficit, dysarthria or facial asymmetry.     Sensory: Sensory deficit (left lower arm) present.     Motor: Weakness (left grip strength, distal left arm) present. Pronator drift: mild.     Coordination: Finger-nose-finger test: impaired by weakness on left.     Gait: Gait is intact.     ED Results / Procedures / Treatments   Labs (all labs ordered are listed, but only abnormal results are displayed) Labs Reviewed  COMPREHENSIVE METABOLIC PANEL - Abnormal; Notable for the following components:      Result Value   Potassium 2.9 (*)    Glucose, Bld 155 (*)    Creatinine, Ser 1.01 (*)    Total Bilirubin 0.2 (*)    All other components within normal limits  I-STAT CHEM 8, ED - Abnormal; Notable for the following components:   Potassium 2.9 (*)    Glucose, Bld 151 (*)    All other components within normal limits  CBG MONITORING, ED - Abnormal; Notable for the following components:   Glucose-Capillary 158 (*)    All other components within normal limits  SARS CORONAVIRUS 2 BY RT PCR (HOSPITAL ORDER, [facility])  ETHANOL  PROTIME-INR  APTT  CBC  DIFFERENTIAL  RAPID URINE DRUG SCREEN, HOSP PERFORMED  URINALYSIS, ROUTINE W REFLEX MICROSCOPIC  HIV ANTIBODY (ROUTINE TESTING W REFLEX)  HEMOGLOBIN A1C  LIPID PANEL  I-STAT BETA HCG BLOOD, ED ([facility], [facility], [facility] ONLY)    EKG EKG  Interpretation  Date/Time:  Wednesday June 21 2020 21:45:14 EDT Ventricular Rate:  95 PR Interval:  178 QRS Duration: 154 QT Interval:  410 QTC Calculation: 515 R Axis:   52 Text Interpretation: Normal sinus rhythm Right bundle branch block Abnormal ECG old RBBB, no sig change from previous Confirmed by Charlesetta Shanks [phone]) on 06/21/2020 9:52:53 PM   Radiology CT Code Stroke CTA Head W/WO contrast  Result Date: 06/21/2020 CLINICAL DATA:  59 year old female code stroke presentation. EXAM: CT ANGIOGRAPHY HEAD AND NECK TECHNIQUE: Multidetector CT imaging of the head and neck was performed using the standard protocol during bolus administration of intravenous contrast. Multiplanar CT image reconstructions and MIPs were obtained to evaluate the vascular anatomy. Carotid stenosis measurements (when applicable) are obtained utilizing NASCET criteria, using the distal internal carotid diameter as the denominator. CONTRAST:  108mL OMNIPAQUE IOHEXOL 350 MG/ML SOLN COMPARISON:  Head CT without contrast 2206 hours today. FINDINGS: CTA NECK Skeleton: Cervical spine disc and endplate degeneration. No acute osseous abnormality identified. Upper chest: Negative. Other neck: Motion artifact at the oropharynx.  Otherwise negative. Aortic arch: 3 vessel arch configuration.  No arch atherosclerosis. Right carotid system: Minimal calcified plaque at the right ICA origin with no stenosis. Left carotid system: Mildly tortuous left CCA.  Otherwise negative. Vertebral arteries: Normal proximal right subclavian artery and right vertebral artery origin. Non dominant right vertebral artery is patent to the skull base without plaque or stenosis. Normal proximal left subclavian artery. The proximal left vertebral artery is obscured by dense paravertebral venous contrast, but from the visible left V2 segment onward the vessel appears normal to the skull base. CTA HEAD Posterior circulation: Dominant left V4 segment.  Normal PICA  origins. No distal vertebral stenosis. The right vertebral is diminutive beyond PICA but patent to the vertebrobasilar junction. Mildly fenestrated proximal basilar artery (normal variant). Patent basilar artery without stenosis. Normal SCA origins. Fetal type bilateral PCA origins. Bilateral PCA branches are within normal limits. Both posterior communicating arteries are tortuous. Anterior circulation: Both ICA siphons are patent. Normal ophthalmic and posterior communicating artery origins. No siphon plaque or stenosis. Patent carotid termini, normal MCA and ACA origins. Diminutive or absent anterior communicating artery. Bilateral ACA branches are within normal limits. Left MCA M1 segment and bifurcation are patent without stenosis. Left MCA branches are within normal limits. Right MCA M1 segment and bifurcation are patent without stenosis. Right MCA branches are within normal limits. Venous sinuses: Patent. Anatomic variants: Dominant left vertebral artery. Fenestrated proximal basilar artery. Bilateral fetal PCA origins. Review of the MIP images confirms the above findings IMPRESSION: Negative for large vessel occlusion, and minimal atherosclerosis with no stenosis in the head or neck. These results were communicated to Dr. Rory Percy at 10:34 pm on 06/21/2020 by text page via the [facility] messaging system. Electronically Signed   By: Genevie Ann M.D.   On: 06/21/2020 22:35   CT Code Stroke CTA Neck W/WO contrast  Result Date: 06/21/2020 CLINICAL DATA:  59 year old female code stroke presentation. EXAM: CT ANGIOGRAPHY HEAD AND NECK TECHNIQUE: Multidetector CT imaging of the head and neck was performed using the standard protocol during bolus administration of intravenous contrast. Multiplanar CT image reconstructions and MIPs were obtained to evaluate the vascular anatomy. Carotid stenosis measurements (when applicable) are obtained utilizing NASCET criteria, using the distal internal carotid diameter as the  denominator. CONTRAST:  108mL OMNIPAQUE IOHEXOL 350 MG/ML SOLN COMPARISON:  Head CT without contrast 2206 hours today. FINDINGS: CTA NECK Skeleton: Cervical spine disc and endplate degeneration. No acute osseous abnormality identified. Upper chest: Negative. Other neck: Motion artifact at the oropharynx.  Otherwise negative. Aortic arch: 3 vessel arch configuration.  No arch atherosclerosis. Right carotid system: Minimal calcified plaque at the right ICA origin with no stenosis. Left carotid system: Mildly tortuous left CCA.  Otherwise negative. Vertebral arteries: Normal proximal right subclavian artery and right vertebral artery origin. Non dominant right vertebral artery is patent to the skull base without plaque or stenosis. Normal proximal left subclavian artery. The proximal left vertebral artery is obscured by dense paravertebral venous contrast, but from the visible left V2 segment onward the vessel appears normal to the skull base. CTA HEAD Posterior circulation: Dominant left V4 segment. Normal PICA origins. No distal vertebral stenosis. The right vertebral is diminutive beyond PICA but patent to the vertebrobasilar junction. Mildly fenestrated proximal basilar artery (normal variant). Patent basilar artery without stenosis. Normal SCA origins. Fetal type bilateral PCA origins. Bilateral PCA branches are within normal limits. Both posterior communicating arteries are tortuous. Anterior circulation: Both ICA siphons are patent. Normal ophthalmic and posterior communicating artery origins. No siphon plaque or stenosis. Patent carotid termini, normal MCA and ACA origins. Diminutive or absent anterior communicating artery. Bilateral ACA branches are within normal limits. Left MCA M1 segment and bifurcation are patent without stenosis. Left MCA branches are within normal limits. Right MCA M1 segment and bifurcation are patent without stenosis. Right MCA branches are within normal limits. Venous sinuses: Patent.  Anatomic variants: Dominant left vertebral artery. Fenestrated proximal basilar artery. Bilateral fetal PCA origins. Review of the MIP images confirms the above findings IMPRESSION: Negative for large vessel occlusion, and minimal atherosclerosis with no stenosis in  the head or neck. These results were communicated to Dr. Rory Percy at 10:34 pm on 06/21/2020 by text page via the [facility] messaging system. Electronically Signed   By: Genevie Ann M.D.   On: 06/21/2020 22:35   CT HEAD CODE STROKE WO CONTRAST  Result Date: 06/21/2020 CLINICAL DATA:  Code stroke. Initial evaluation for neuro deficit, stroke suspected. EXAM: CT HEAD WITHOUT CONTRAST TECHNIQUE: Contiguous axial images were obtained from the base of the skull through the vertex without intravenous contrast. COMPARISON:  Prior CT from 05/25/2005. FINDINGS: Brain: Cerebral volume within normal limits for patient age. No evidence for acute intracranial hemorrhage. No findings to suggest acute large vessel territory infarct. No mass lesion, midline shift, or mass effect. Ventricles are normal in size without evidence for hydrocephalus. No extra-axial fluid collection identified. Vascular: No hyperdense vessel identified. Skull: Scalp soft tissues demonstrate no acute abnormality. Calvarium intact. Sinuses/Orbits: Globes and orbital soft tissues within normal limits. Visualized paranasal sinuses are clear. No mastoid effusion. ASPECTS [facility] Stroke Program Early CT Score) - Ganglionic level infarction (caudate, lentiform nuclei, internal capsule, insula, M1-M3 cortex): 7 - Supraganglionic infarction (M4-M6 cortex): 3 Total score (0-10 with 10 being normal): 10 IMPRESSION: 1. Negative head CT.  No acute intracranial abnormality identified. 2. ASPECTS is 10. These results were communicated to Dr. Rory Percy at 10:13 [place] 7/28/2021by text page via the [facility] messaging system. Electronically Signed   By: Jeannine Boga M.D.   On: 06/21/2020 22:13     Procedures .Critical Care Performed by: Gareth Morgan, MD Authorized by: Gareth Morgan, MD   Critical care provider statement:    Critical care time (minutes):  45   Critical care was time spent personally by me on the following activities:  Discussions with consultants, examination of patient, ordering and performing treatments and interventions, ordering and review of laboratory studies, ordering and review of radiographic studies, pulse oximetry and obtaining history from patient or surrogate   (including critical care time)  Medications Ordered in ED Medications   stroke: mapping our early stages of recovery book (has no administration in time range)  0.9 %  sodium chloride infusion ( Intravenous Restarted 06/21/20 2302)  acetaminophen (TYLENOL) tablet 650 mg (has no administration in time range)    Or  acetaminophen (TYLENOL) 160 MG/5ML solution 650 mg (has no administration in time range)    Or  acetaminophen (TYLENOL) suppository 650 mg (has no administration in time range)  senna-docusate (Senokot-S) tablet 1 tablet (has no administration in time range)  pantoprazole (PROTONIX) injection 40 mg (40 mg Intravenous Given 06/21/20 2309)  labetalol (NORMODYNE) injection 20 mg (0 mg Intravenous Hold 06/21/20 2235)    And  clevidipine (CLEVIPREX) infusion 0.5 mg/mL (0 mg/hr Intravenous Hold 06/21/20 2236)  potassium chloride 10 mEq in 100 mL IVPB (10 mEq Intravenous New Bag/Given 06/21/20 2315)  alteplase (ACTIVASE) 1 mg/mL infusion 83.6 mg (0 mg Intravenous Stopped 06/21/20 2301)    Followed by  0.9 %  sodium chloride infusion (50 mLs Intravenous New Bag/Given 06/21/20 2301)  iohexol (OMNIPAQUE) 350 MG/ML injection 50 mL (50 mLs Intravenous Contrast Given 06/21/20 2224)  potassium chloride SA (KLOR-CON) CR tablet 40 mEq (40 mEq Oral Given 06/21/20 2308)    ED Course  I have reviewed the triage vital signs and the nursing notes.  Pertinent labs & imaging results that were  available during my care of the patient were reviewed by me and considered in my medical decision making (see chart for details).    MDM  Rules/Calculators/A&P                          59yo female with history of hypertension, hyperlipidemia, pre-diabetes presents with concern for left arm numbness and weakness beginning around 24PM.  Code Stroke initiated and Dr. Rory Percy came to bedside. No acute findings on ECG. Dr. Rory Percy discussed risks and benefits of tPA with patient who agrees to treatment.  Given tPA and will admit to Neuro ICU.   Final Clinical Impression(s) / ED Diagnoses Final diagnoses:  Cerebrovascular accident (CVA), unspecified mechanism ([place])    Rx / DC Orders ED Discharge Orders    None        Gareth Morgan, MD 06/22/20 0004

## 2020-06-21 NOTE — ED Triage Notes (Signed)
Pt states suddenly today at 7pm she fel her left hand was weak and feels unable to use it, pt is unable to pick up self phone with hand. Pt has no drift in this arm or leg, speech clear, steady gait. Pt is alert and ox4.

## 2020-06-21 NOTE — H&P (Signed)
Stroke neurology history and physical TPA CC: Left arm numbness and weakness  History is obtained from: Patient, chart  HPI: [name] is a 59 y.o. female past medical history of hypertension, hyperlipidemia, hypothyroidism, right shoulder rotator cuff injury status post repair, who is a Surveyor, quantity nursing at the hospital, last known well 7:30 PM when she went home after work and noticed sudden onset of numbness and weakness on the left hand. She reports that she was at work all day and was normal.  She went home around 530.  Around 7:30 PM she noted sudden onset of inability to use her left hand.  She could not lift her cell phone and felt clumsy with that hand.  It also felt numb.  She came to the ER via private vehicle.  She was evaluated in the ED and a code stroke was activated in the emergency room. My detailed examination as below. Denies any headaches.  Denies neck pain.  Denies any chest pain shortness of breath.  Denies visual symptoms.  Denies any speech difficulties or dysphagia. Denies any prior episodes similar to this in the past.    LKW: 7:30 PM on 06/21/2020 tpa given?:  Yes Premorbid modified Rankin scale (mRS):0 ROS: Performed and negative except as noted in HPI.  Past Medical History:  Diagnosis Date  . Abnormal Pap smear of cervix   . Anemia 2007   due to fibroids  . Duodenitis without hemorrhage   . Esophageal stricture   . Esophagitis   . GERD (gastroesophageal reflux disease)   . Hiatal hernia   . Hyperlipidemia   . Hypertension   . Hypothyroidism   . Leiomyoma of uterus, unspecified    fibroids  . Pneumonia    aspiration pneumonia after balloon surgery for weight loss    Family History  Problem Relation Age of Onset  . Celiac disease Mother   . Osteoporosis Mother   . Heart disease Father   . Stroke Father   . Irritable bowel syndrome Sister   . Lung cancer Maternal Grandmother   . Breast cancer Paternal Grandmother   . Heart disease  Sister   . Melanoma Sister   . Colon cancer Neg Hx   . Esophageal cancer Neg Hx   . Stomach cancer Neg Hx   . Rectal cancer Neg Hx     Social History:   reports that she quit smoking about 17 years ago. She has never used smokeless tobacco. She reports previous alcohol use. She reports that she does not use drugs.  Medications  Current Facility-Administered Medications:  .   stroke: mapping our early stages of recovery book, , Does not apply, Once, Amie Portland, MD .  0.9 %  sodium chloride infusion, , Intravenous, Continuous, Amie Portland, MD .  alteplase (ACTIVASE) 1 mg/mL infusion 83.6 mg, 0.9 mg/kg, Intravenous, Once, Last Rate: 75.2 mL/hr at 06/21/20 2210, 83.6 mg at 06/21/20 2210 **FOLLOWED BY** 0.9 %  sodium chloride infusion, 50 mL, Intravenous, Once, Amie Portland, MD .  acetaminophen (TYLENOL) tablet 650 mg, 650 mg, Oral, Q4H PRN **OR** acetaminophen (TYLENOL) 160 MG/5ML solution 650 mg, 650 mg, Per Tube, Q4H PRN **OR** acetaminophen (TYLENOL) suppository 650 mg, 650 mg, Rectal, Q4H PRN, Amie Portland, MD .  labetalol (NORMODYNE) injection 20 mg, 20 mg, Intravenous, Once **AND** clevidipine (CLEVIPREX) infusion 0.5 mg/mL, 0-21 mg/hr, Intravenous, Continuous, Amie Portland, MD .  pantoprazole (PROTONIX) injection 40 mg, 40 mg, Intravenous, QHS, Amie Portland, MD .  senna-docusate (Senokot-S) tablet  1 tablet, 1 tablet, Oral, QHS PRN, Amie Portland, MD  Current Outpatient Medications:  .  acyclovir (ZOVIRAX) 400 MG tablet, Take 1 tablet (400 mg total) by mouth 2 (two) times daily. (Patient taking differently: Take 400 mg by mouth 2 (two) times daily as needed (Fever blisters). ), Disp: 180 tablet, Rfl: 4 .  chlorthalidone (HYGROTON) 25 MG tablet, Take 25 mg by mouth daily., Disp: , Rfl:  .  Cholecalciferol (VITAMIN D) 2000 units tablet, Take 2,000 Units by mouth 2 (two) times daily., Disp: , Rfl:  .  DULoxetine (CYMBALTA) 30 MG capsule, Take 30 mg by mouth every evening. , Disp: ,  Rfl:  .  estrogens, conjugated, (PREMARIN) 0.625 MG tablet, TAKE 1 TABLET (0.625 MG TOTAL) BY MOUTH DAILY. (Patient taking differently: Take 0.625 mg by mouth daily. ), Disp: 90 tablet, Rfl: 4 .  levothyroxine (SYNTHROID, LEVOTHROID) 75 MCG tablet, Take 37.5-75 mcg by mouth See admin instructions. 75 mcg on all days except Tuesday patient takes 37.5 mcg ( 1/2 of 75 mcg tablet), Disp: , Rfl:  .  losartan (COZAAR) 100 MG tablet, Take 100 mg by mouth every evening. , Disp: , Rfl:  .  Melatonin 5 MG CAPS, Take 5 mg by mouth at bedtime., Disp: , Rfl:  .  Multiple Vitamin (MULTIVITAMIN) capsule, Take 1 capsule by mouth daily., Disp: , Rfl:  .  omeprazole (PRILOSEC) 40 MG capsule, Take 1 capsule (40 mg total) by mouth 2 (two) times daily., Disp: 180 capsule, Rfl: 3 .  pramipexole (MIRAPEX) 0.125 MG tablet, Take 1 tablet by mouth at bedtime., Disp: , Rfl: 3 .  rosuvastatin (CRESTOR) 10 MG tablet, Take 10 mg by mouth every evening. , Disp: , Rfl:  .  vitamin B-12 (CYANOCOBALAMIN) 100 MCG tablet, Take 100 mcg by mouth daily., Disp: , Rfl:   Exam: Current vital signs: BP (!) 153/90   Pulse 87   Resp 18   Wt (!) 92.9 kg   LMP 11/25/2005   SpO2 98%   BMI 33.31 kg/m  Vital signs in last 24 hours: Pulse Rate:  [87-88] 87 (07/28 2230) Resp:  [18] 18 (07/28 2230) BP: (151-153)/(90-99) 153/90 (07/28 2230) SpO2:  [97 %-98 %] 98 % (07/28 2230) Weight:  [92.9 kg] 92.9 kg (07/28 2200) GENERAL: Awake, alert in NAD, able to provide coherent history HEENT: - Normocephalic and atraumatic, dry mm, no LN++, no Thyromegally LUNGS - Clear to auscultation bilaterally with no wheezes CV - S1S2 RRR, no m/r/g, equal pulses bilaterally. ABDOMEN - Soft, nontender, nondistended with normoactive BS Ext: warm, well perfused, intact peripheral pulses, no edema  NEURO:  Mental Status: AA&Ox3 Language: speech is clear and not dysarthric.  Naming, repetition, fluency, and comprehension intact. Cranial Nerves: PERRL. EOMI,  visual fields full, no facial asymmetry, facial sensation intact, hearing intact, tongue/uvula/soft palate midline, normal sternocleidomastoid and trapezius muscle strength. No evidence of tongue atrophy or fibrillations Motor: Left upper extremity is 4+/5 proximally and 3/5 at the wrist and fingers.  Left lower extremity is 5/5.  Right upper and lower extremity are 5/5. Tone: is normal and bulk is normal Sensation-diminished light touch sensation on the left arm in comparison to the right.  No extinction. Coordination: FTN intact bilaterally, no ataxia in BLE. Gait- deferred  NIHSS-1 for sensory   Labs I have reviewed labs in epic and the results pertinent to this consultation are:  CBC    Component Value Date/Time   WBC 7.0 06/21/2020 2151   RBC 4.70 06/21/2020  2151   HGB 13.9 06/21/2020 2156   HCT 41.0 06/21/2020 2156   PLT 308 06/21/2020 2151   MCV 86.6 06/21/2020 2151   MCH 26.8 06/21/2020 2151   MCHC 31.0 06/21/2020 2151   RDW 15.0 06/21/2020 2151   LYMPHSABS 2.7 06/21/2020 2151   MONOABS 0.5 06/21/2020 2151   EOSABS 0.2 06/21/2020 2151   BASOSABS 0.1 06/21/2020 2151    CMP     Component Value Date/Time   NA 141 06/21/2020 2156   K 2.9 (L) 06/21/2020 2156   CL 101 06/21/2020 2156   CO2 25 12/01/2019 1200   GLUCOSE 151 (H) 06/21/2020 2156   BUN 15 06/21/2020 2156   CREATININE 0.90 06/21/2020 2156   CALCIUM 8.9 12/01/2019 1200   PROT 6.4 (L) 03/20/2019 0802   ALBUMIN 3.5 03/20/2019 0802   AST 51 (H) 03/20/2019 0802   ALT 37 03/20/2019 0802   ALKPHOS 65 03/20/2019 0802   BILITOT 0.9 03/20/2019 0802   GFRNONAA >60 12/01/2019 1200   GFRAA >60 12/01/2019 1200   Imaging I have reviewed the images obtained:  CT-scan of the brain-aspects 10.  No bleed.  No acute changes. CTA head and neck with minimal atherosclerosis.  No emergent LVO.  Assessment:  59 year old with above past medical history presented for sudden onset of left hand numbness and weakness that  started around 7:30 PM today. She was completely normal right before 7:30 PM and had a sudden onset of the symptoms. On my examination, she had mild paresis of the left upper extremity with distal greater than proximal weakness.  No vertical drift. She also had diminished sensation on the left arm compared to the right. Symptoms concerning for an acute ischemic stroke-although mild but disabling considering significant weakness of the distal upper extremity muscles on the left and a significant incoordination which can lead to significant disability and an otherwise perfectly normally functioning adult individual. Discussed risks and benefits of IV TPA, especially given low stroke scale. Patient agreed to proceed with IV TPA.  Also discussed participation in the [facility] trial and she consented to be a part of the trial.  Impression: Acute ischemic stroke-likely small vessel etiology Hypertension Hyperlipidemia Prediabetes Hypokalemia  Recommendations: -Admit to neurological ICU per post TPA protocol -Frequent neurochecks per the ICU post TPA protocol -She has agreed to be part of the [facility] main trial-currently we are in the standard of care phase which she will get in terms of neurochecks. -No antiplatelets or anticoagulants for at least 24 hours from TPA.  Obtain brain imaging after 24 hours of -TPA to ensure no bleeding and then can start antithrombotics. -CT angio head and neck with no emergent LVO. -MRI brain in 24 hours -2D echo -A1c -Lipid panel -Gentle hydration with 75 cc of normal saline -Check labs and replete as necessary-currently he is hypokalemic.  We will replace potassium. -PT OT speech therapy -N.p.o. until cleared by bedside yell swallow screen. -Systolic blood pressure goal less than 180. -Check chest x-ray and urinalysis -Check urinary drug screen -Further recommendations based on clinical course and testing results.  Stroke team primary and will  follow.  Present on admission: Acute ischemic stroke, hypokalemia, essential hypertension.  -- Amie Portland, MD [facility] Pager: [phone] If 7pm to 7am, please call on call as listed on AMION.   CRITICAL CARE ATTESTATION Performed by: Amie Portland, MD Total critical care time: 38 minutes Critical care time was exclusive of separately billable procedures and treating other patients and/or supervising APPs/Residents/Students  Critical care was necessary to treat or prevent imminent or life-threatening deterioration due to acute ischemic stroke, IV thrombolysis This patient is critically ill and at significant risk for neurological worsening and/or death and care requires constant monitoring. Critical care was time spent personally by me on the following activities: development of treatment plan with patient and/or surrogate as well as nursing, discussions with consultants, evaluation of patient's response to treatment, examination of patient, obtaining history from patient or surrogate, ordering and performing treatments and interventions, ordering and review of laboratory studies, ordering and review of radiographic studies, pulse oximetry, re-evaluation of patient's condition, participation in multidisciplinary rounds and medical decision making of high complexity in the care of this patient.

## 2020-06-21 NOTE — ED Notes (Signed)
MD Schlossman called to triage to evaluate this pt.

## 2020-06-21 NOTE — Progress Notes (Signed)
PHARMACIST CODE STROKE RESPONSE  Notified to mix tPA at 2204 by Dr. Rory Percy Delivered tPA to RN at 2207  tPA dose = 8.4mg  bolus over 1 minute followed by 75.2mg  for a total dose of 83.6mg  over 1 hour  Issues/delays encountered (if applicable):   Duanne Limerick PharmD. BCPS  06/21/20 10:04 PM

## 2020-06-22 ENCOUNTER — Inpatient Hospital Stay (HOSPITAL_COMMUNITY): Payer: 59

## 2020-06-22 ENCOUNTER — Encounter (HOSPITAL_COMMUNITY): Payer: Self-pay | Admitting: Student in an Organized Health Care Education/Training Program

## 2020-06-22 ENCOUNTER — Other Ambulatory Visit: Payer: Self-pay

## 2020-06-22 DIAGNOSIS — Z9282 Status post administration of tPA (rtPA) in a different facility within the last 24 hours prior to admission to current facility: Secondary | ICD-10-CM

## 2020-06-22 DIAGNOSIS — E7800 Pure hypercholesterolemia, unspecified: Secondary | ICD-10-CM

## 2020-06-22 DIAGNOSIS — I639 Cerebral infarction, unspecified: Secondary | ICD-10-CM

## 2020-06-22 DIAGNOSIS — I6389 Other cerebral infarction: Secondary | ICD-10-CM | POA: Diagnosis not present

## 2020-06-22 DIAGNOSIS — I6330 Cerebral infarction due to thrombosis of unspecified cerebral artery: Secondary | ICD-10-CM

## 2020-06-22 LAB — URINALYSIS, ROUTINE W REFLEX MICROSCOPIC
Bilirubin Urine: NEGATIVE
Glucose, UA: NEGATIVE mg/dL
Hgb urine dipstick: NEGATIVE
Ketones, ur: NEGATIVE mg/dL
Leukocytes,Ua: NEGATIVE
Nitrite: NEGATIVE
Protein, ur: NEGATIVE mg/dL
Specific Gravity, Urine: 1.036 — ABNORMAL HIGH (ref 1.005–1.030)
pH: 7 (ref 5.0–8.0)

## 2020-06-22 LAB — BASIC METABOLIC PANEL
Anion gap: 8 (ref 5–15)
BUN: 13 mg/dL (ref 6–20)
CO2: 25 mmol/L (ref 22–32)
Calcium: 8.6 mg/dL — ABNORMAL LOW (ref 8.9–10.3)
Chloride: 104 mmol/L (ref 98–111)
Creatinine, Ser: 0.89 mg/dL (ref 0.44–1.00)
GFR calc Af Amer: >60 mL/min (ref >60)
GFR calc non Af Amer: >60 mL/min (ref >60)
Glucose, Bld: 109 mg/dL — ABNORMAL HIGH (ref 70–99)
Potassium: 4 mmol/L (ref 3.5–5.1)
Sodium: 137 mmol/L (ref 135–145)

## 2020-06-22 LAB — ECHOCARDIOGRAM COMPLETE
Area-P 1/2: 2.76 cm2
Height: 65 in
S' Lateral: 2.9 cm
Weight: 3294.55 oz

## 2020-06-22 LAB — LIPID PANEL
Cholesterol: 140 mg/dL (ref 0–200)
HDL: 53 mg/dL (ref >40)
LDL Cholesterol: 65 mg/dL (ref 0–99)
Total CHOL/HDL Ratio: 2.6 RATIO
Triglycerides: 111 mg/dL (ref <150)
VLDL: 22 mg/dL (ref 0–40)

## 2020-06-22 LAB — HIV ANTIBODY (ROUTINE TESTING W REFLEX): HIV Screen 4th Generation wRfx: NONREACTIVE

## 2020-06-22 LAB — RAPID URINE DRUG SCREEN, HOSP PERFORMED
Amphetamines: NOT DETECTED
Barbiturates: NOT DETECTED
Benzodiazepines: NOT DETECTED
Cocaine: NOT DETECTED
Opiates: NOT DETECTED
Tetrahydrocannabinol: NOT DETECTED

## 2020-06-22 LAB — MRSA PCR SCREENING: MRSA by PCR: NEGATIVE

## 2020-06-22 LAB — HEMOGLOBIN A1C
Hgb A1c MFr Bld: 6.5 % — ABNORMAL HIGH (ref 4.8–5.6)
Mean Plasma Glucose: 139.85 mg/dL

## 2020-06-22 LAB — SARS CORONAVIRUS 2 BY RT PCR (HOSPITAL ORDER, PERFORMED IN ~~LOC~~ HOSPITAL LAB): SARS Coronavirus 2: NEGATIVE

## 2020-06-22 MED ORDER — ASPIRIN EC 81 MG PO TBEC
81.0000 mg | DELAYED_RELEASE_TABLET | Freq: Every day | ORAL | Status: DC
  Administered 2020-06-23: 81 mg via ORAL
  Filled 2020-06-22: qty 1

## 2020-06-22 MED ORDER — ROSUVASTATIN CALCIUM 5 MG PO TABS
10.0000 mg | ORAL_TABLET | Freq: Every evening | ORAL | Status: DC
  Administered 2020-06-22: 10 mg via ORAL
  Filled 2020-06-22: qty 2

## 2020-06-22 MED ORDER — VITAMIN B-12 100 MCG PO TABS
100.0000 ug | ORAL_TABLET | Freq: Every day | ORAL | Status: DC
  Administered 2020-06-22 – 2020-06-23 (×2): 100 ug via ORAL
  Filled 2020-06-22 (×3): qty 1

## 2020-06-22 MED ORDER — CHLORHEXIDINE GLUCONATE CLOTH 2 % EX PADS
6.0000 | MEDICATED_PAD | Freq: Every day | CUTANEOUS | Status: DC
  Administered 2020-06-22: 6 via TOPICAL

## 2020-06-22 MED ORDER — ADULT MULTIVITAMIN W/MINERALS CH
1.0000 | ORAL_TABLET | Freq: Every day | ORAL | Status: DC
  Administered 2020-06-22 – 2020-06-23 (×2): 1 via ORAL
  Filled 2020-06-22 (×2): qty 1

## 2020-06-22 MED ORDER — LEVOTHYROXINE SODIUM 75 MCG PO TABS
75.0000 ug | ORAL_TABLET | ORAL | Status: DC
  Administered 2020-06-23: 75 ug via ORAL
  Filled 2020-06-22: qty 1

## 2020-06-22 MED ORDER — LEVOTHYROXINE SODIUM 75 MCG PO TABS: 37.5000 ug | ORAL_TABLET | ORAL | Status: DC

## 2020-06-22 MED ORDER — CLOPIDOGREL BISULFATE 75 MG PO TABS
75.0000 mg | ORAL_TABLET | Freq: Every day | ORAL | Status: DC
  Administered 2020-06-23: 75 mg via ORAL
  Filled 2020-06-22: qty 1

## 2020-06-22 MED ORDER — PRAMIPEXOLE DIHYDROCHLORIDE 0.125 MG PO TABS
0.1250 mg | ORAL_TABLET | Freq: Every day | ORAL | Status: DC
  Administered 2020-06-22: 0.125 mg via ORAL
  Filled 2020-06-22 (×3): qty 1

## 2020-06-22 MED ORDER — DULOXETINE HCL 30 MG PO CPEP
30.0000 mg | ORAL_CAPSULE | Freq: Every evening | ORAL | Status: DC
  Administered 2020-06-22: 30 mg via ORAL
  Filled 2020-06-22 (×2): qty 1

## 2020-06-22 MED ORDER — LABETALOL HCL 5 MG/ML IV SOLN: 5.0000 mg | INTRAVENOUS | Status: DC | PRN

## 2020-06-22 MED ORDER — ONDANSETRON HCL 4 MG/2ML IJ SOLN
4.0000 mg | Freq: Once | INTRAMUSCULAR | Status: AC
  Administered 2020-06-22: 4 mg via INTRAVENOUS
  Filled 2020-06-22: qty 2

## 2020-06-22 NOTE — Progress Notes (Signed)
STROKE TEAM PROGRESS NOTE   INTERVAL HISTORY Husband at bedside.  Patient sitting in bed, only complaining of left fingertip numbness, otherwise neuro intact.  Tolerating TPA well, continue stroke work-up.  Plan for TEE tomorrow.  Vitals:   06/22/20 0530 06/22/20 0600 06/22/20 0700 06/22/20 0800  BP: 105/67 106/71 (!) 135/108 (!) 129/87  Pulse: 78 75 71 86  Resp: (!) 8 22 13 17   Temp:    98.8 F (37.1 C)  TempSrc:    Oral  SpO2: 95% 96% 95% 96%  Weight:      Height:       CBC:  Recent Labs  Lab 06/21/20 2151 06/21/20 2156  WBC 7.0  --   NEUTROABS 3.6  --   HGB 12.6 13.9  HCT 40.7 41.0  MCV 86.6  --   PLT 308  --    Basic Metabolic Panel:  Recent Labs  Lab 06/21/20 2151 06/21/20 2151 06/21/20 2156 06/22/20 0309  NA 138   < > 141 137  K 2.9*   < > 2.9* 4.0  CL 101   < > 101 104  CO2 27  --   --  25  GLUCOSE 155*   < > 151* 109*  BUN 14   < > 15 13  CREATININE 1.01*   < > 0.90 0.89  CALCIUM 9.3  --   --  8.6*   < > = values in this interval not displayed.   Lipid Panel:  Recent Labs  Lab 06/22/20 0309  CHOL 140  TRIG 111  HDL 53  CHOLHDL 2.6  VLDL 22  LDLCALC 65   HgbA1c:  Recent Labs  Lab 06/22/20 0309  HGBA1C 6.5*   Urine Drug Screen:  Recent Labs  Lab 06/22/20 0004  LABOPIA NONE DETECTED  COCAINSCRNUR NONE DETECTED  LABBENZ NONE DETECTED  AMPHETMU NONE DETECTED  THCU NONE DETECTED  LABBARB NONE DETECTED    Alcohol Level  Recent Labs  Lab 06/21/20 2151  ETH <10    IMAGING past 24 hours CT Code Stroke CTA Head W/WO contrast  Result Date: 06/21/2020 CLINICAL DATA:  59 year old female code stroke presentation. EXAM: CT ANGIOGRAPHY HEAD AND NECK TECHNIQUE: Multidetector CT imaging of the head and neck was performed using the standard protocol during bolus administration of intravenous contrast. Multiplanar CT image reconstructions and MIPs were obtained to evaluate the vascular anatomy. Carotid stenosis measurements (when applicable) are  obtained utilizing NASCET criteria, using the distal internal carotid diameter as the denominator. CONTRAST:  49mL OMNIPAQUE IOHEXOL 350 MG/ML SOLN COMPARISON:  Head CT without contrast 2206 hours today. FINDINGS: CTA NECK Skeleton: Cervical spine disc and endplate degeneration. No acute osseous abnormality identified. Upper chest: Negative. Other neck: Motion artifact at the oropharynx.  Otherwise negative. Aortic arch: 3 vessel arch configuration.  No arch atherosclerosis. Right carotid system: Minimal calcified plaque at the right ICA origin with no stenosis. Left carotid system: Mildly tortuous left CCA.  Otherwise negative. Vertebral arteries: Normal proximal right subclavian artery and right vertebral artery origin. Non dominant right vertebral artery is patent to the skull base without plaque or stenosis. Normal proximal left subclavian artery. The proximal left vertebral artery is obscured by dense paravertebral venous contrast, but from the visible left V2 segment onward the vessel appears normal to the skull base. CTA HEAD Posterior circulation: Dominant left V4 segment. Normal PICA origins. No distal vertebral stenosis. The right vertebral is diminutive beyond PICA but patent to the vertebrobasilar junction. Mildly fenestrated proximal basilar  artery (normal variant). Patent basilar artery without stenosis. Normal SCA origins. Fetal type bilateral PCA origins. Bilateral PCA branches are within normal limits. Both posterior communicating arteries are tortuous. Anterior circulation: Both ICA siphons are patent. Normal ophthalmic and posterior communicating artery origins. No siphon plaque or stenosis. Patent carotid termini, normal MCA and ACA origins. Diminutive or absent anterior communicating artery. Bilateral ACA branches are within normal limits. Left MCA M1 segment and bifurcation are patent without stenosis. Left MCA branches are within normal limits. Right MCA M1 segment and bifurcation are patent  without stenosis. Right MCA branches are within normal limits. Venous sinuses: Patent. Anatomic variants: Dominant left vertebral artery. Fenestrated proximal basilar artery. Bilateral fetal PCA origins. Review of the MIP images confirms the above findings IMPRESSION: Negative for large vessel occlusion, and minimal atherosclerosis with no stenosis in the head or neck. These results were communicated to Dr. Rory Percy at 10:34 pm on 06/21/2020 by text page via the [facility] messaging system. Electronically Signed   By: Genevie Ann M.D.   On: 06/21/2020 22:35   CT Code Stroke CTA Neck W/WO contrast  Result Date: 06/21/2020 CLINICAL DATA:  59 year old female code stroke presentation. EXAM: CT ANGIOGRAPHY HEAD AND NECK TECHNIQUE: Multidetector CT imaging of the head and neck was performed using the standard protocol during bolus administration of intravenous contrast. Multiplanar CT image reconstructions and MIPs were obtained to evaluate the vascular anatomy. Carotid stenosis measurements (when applicable) are obtained utilizing NASCET criteria, using the distal internal carotid diameter as the denominator. CONTRAST:  51mL OMNIPAQUE IOHEXOL 350 MG/ML SOLN COMPARISON:  Head CT without contrast 2206 hours today. FINDINGS: CTA NECK Skeleton: Cervical spine disc and endplate degeneration. No acute osseous abnormality identified. Upper chest: Negative. Other neck: Motion artifact at the oropharynx.  Otherwise negative. Aortic arch: 3 vessel arch configuration.  No arch atherosclerosis. Right carotid system: Minimal calcified plaque at the right ICA origin with no stenosis. Left carotid system: Mildly tortuous left CCA.  Otherwise negative. Vertebral arteries: Normal proximal right subclavian artery and right vertebral artery origin. Non dominant right vertebral artery is patent to the skull base without plaque or stenosis. Normal proximal left subclavian artery. The proximal left vertebral artery is obscured by dense paravertebral  venous contrast, but from the visible left V2 segment onward the vessel appears normal to the skull base. CTA HEAD Posterior circulation: Dominant left V4 segment. Normal PICA origins. No distal vertebral stenosis. The right vertebral is diminutive beyond PICA but patent to the vertebrobasilar junction. Mildly fenestrated proximal basilar artery (normal variant). Patent basilar artery without stenosis. Normal SCA origins. Fetal type bilateral PCA origins. Bilateral PCA branches are within normal limits. Both posterior communicating arteries are tortuous. Anterior circulation: Both ICA siphons are patent. Normal ophthalmic and posterior communicating artery origins. No siphon plaque or stenosis. Patent carotid termini, normal MCA and ACA origins. Diminutive or absent anterior communicating artery. Bilateral ACA branches are within normal limits. Left MCA M1 segment and bifurcation are patent without stenosis. Left MCA branches are within normal limits. Right MCA M1 segment and bifurcation are patent without stenosis. Right MCA branches are within normal limits. Venous sinuses: Patent. Anatomic variants: Dominant left vertebral artery. Fenestrated proximal basilar artery. Bilateral fetal PCA origins. Review of the MIP images confirms the above findings IMPRESSION: Negative for large vessel occlusion, and minimal atherosclerosis with no stenosis in the head or neck. These results were communicated to Dr. Rory Percy at 10:34 pm on 06/21/2020 by text page via the [facility] messaging system. Electronically  Signed   By: Genevie Ann M.D.   On: 06/21/2020 22:35   CT HEAD CODE STROKE WO CONTRAST  Result Date: 06/21/2020 CLINICAL DATA:  Code stroke. Initial evaluation for neuro deficit, stroke suspected. EXAM: CT HEAD WITHOUT CONTRAST TECHNIQUE: Contiguous axial images were obtained from the base of the skull through the vertex without intravenous contrast. COMPARISON:  Prior CT from 05/25/2005. FINDINGS: Brain: Cerebral volume within  normal limits for patient age. No evidence for acute intracranial hemorrhage. No findings to suggest acute large vessel territory infarct. No mass lesion, midline shift, or mass effect. Ventricles are normal in size without evidence for hydrocephalus. No extra-axial fluid collection identified. Vascular: No hyperdense vessel identified. Skull: Scalp soft tissues demonstrate no acute abnormality. Calvarium intact. Sinuses/Orbits: Globes and orbital soft tissues within normal limits. Visualized paranasal sinuses are clear. No mastoid effusion. ASPECTS [facility] Stroke Program Early CT Score) - Ganglionic level infarction (caudate, lentiform nuclei, internal capsule, insula, M1-M3 cortex): 7 - Supraganglionic infarction (M4-M6 cortex): 3 Total score (0-10 with 10 being normal): 10 IMPRESSION: 1. Negative head CT.  No acute intracranial abnormality identified. 2. ASPECTS is 10. These results were communicated to Dr. Rory Percy at 10:13 [place] 7/28/2021by text page via the [facility] messaging system. Electronically Signed   By: Jeannine Boga M.D.   On: 06/21/2020 22:13    PHYSICAL EXAM  Temp:  [97.2 F (36.2 C)-98.8 F (37.1 C)] 98 F (36.7 C) (07/29 1600) Pulse Rate:  [71-92] 74 (07/29 1500) Resp:  [8-26] 18 (07/29 1600) BP: (95-153)/(47-109) 142/97 (07/29 1700) SpO2:  [93 %-100 %] 93 % (07/29 1500) Weight:  [92.9 kg-93.4 kg] 93.4 kg (07/29 0215)  General - Well nourished, well developed, in no apparent distress.  Ophthalmologic - fundi not visualized due to noncooperation.  Cardiovascular - Regular rhythm and rate.  Mental Status -  Level of arousal and orientation to time, place, and person were intact. Language including expression, naming, repetition, comprehension was assessed and found intact. Attention span and concentration were normal. Fund of Knowledge was assessed and was intact.  Cranial Nerves II - XII - II - Visual field intact OU. III, IV, VI - Extraocular movements intact. V -  Facial sensation intact bilaterally. VII - Facial movement intact bilaterally. VIII - Hearing & vestibular intact bilaterally. X - Palate elevates symmetrically. XI - Chin turning & shoulder shrug intact bilaterally. XII - Tongue protrusion intact.  Motor Strength - The patient's strength was normal in all extremities and pronator drift was absent.  Bulk was normal and fasciculations were absent.   Motor Tone - Muscle tone was assessed at the neck and appendages and was normal.  Reflexes - The patient's reflexes were symmetrical in all extremities and she had no pathological reflexes.  Sensory - Light touch, temperature/pinprick were assessed and were symmetrical except left fingertip decreased light touch sensation.  Coordination - The patient had normal movements in the hands and feet with no ataxia or dysmetria.  Tremor was absent.  Gait and Station - deferred.   ASSESSMENT/PLAN Ms. [name] is a 59 y.o. female with history of hypertension, hyperlipidemia, hypothyroidism, right shoulder rotator cuff injury status post repair presenting with L arm and hand weakness and numbness. Received tPA 06/21/2020 at 2210.  Stroke:  right brain infarct s/p tPA, embolic secondary to unknown source  Code Stroke CT head No acute abnormality. ASPECTS 10.     CTA head & neck Unremarkable   MRI  pending   2D Echo EF 60  to 65%  LE venous Doppler pending  TEE tomorrow  LDL 65  HgbA1c 6.5  UDS negative  Hypercoagulable work-up pending  VTE prophylaxis - SCDs   No antithrombotic prior to admission, now on No antithrombotic within 24 hours TPA.   Therapy recommendations:  Pending. Ok to be OOB  Disposition:  Return home  Hypertension  Home meds:  Cozaar 100  Stable BP goal < 180/105 . Long-term BP goal normotensive  Hyperlipidemia  Home meds:  crestor 10, resumed in hospital  LDL 65, goal < 70  Continue statin at discharge  PreDiabetes   HgbA1c 6.5, goal <  7.0  Diet control  Exercises  PCP follow-up  CBG monitoring  SSI  Other Stroke Risk Factors  Former Cigarette smoker, quit 17 yrs ago  ETOH use, alcohol level <10, advised to drink no more than 1 drink(s) a day  Obesity, Body mass index is 34.27 kg/m., hx weight loss surgery (balloon), recommend weight loss, diet and exercise as appropriate   Family hx stroke (father)  On Oral estrogen - recommend to stop if agree from OB/GYN  Other Active Problems  Hypothyroid on synthroid   GERD on PPI  RLS on mirapex  Insomnia on melatonin  on cymbalta   Hospital day # 1  This patient is critically ill due to stroke status post TPA and at significant risk of neurological worsening, death form recurrent stroke, hemorrhagic conversion, hemorrhage post TPA. This patient's care requires constant monitoring of vital signs, hemodynamics, respiratory and cardiac monitoring, review of multiple databases, neurological assessment, discussion with family, other specialists and medical decision making of high complexity. I spent 30 minutes of neurocritical care time in the care of this patient.  Rosalin Hawking, MD PhD Stroke Neurology 06/22/2020 7:13 PM    To contact Stroke Continuity provider, please refer to [email or web address] After hours, contact General Neurology

## 2020-06-22 NOTE — Evaluation (Signed)
Speech Language Pathology Evaluation Patient Details Name: [name] MRN: [id number] DOB: [birth year] Today's Date: 06/22/2020 Time: 4098-1191 SLP Time Calculation (min) (ACUTE ONLY): 19 min  Problem List:  Patient Active Problem List   Diagnosis Date Noted   Cerebral thrombosis with cerebral infarction 06/21/2020   Acute ischemic stroke ([place]) 06/21/2020   Morbid obesity ([place]) 09/16/2012   HYPERLIPIDEMIA 09/06/2008   FIBROMYALGIA 09/06/2008   HEADACHE, CHRONIC 09/06/2008   FIBROIDS, UTERUS 09/01/2008   HYPOTHYROIDISM 09/01/2008   GERD 09/01/2008   Past Medical History:  Past Medical History:  Diagnosis Date   Abnormal Pap smear of cervix    Anemia 2007   due to fibroids   Duodenitis without hemorrhage    Esophageal stricture    Esophagitis    GERD (gastroesophageal reflux disease)    Hiatal hernia    Hyperlipidemia    Hypertension    Hypothyroidism    Leiomyoma of uterus, unspecified    fibroids   Pneumonia    aspiration pneumonia after balloon surgery for weight loss   Past Surgical History:  Past Surgical History:  Procedure Laterality Date   BICEPT TENODESIS Right 12/07/2019   Procedure: BICEPS TENODESIS;  Surgeon: Meredith Pel, MD;  Location: [facility];  Service: Orthopedics;  Laterality: Right;   CARPAL TUNNEL RELEASE  2014   right wrist   CHOLECYSTECTOMY     HYSTEROSCOPY W/ ENDOMETRIAL ABLATION  4/05   RECTOCELE REPAIR  2007   SHOULDER ARTHROSCOPY WITH OPEN ROTATOR CUFF REPAIR Right 12/07/2019   Procedure: RIGHT SHOULDER ARTHROSCOPY,   MINI OPEN SUBSCAPULARIS, AND SUPRASPINATUS REPAIR, OPEN BICEPS TENODESIS;  Surgeon: Meredith Pel, MD;  Location: [facility];  Service: Orthopedics;  Laterality: Right;   TONSILLECTOMY     as child   TOTAL VAGINAL HYSTERECTOMY  5/07   TUBAL LIGATION  1991   BILATERAL   HPI:  59 y.o. female with past medical history of hypertension, hyperlipidemia,  hypothyroidism, right shoulder rotator cuff injury status post repair, who is an Surveyor, quantity nursing at [employer] admitted after sudden onset of numbness and weakness on the left hand.  CT head negative. IV tPA administered.    Assessment / Plan / Recommendation Clinical Impression  Pt was administered the SLUMs, achieving a score of 27/30, considered WNL - she demonstrates good awareness, working memory, executive functioning and insight.  Speech is clear without dysarthria.  No SLP f/u is needed.  D/W pt.     SLP Assessment  SLP Recommendation/Assessment: Patient does not need any further Speech Lanaguage Pathology Services    Follow Up Recommendations  None    Frequency and Duration           SLP Evaluation Cognition  Overall Cognitive Status: Within Functional Limits for tasks assessed Arousal/Alertness: Awake/alert Attention: Alternating Alternating Attention: Appears intact Memory: Appears intact Awareness: Appears intact Problem Solving: Appears intact Safety/Judgment: Appears intact       Comprehension  Auditory Comprehension Overall Auditory Comprehension: Appears within functional limits for tasks assessed Reading Comprehension Reading Status: Within funtional limits    Expression Expression Primary Mode of Expression: Verbal Verbal Expression Overall Verbal Expression: Appears within functional limits for tasks assessed Written Expression Dominant Hand: Right Written Expression: Within Functional Limits   Oral / Motor  Oral Motor/Sensory Function Overall Oral Motor/Sensory Function: Within functional limits Motor Speech Overall Motor Speech: Appears within functional limits for tasks assessed   GO  Juan Quam Laurice 06/22/2020, 9:48 AM  Park Pope. Tivis Ringer, [place] Office number [phone] Pager [phone]

## 2020-06-22 NOTE — Progress Notes (Signed)
    [facility] has been requested to perform a transesophageal echocardiogram on 06/23/20 for stoke.  After careful review of history and examination, the risks and benefits of transesophageal echocardiogram have been explained including risks of esophageal damage, perforation (1:10,000 risk), bleeding, pharyngeal hematoma as well as other potential complications associated with conscious sedation including aspiration, arrhythmia, respiratory failure and death. Alternatives to treatment were discussed, questions were answered. Patient is willing to proceed.   TEE scheduled for 06/23/20 at 11:30am with Dr. Gardiner Rhyme.  Roby Lofts, PA-C 06/22/2020 1:50 PM

## 2020-06-22 NOTE — Code Documentation (Addendum)
Stroke Response Nurse Documentation Code Documentation  [name] is a 59 y.o. female arriving to [place] ED via Private Vehicle on 7/28 with past medical hx of HTN, HLD. Code stroke was activated by ED (EMS, ED). Patient from home where she was LKW at 1900 and now complaining of Left arm numbness . Stroke team at the bedside on patient arrival. Labs drawn and patient cleared for CT by Dr.Schlossman . Patient to CT with team. NIHSS 1, see documentation for details and code stroke times. Patient with left decreased sensation on exam. The following imaging was completed:  CT/CTA (CT, CTA head and neck, CTP). Patient is a candidate for tPA and initiated at 2210. Bedside handoff with ED RN Annie Main.    Madelynn Done  Rapid Response RN

## 2020-06-22 NOTE — H&P (View-Only) (Signed)
STROKE TEAM PROGRESS NOTE   INTERVAL HISTORY Husband at bedside.  Patient sitting in bed, only complaining of left fingertip numbness, otherwise neuro intact.  Tolerating TPA well, continue stroke work-up.  Plan for TEE tomorrow.  Vitals:   06/22/20 0530 06/22/20 0600 06/22/20 0700 06/22/20 0800  BP: 105/67 106/71 (!) 135/108 (!) 129/87  Pulse: 78 75 71 86  Resp: (!) 8 22 13 17   Temp:    98.8 F (37.1 C)  TempSrc:    Oral  SpO2: 95% 96% 95% 96%  Weight:      Height:       CBC:  Recent Labs  Lab 06/21/20 2151 06/21/20 2156  WBC 7.0  --   NEUTROABS 3.6  --   HGB 12.6 13.9  HCT 40.7 41.0  MCV 86.6  --   PLT 308  --    Basic Metabolic Panel:  Recent Labs  Lab 06/21/20 2151 06/21/20 2151 06/21/20 2156 06/22/20 0309  NA 138   < > 141 137  K 2.9*   < > 2.9* 4.0  CL 101   < > 101 104  CO2 27  --   --  25  GLUCOSE 155*   < > 151* 109*  BUN 14   < > 15 13  CREATININE 1.01*   < > 0.90 0.89  CALCIUM 9.3  --   --  8.6*   < > = values in this interval not displayed.   Lipid Panel:  Recent Labs  Lab 06/22/20 0309  CHOL 140  TRIG 111  HDL 53  CHOLHDL 2.6  VLDL 22  LDLCALC 65   HgbA1c:  Recent Labs  Lab 06/22/20 0309  HGBA1C 6.5*   Urine Drug Screen:  Recent Labs  Lab 06/22/20 0004  LABOPIA NONE DETECTED  COCAINSCRNUR NONE DETECTED  LABBENZ NONE DETECTED  AMPHETMU NONE DETECTED  THCU NONE DETECTED  LABBARB NONE DETECTED    Alcohol Level  Recent Labs  Lab 06/21/20 2151  ETH <10    IMAGING past 24 hours CT Code Stroke CTA Head W/WO contrast  Result Date: 06/21/2020 CLINICAL DATA:  59 year old female code stroke presentation. EXAM: CT ANGIOGRAPHY HEAD AND NECK TECHNIQUE: Multidetector CT imaging of the head and neck was performed using the standard protocol during bolus administration of intravenous contrast. Multiplanar CT image reconstructions and MIPs were obtained to evaluate the vascular anatomy. Carotid stenosis measurements (when applicable) are  obtained utilizing NASCET criteria, using the distal internal carotid diameter as the denominator. CONTRAST:  22mL OMNIPAQUE IOHEXOL 350 MG/ML SOLN COMPARISON:  Head CT without contrast 2206 hours today. FINDINGS: CTA NECK Skeleton: Cervical spine disc and endplate degeneration. No acute osseous abnormality identified. Upper chest: Negative. Other neck: Motion artifact at the oropharynx.  Otherwise negative. Aortic arch: 3 vessel arch configuration.  No arch atherosclerosis. Right carotid system: Minimal calcified plaque at the right ICA origin with no stenosis. Left carotid system: Mildly tortuous left CCA.  Otherwise negative. Vertebral arteries: Normal proximal right subclavian artery and right vertebral artery origin. Non dominant right vertebral artery is patent to the skull base without plaque or stenosis. Normal proximal left subclavian artery. The proximal left vertebral artery is obscured by dense paravertebral venous contrast, but from the visible left V2 segment onward the vessel appears normal to the skull base. CTA HEAD Posterior circulation: Dominant left V4 segment. Normal PICA origins. No distal vertebral stenosis. The right vertebral is diminutive beyond PICA but patent to the vertebrobasilar junction. Mildly fenestrated proximal basilar  artery (normal variant). Patent basilar artery without stenosis. Normal SCA origins. Fetal type bilateral PCA origins. Bilateral PCA branches are within normal limits. Both posterior communicating arteries are tortuous. Anterior circulation: Both ICA siphons are patent. Normal ophthalmic and posterior communicating artery origins. No siphon plaque or stenosis. Patent carotid termini, normal MCA and ACA origins. Diminutive or absent anterior communicating artery. Bilateral ACA branches are within normal limits. Left MCA M1 segment and bifurcation are patent without stenosis. Left MCA branches are within normal limits. Right MCA M1 segment and bifurcation are patent  without stenosis. Right MCA branches are within normal limits. Venous sinuses: Patent. Anatomic variants: Dominant left vertebral artery. Fenestrated proximal basilar artery. Bilateral fetal PCA origins. Review of the MIP images confirms the above findings IMPRESSION: Negative for large vessel occlusion, and minimal atherosclerosis with no stenosis in the head or neck. These results were communicated to Dr. Rory Percy at 10:34 pm on 06/21/2020 by text page via the [facility] messaging system. Electronically Signed   By: Genevie Ann M.D.   On: 06/21/2020 22:35   CT Code Stroke CTA Neck W/WO contrast  Result Date: 06/21/2020 CLINICAL DATA:  59 year old female code stroke presentation. EXAM: CT ANGIOGRAPHY HEAD AND NECK TECHNIQUE: Multidetector CT imaging of the head and neck was performed using the standard protocol during bolus administration of intravenous contrast. Multiplanar CT image reconstructions and MIPs were obtained to evaluate the vascular anatomy. Carotid stenosis measurements (when applicable) are obtained utilizing NASCET criteria, using the distal internal carotid diameter as the denominator. CONTRAST:  66mL OMNIPAQUE IOHEXOL 350 MG/ML SOLN COMPARISON:  Head CT without contrast 2206 hours today. FINDINGS: CTA NECK Skeleton: Cervical spine disc and endplate degeneration. No acute osseous abnormality identified. Upper chest: Negative. Other neck: Motion artifact at the oropharynx.  Otherwise negative. Aortic arch: 3 vessel arch configuration.  No arch atherosclerosis. Right carotid system: Minimal calcified plaque at the right ICA origin with no stenosis. Left carotid system: Mildly tortuous left CCA.  Otherwise negative. Vertebral arteries: Normal proximal right subclavian artery and right vertebral artery origin. Non dominant right vertebral artery is patent to the skull base without plaque or stenosis. Normal proximal left subclavian artery. The proximal left vertebral artery is obscured by dense paravertebral  venous contrast, but from the visible left V2 segment onward the vessel appears normal to the skull base. CTA HEAD Posterior circulation: Dominant left V4 segment. Normal PICA origins. No distal vertebral stenosis. The right vertebral is diminutive beyond PICA but patent to the vertebrobasilar junction. Mildly fenestrated proximal basilar artery (normal variant). Patent basilar artery without stenosis. Normal SCA origins. Fetal type bilateral PCA origins. Bilateral PCA branches are within normal limits. Both posterior communicating arteries are tortuous. Anterior circulation: Both ICA siphons are patent. Normal ophthalmic and posterior communicating artery origins. No siphon plaque or stenosis. Patent carotid termini, normal MCA and ACA origins. Diminutive or absent anterior communicating artery. Bilateral ACA branches are within normal limits. Left MCA M1 segment and bifurcation are patent without stenosis. Left MCA branches are within normal limits. Right MCA M1 segment and bifurcation are patent without stenosis. Right MCA branches are within normal limits. Venous sinuses: Patent. Anatomic variants: Dominant left vertebral artery. Fenestrated proximal basilar artery. Bilateral fetal PCA origins. Review of the MIP images confirms the above findings IMPRESSION: Negative for large vessel occlusion, and minimal atherosclerosis with no stenosis in the head or neck. These results were communicated to Dr. Rory Percy at 10:34 pm on 06/21/2020 by text page via the [facility] messaging system. Electronically  Signed   By: Genevie Ann M.D.   On: 06/21/2020 22:35   CT HEAD CODE STROKE WO CONTRAST  Result Date: 06/21/2020 CLINICAL DATA:  Code stroke. Initial evaluation for neuro deficit, stroke suspected. EXAM: CT HEAD WITHOUT CONTRAST TECHNIQUE: Contiguous axial images were obtained from the base of the skull through the vertex without intravenous contrast. COMPARISON:  Prior CT from 05/25/2005. FINDINGS: Brain: Cerebral volume within  normal limits for patient age. No evidence for acute intracranial hemorrhage. No findings to suggest acute large vessel territory infarct. No mass lesion, midline shift, or mass effect. Ventricles are normal in size without evidence for hydrocephalus. No extra-axial fluid collection identified. Vascular: No hyperdense vessel identified. Skull: Scalp soft tissues demonstrate no acute abnormality. Calvarium intact. Sinuses/Orbits: Globes and orbital soft tissues within normal limits. Visualized paranasal sinuses are clear. No mastoid effusion. ASPECTS [facility] Stroke Program Early CT Score) - Ganglionic level infarction (caudate, lentiform nuclei, internal capsule, insula, M1-M3 cortex): 7 - Supraganglionic infarction (M4-M6 cortex): 3 Total score (0-10 with 10 being normal): 10 IMPRESSION: 1. Negative head CT.  No acute intracranial abnormality identified. 2. ASPECTS is 10. These results were communicated to Dr. Rory Percy at 10:13 [place] 7/28/2021by text page via the [facility] messaging system. Electronically Signed   By: Jeannine Boga M.D.   On: 06/21/2020 22:13    PHYSICAL EXAM  Temp:  [97.2 F (36.2 C)-98.8 F (37.1 C)] 98 F (36.7 C) (07/29 1600) Pulse Rate:  [71-92] 74 (07/29 1500) Resp:  [8-26] 18 (07/29 1600) BP: (95-153)/(47-109) 142/97 (07/29 1700) SpO2:  [93 %-100 %] 93 % (07/29 1500) Weight:  [92.9 kg-93.4 kg] 93.4 kg (07/29 0215)  General - Well nourished, well developed, in no apparent distress.  Ophthalmologic - fundi not visualized due to noncooperation.  Cardiovascular - Regular rhythm and rate.  Mental Status -  Level of arousal and orientation to time, place, and person were intact. Language including expression, naming, repetition, comprehension was assessed and found intact. Attention span and concentration were normal. Fund of Knowledge was assessed and was intact.  Cranial Nerves II - XII - II - Visual field intact OU. III, IV, VI - Extraocular movements intact. V -  Facial sensation intact bilaterally. VII - Facial movement intact bilaterally. VIII - Hearing & vestibular intact bilaterally. X - Palate elevates symmetrically. XI - Chin turning & shoulder shrug intact bilaterally. XII - Tongue protrusion intact.  Motor Strength - The patient's strength was normal in all extremities and pronator drift was absent.  Bulk was normal and fasciculations were absent.   Motor Tone - Muscle tone was assessed at the neck and appendages and was normal.  Reflexes - The patient's reflexes were symmetrical in all extremities and she had no pathological reflexes.  Sensory - Light touch, temperature/pinprick were assessed and were symmetrical except left fingertip decreased light touch sensation.  Coordination - The patient had normal movements in the hands and feet with no ataxia or dysmetria.  Tremor was absent.  Gait and Station - deferred.   ASSESSMENT/PLAN Ms. [name] is a 59 y.o. female with history of hypertension, hyperlipidemia, hypothyroidism, right shoulder rotator cuff injury status post repair presenting with L arm and hand weakness and numbness. Received tPA 06/21/2020 at 2210.  Stroke:  right brain infarct s/p tPA, embolic secondary to unknown source  Code Stroke CT head No acute abnormality. ASPECTS 10.     CTA head & neck Unremarkable   MRI  pending   2D Echo EF 60  to 65%  LE venous Doppler pending  TEE tomorrow  LDL 65  HgbA1c 6.5  UDS negative  Hypercoagulable work-up pending  VTE prophylaxis - SCDs   No antithrombotic prior to admission, now on No antithrombotic within 24 hours TPA.   Therapy recommendations:  Pending. Ok to be OOB  Disposition:  Return home  Hypertension  Home meds:  Cozaar 100  Stable BP goal < 180/105 . Long-term BP goal normotensive  Hyperlipidemia  Home meds:  crestor 10, resumed in hospital  LDL 65, goal < 70  Continue statin at discharge  PreDiabetes   HgbA1c 6.5, goal <  7.0  Diet control  Exercises  PCP follow-up  CBG monitoring  SSI  Other Stroke Risk Factors  Former Cigarette smoker, quit 17 yrs ago  ETOH use, alcohol level <10, advised to drink no more than 1 drink(s) a day  Obesity, Body mass index is 34.27 kg/m., hx weight loss surgery (balloon), recommend weight loss, diet and exercise as appropriate   Family hx stroke (father)  On Oral estrogen - recommend to stop if agree from OB/GYN  Other Active Problems  Hypothyroid on synthroid   GERD on PPI  RLS on mirapex  Insomnia on melatonin  on cymbalta   Hospital day # 1  This patient is critically ill due to stroke status post TPA and at significant risk of neurological worsening, death form recurrent stroke, hemorrhagic conversion, hemorrhage post TPA. This patient's care requires constant monitoring of vital signs, hemodynamics, respiratory and cardiac monitoring, review of multiple databases, neurological assessment, discussion with family, other specialists and medical decision making of high complexity. I spent 30 minutes of neurocritical care time in the care of this patient.  Rosalin Hawking, MD PhD Stroke Neurology 06/22/2020 7:13 PM    To contact Stroke Continuity provider, please refer to [email or web address] After hours, contact General Neurology

## 2020-06-22 NOTE — Evaluation (Signed)
Physical Therapy Evaluation Patient Details Name: [name] MRN: [id number] DOB: 28-Jul-1961 Today's Date: 06/22/2020   History of Present Illness  59 y.o. 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  CT head negative. IV tPA administered.  Clinical Impression  Patient presents with mobility close to functional baseline.  Reports still mild numbness in L affecting function with picking up small items.  She was independent with mobility including steps with 23/24 on DGI demonstrating low fall risk.  Educated on gradual return to activities.  No further skilled PT needs at this time.  Will sign off.     Follow Up Recommendations No PT follow up    Equipment Recommendations  None recommended by PT    Recommendations for Other Services       Precautions / Restrictions Precautions Precautions: None      Mobility  Bed Mobility Overal bed mobility: Modified Independent                Transfers Overall transfer level: Modified independent                  Ambulation/Gait Ambulation/Gait assistance: Independent   Assistive device: None Gait Pattern/deviations: WFL(Within Functional Limits)        Stairs Stairs: Yes Stairs assistance: Modified independent (Device/Increase time) Stair Management: One rail Left;Alternating pattern;Forwards Number of Stairs: 3 General stair comments: limited # of steps due to lines  Wheelchair Mobility    Modified Rankin (Stroke Patients Only) Modified Rankin (Stroke Patients Only) Pre-Morbid Rankin Score: No symptoms Modified Rankin: Slight disability     Balance Overall balance assessment: Independent   Sitting balance-Leahy Scale: Normal       Standing balance-Leahy Scale: Normal                    Standardized Balance Assessment Standardized Balance Assessment : Dynamic Gait Index   Dynamic Gait Index Level Surface: Normal Change in Gait Speed: Normal Gait with Horizontal Head Turns: Normal Gait with Vertical Head Turns: Normal Gait and Pivot Turn: Normal Step Over Obstacle: Normal Step Around Obstacles: Normal Steps: Mild Impairment Total Score: 23       Pertinent Vitals/Pain Pain Assessment: No/denies pain    Home Living Family/patient expects to be discharged to:: Private residence   Available Help at Discharge: Family Type of Home: House Home Access: Stairs to enter Entrance Stairs-Rails: Left Entrance Stairs-Number of Steps: 6 Home Layout: Two level;Able to live on main level with bedroom/bathroom Home Equipment: Shower seat - built in;None      Prior Function Level of Independence: Independent         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[employer]     Hand Dominance   Dominant Hand: Right    Extremity/Trunk Assessment   Upper Extremity Assessment Upper Extremity Assessment: LUE deficits/detail LUE Deficits / Details: still numb on fingertips and some difficulty picking up sugar packets on tray with breakfast this morning LUE Sensation: decreased light touch    Lower Extremity Assessment Lower Extremity Assessment: Overall WFL for tasks assessed       Communication   Communication: No difficulties  Cognition Arousal/Alertness: Awake/alert Behavior During Therapy: WFL for tasks assessed/performed Overall Cognitive Status: Within Functional Limits for tasks assessed  General Comments      Exercises     Assessment/Plan    PT Assessment Patent does not need any further PT services  PT Problem List         PT Treatment Interventions      PT Goals (Current goals can be found in the Care Plan section)  Acute Rehab PT Goals PT Goal Formulation: All assessment and education complete,  DC therapy    Frequency     Barriers to discharge        Co-evaluation               AM-PAC PT "6 Clicks" Mobility  Outcome Measure Help needed turning from your back to your side while in a flat bed without using bedrails?: None Help needed moving from lying on your back to sitting on the side of a flat bed without using bedrails?: None Help needed moving to and from a bed to a chair (including a wheelchair)?: None Help needed standing up from a chair using your arms (e.g., wheelchair or bedside chair)?: None Help needed to walk in hospital room?: None Help needed climbing 3-5 steps with a railing? : None 6 Click Score: 24    End of Session   Activity Tolerance: Patient tolerated treatment well Patient left: in bed;with call bell/phone within reach   PT Visit Diagnosis: Other symptoms and signs involving the nervous system (R29.898)    Time: 1000-1014 PT Time Calculation (min) (ACUTE ONLY): 14 min   Charges:   PT Evaluation $PT Eval Low Complexity: 1 Low          Magda Kiel, PT Acute Rehabilitation Services Pager:[phone] Office:[phone] 06/22/2020   Reginia Naas 06/22/2020, 12:40 PM

## 2020-06-22 NOTE — Progress Notes (Signed)
  Echocardiogram 2D Echocardiogram has been performed.  [name] 06/22/2020, 9:17 AM

## 2020-06-22 NOTE — Evaluation (Signed)
Occupational Therapy Evaluation Patient Details Name: [name] MRN: [id number] DOB: 1960/11/26 Today's Date: 06/22/2020    History of Present Illness 59 y.o. 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  CT head negative. IV tPA administered.   Clinical Impression   PTA pt living with family and functioning at independent community level. At time of eval, pt presents with ability to complete bed mobility and sit <> stands independently without safety concern or LOB. Pt is currently completing BADL at mod I level. Remainder of session focused on L hand [place] HEP. Pt reports numbness in fingertips and loss of strength causing her to drop small items. Issued pt [facility] handout with various exercises that can be completed with basic household items. Theraputty and resistive clothespin also issued for pt to exercise. She was able to teach back all exercise at end of session. Educated pt on safety with loss of sensation (ex. Hot stove, etc.) Given current status, recommend no further OT follow up. OT will sign off, thank you for this consult.    Follow Up Recommendations  No OT follow up;Supervision - Intermittent    Equipment Recommendations  None recommended by OT    Recommendations for Other Services       Precautions / Restrictions Precautions Precautions: None Restrictions Weight Bearing Restrictions: No      Mobility Bed Mobility               General bed mobility comments: up in chair  Transfers Overall transfer level: Modified independent                    Balance Overall balance assessment: Independent                                         ADL either performed or assessed with clinical judgement   ADL Overall ADL's : Modified  independent;At baseline                                       General ADL Comments: Pt demonstrates ability to complete BADL at mod I level. She demonstrated toilet transfer, LB dressing simulation, and good standing tolerance for BADLs without external assist from OT     Vision Patient Visual Report: No change from baseline       Perception     Praxis      Pertinent Vitals/Pain Pain Assessment: No/denies pain     Hand Dominance Right   Extremity/Trunk Assessment Upper Extremity Assessment Upper Extremity Assessment: LUE deficits/detail LUE Deficits / Details: reports numbness in fingertips, overall decreased fine grasp LUE Sensation: decreased light touch LUE Coordination: decreased fine motor   Lower Extremity Assessment Lower Extremity Assessment: Overall WFL for tasks assessed       Communication Communication Communication: No difficulties   Cognition Arousal/Alertness: Awake/alert Behavior During Therapy: WFL for tasks assessed/performed Overall Cognitive Status: Within Functional Limits for tasks assessed                                     General  Comments       Exercises Other Exercises Other Exercises: issued pt [place] HEP for home- pt practiced all exercises with available supplies x1 (L hand) Other Exercises: issued theraputty and instructed on exercises to improve intrinsic strength (L hand) Other Exercises: issued resisted clothespin to improve pincer grasp (L hand)   Shoulder Instructions      Home Living Family/patient expects to be discharged to:: Private residence Living Arrangements: Spouse/significant other Available Help at Discharge: Family Type of Home: House Home Access: Stairs to enter Technical brewer of Steps: 6 Entrance Stairs-Rails: Left Home Layout: Two level;Able to live on main level with bedroom/bathroom     Bathroom Shower/Tub: Occupational psychologist: Standard     Home  Equipment: Shower seat - built in;None          Prior Functioning/Environment Level of Independence: Independent        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[employer]        OT Problem List: Decreased strength;Impaired UE functional use;Impaired sensation;Decreased coordination      OT Treatment/Interventions:      OT Goals(Current goals can be found in the care plan section) Acute Rehab OT Goals Patient Stated Goal: return to work OT Goal Formulation: All assessment and education complete, DC therapy  OT Frequency:     Barriers to D/C:            Co-evaluation              AM-PAC OT "6 Clicks" Daily Activity     Outcome Measure Help from another person eating meals?: None Help from another person taking care of personal grooming?: None Help from another person toileting, which includes using toliet, bedpan, or urinal?: None Help from another person bathing (including washing, rinsing, drying)?: None Help from another person to put on and taking off regular upper body clothing?: None Help from another person to put on and taking off regular lower body clothing?: None 6 Click Score: 24   End of Session Nurse Communication: Mobility status  Activity Tolerance: Patient tolerated treatment well Patient left: in chair;with call bell/phone within reach;with family/visitor present  OT Visit Diagnosis: Other symptoms and signs involving the nervous system (R29.898)                Time: 3664-4034 OT Time Calculation (min): 29 min Charges:  OT General Charges $OT Visit: 1 Visit OT Evaluation $OT Eval Low Complexity: 1 Low OT Treatments $Self Care/Home Management : 8-22 mins  Zenovia Jarred, MSOT, OTR/L [facility] Office Number: [phone] Pager: [phone]  Zenovia Jarred 06/22/2020, 6:15 PM

## 2020-06-23 ENCOUNTER — Inpatient Hospital Stay (HOSPITAL_COMMUNITY): Payer: 59

## 2020-06-23 ENCOUNTER — Inpatient Hospital Stay (HOSPITAL_COMMUNITY): Payer: 59 | Admitting: Certified Registered Nurse Anesthetist

## 2020-06-23 ENCOUNTER — Encounter (HOSPITAL_COMMUNITY): Payer: Self-pay | Admitting: Student in an Organized Health Care Education/Training Program

## 2020-06-23 ENCOUNTER — Encounter (HOSPITAL_COMMUNITY)
Admission: EM | Disposition: A | Discharge status: Home / Self Care | Payer: Self-pay | Source: Home / Self Care | Attending: Neurology

## 2020-06-23 DIAGNOSIS — I639 Cerebral infarction, unspecified: Secondary | ICD-10-CM

## 2020-06-23 DIAGNOSIS — E785 Hyperlipidemia, unspecified: Secondary | ICD-10-CM

## 2020-06-23 DIAGNOSIS — R7303 Prediabetes: Secondary | ICD-10-CM

## 2020-06-23 DIAGNOSIS — Z823 Family history of stroke: Secondary | ICD-10-CM

## 2020-06-23 DIAGNOSIS — I63411 Cerebral infarction due to embolism of right middle cerebral artery: Principal | ICD-10-CM

## 2020-06-23 DIAGNOSIS — I340 Nonrheumatic mitral (valve) insufficiency: Secondary | ICD-10-CM

## 2020-06-23 DIAGNOSIS — G2581 Restless legs syndrome: Secondary | ICD-10-CM

## 2020-06-23 DIAGNOSIS — Z9889 Other specified postprocedural states: Secondary | ICD-10-CM

## 2020-06-23 DIAGNOSIS — I10 Essential (primary) hypertension: Secondary | ICD-10-CM | POA: Diagnosis present

## 2020-06-23 HISTORY — PX: TEE WITHOUT CARDIOVERSION: SHX5443

## 2020-06-23 HISTORY — PX: BUBBLE STUDY: SHX6837

## 2020-06-23 HISTORY — DX: Other specified postprocedural states: Z98.890

## 2020-06-23 HISTORY — PX: LOOP RECORDER INSERTION: EP1214

## 2020-06-23 LAB — BASIC METABOLIC PANEL
Anion gap: 11 (ref 5–15)
BUN: 10 mg/dL (ref 6–20)
CO2: 22 mmol/L (ref 22–32)
Calcium: 8.9 mg/dL (ref 8.9–10.3)
Chloride: 106 mmol/L (ref 98–111)
Creatinine, Ser: 0.82 mg/dL (ref 0.44–1.00)
GFR calc Af Amer: >60 mL/min (ref >60)
GFR calc non Af Amer: >60 mL/min (ref >60)
Glucose, Bld: 103 mg/dL — ABNORMAL HIGH (ref 70–99)
Potassium: 4.1 mmol/L (ref 3.5–5.1)
Sodium: 139 mmol/L (ref 135–145)

## 2020-06-23 LAB — CBC
HCT: 39.1 % (ref 36.0–46.0)
Hemoglobin: 11.9 g/dL — ABNORMAL LOW (ref 12.0–15.0)
MCH: 27 pg (ref 26.0–34.0)
MCHC: 30.4 g/dL (ref 30.0–36.0)
MCV: 88.7 fL (ref 80.0–100.0)
Platelets: 242 10*3/uL (ref 150–400)
RBC: 4.41 MIL/uL (ref 3.87–5.11)
RDW: 14.9 % (ref 11.5–15.5)
WBC: 5.8 10*3/uL (ref 4.0–10.5)
nRBC: 0 % (ref 0.0–0.2)

## 2020-06-23 LAB — SEDIMENTATION RATE: Sed Rate: 5 mm/hr (ref 0–22)

## 2020-06-23 LAB — C-REACTIVE PROTEIN: CRP: 0.6 mg/dL (ref <1.0)

## 2020-06-23 SURGERY — ECHOCARDIOGRAM, TRANSESOPHAGEAL
Anesthesia: Monitor Anesthesia Care

## 2020-06-23 SURGERY — LOOP RECORDER INSERTION

## 2020-06-23 MED ORDER — SODIUM CHLORIDE 0.9 % IV SOLN: INTRAVENOUS | Status: DC | PRN

## 2020-06-23 MED ORDER — SODIUM CHLORIDE 0.9 % IV SOLN: INTRAVENOUS | Status: DC

## 2020-06-23 MED ORDER — LIDOCAINE-EPINEPHRINE 1 %-1:100000 IJ SOLN
INTRAMUSCULAR | Status: AC
  Filled 2020-06-23: qty 1

## 2020-06-23 MED ORDER — PROPOFOL 500 MG/50ML IV EMUL
INTRAVENOUS | Status: DC | PRN
  Administered 2020-06-23: 130 ug/kg/min via INTRAVENOUS
  Administered 2020-06-23: 100 ug/kg/min via INTRAVENOUS

## 2020-06-23 MED ORDER — PROPOFOL 10 MG/ML IV BOLUS
INTRAVENOUS | Status: DC | PRN
  Administered 2020-06-23 (×4): 10 mg via INTRAVENOUS

## 2020-06-23 MED ORDER — PANTOPRAZOLE SODIUM 40 MG PO TBEC: 40.0000 mg | DELAYED_RELEASE_TABLET | Freq: Every day | ORAL | Status: DC

## 2020-06-23 MED ORDER — ASPIRIN 81 MG PO TBEC: 81.0000 mg | DELAYED_RELEASE_TABLET | Freq: Every day | ORAL | 11 refills | Status: AC

## 2020-06-23 MED ORDER — CLOPIDOGREL BISULFATE 75 MG PO TABS: 75.0000 mg | ORAL_TABLET | Freq: Every day | ORAL | 0 refills | Status: AC

## 2020-06-23 MED ORDER — LIDOCAINE-EPINEPHRINE 1 %-1:100000 IJ SOLN
INTRAMUSCULAR | Status: DC | PRN
  Administered 2020-06-23: 20 mL

## 2020-06-23 MED FILL — CLOPIDOGREL 75 MG TABLET: 75 | 21 days supply | Qty: 21 | Fill #0

## 2020-06-23 SURGICAL SUPPLY — 2 items
MONITOR REVEAL LINQ II (Prosthesis & Implant Heart) ×2 IMPLANT
PACK LOOP INSERTION (CUSTOM PROCEDURE TRAY) ×3 IMPLANT

## 2020-06-23 NOTE — Progress Notes (Addendum)
  Echocardiogram Echocardiogram Transesophageal has been performed.  [name] 06/23/2020, 12:13 PM

## 2020-06-23 NOTE — Anesthesia Procedure Notes (Signed)
Procedure Name: MAC Date/Time: 06/23/2020 11:10 AM Performed by: Harden Mo, CRNA Pre-anesthesia Checklist: Patient identified, Emergency Drugs available, Suction available and Patient being monitored Patient Re-evaluated:Patient Re-evaluated prior to induction Oxygen Delivery Method: Nasal cannula Preoxygenation: Pre-oxygenation with 100% oxygen Induction Type: IV induction Placement Confirmation: positive ETCO2 and breath sounds checked- equal and bilateral Dental Injury: Teeth and Oropharynx as per pre-operative assessment

## 2020-06-23 NOTE — TOC Transition Note (Signed)
Transition of Care [facility]) - CM/SW Discharge Note   Patient Details  Name: [name] MRN: [id number] Date of Birth: January 09, 1961  Transition of [facility]) CM/SW Contact:  Pollie Friar, RN Phone Number: 06/23/2020, 4:22 PM   Clinical Narrative:    Pt discharging home with self care. No f/u per PT/OT and no DME needs.  Medications for home to be delivered to the room per [place].  Pt has transport home.   Final next level of care: Home/Self Care Barriers to Discharge: No Barriers Identified   Patient Goals and CMS Choice        Discharge Placement                       Discharge Plan and Services                                     Social Determinants of Health (SDOH) Interventions     Readmission Risk Interventions No flowsheet data found.

## 2020-06-23 NOTE — Discharge Summary (Addendum)
Stroke Discharge Summary  Patient ID: [name]   MRN: [id number]      DOB: December 09, 1960  Date of Admission: 06/21/2020 Date of Discharge: 06/23/2020  Attending Physician:  Rosalin Hawking, MD, Stroke MD Consultant(s):   Will Curt Bears, MD (EP-loop), [name], MD (cardiology - TEE) Patient's PCP:  Crist Infante, MD  DISCHARGE DIAGNOSIS:  Principal Problem:   Embolic cerebral infarction [facility]) s/p tPA, source unknown  Active Problems:   Hypothyroidism   Hyperlipidemia LDL goal <70   GERD   Essential hypertension   Prediabetes   Family hx-stroke   Restless legs syndrome (RLS)   Allergies as of 06/23/2020   No Known Allergies     Medication List    STOP taking these medications   ibuprofen 200 MG tablet Commonly known as: ADVIL     TAKE these medications   acyclovir 400 MG tablet Commonly known as: ZOVIRAX Take 1 tablet (400 mg total) by mouth 2 (two) times daily. What changed:   when to take this  reasons to take this   Aimovig 140 MG/ML Soaj Generic drug: Erenumab-aooe Inject 140 mg into the skin every 30 (thirty) days.   aspirin 81 MG EC tablet Take 1 tablet (81 mg total) by mouth daily. Swallow whole. Start taking on: June 24, 2020   chlorthalidone 25 MG tablet Commonly known as: HYGROTON Take 25 mg by mouth daily.   clopidogrel 75 MG tablet Commonly known as: PLAVIX Take 1 tablet (75 mg total) by mouth daily for 21 days. Start taking on: June 24, 2020   DULoxetine 30 MG capsule Commonly known as: CYMBALTA Take 30 mg by mouth every evening.   estrogens (conjugated) 0.625 MG tablet Commonly known as: Premarin TAKE 1 TABLET (0.625 MG TOTAL) BY MOUTH DAILY. What changed:   how much to take  how to take this  when to take this  additional instructions   levothyroxine 75 MCG tablet Commonly known as: SYNTHROID Take 37.5-75 mcg by mouth See admin instructions. 75 mcg on all days except Tuesday patient takes 37.5 mcg ( 1/2 of 75 mcg  tablet)   losartan 100 MG tablet Commonly known as: COZAAR Take 100 mg by mouth every evening.   Melatonin 5 MG Caps Take 5 mg by mouth at bedtime.   multivitamin capsule Take 1 capsule by mouth daily.   omeprazole 40 MG capsule Commonly known as: PRILOSEC Take 1 capsule (40 mg total) by mouth 2 (two) times daily.   pramipexole 0.125 MG tablet Commonly known as: MIRAPEX Take 1 tablet by mouth at bedtime.   rosuvastatin 10 MG tablet Commonly known as: CRESTOR Take 10 mg by mouth every evening.   vitamin B-12 100 MCG tablet Commonly known as: CYANOCOBALAMIN Take 100 mcg by mouth daily.   Vitamin D 50 MCG (2000 UT) tablet Take 2,000 Units by mouth 2 (two) times daily.       LABORATORY STUDIES CBC    Component Value Date/Time   WBC 5.8 06/23/2020 0510   RBC 4.41 06/23/2020 0510   HGB 11.9 (L) 06/23/2020 0510   HCT 39.1 06/23/2020 0510   PLT 242 06/23/2020 0510   MCV 88.7 06/23/2020 0510   MCH 27.0 06/23/2020 0510   MCHC 30.4 06/23/2020 0510   RDW 14.9 06/23/2020 0510   LYMPHSABS 2.7 06/21/2020 2151   MONOABS 0.5 06/21/2020 2151   EOSABS 0.2 06/21/2020 2151   BASOSABS 0.1 06/21/2020 2151   CMP    Component Value Date/Time  NA 139 06/23/2020 0510   K 4.1 06/23/2020 0510   CL 106 06/23/2020 0510   CO2 22 06/23/2020 0510   GLUCOSE 103 (H) 06/23/2020 0510   BUN 10 06/23/2020 0510   CREATININE 0.82 06/23/2020 0510   CALCIUM 8.9 06/23/2020 0510   PROT 6.7 06/21/2020 2151   ALBUMIN 3.5 06/21/2020 2151   AST 24 06/21/2020 2151   ALT 23 06/21/2020 2151   ALKPHOS 44 06/21/2020 2151   BILITOT 0.2 (L) 06/21/2020 2151   GFRNONAA >60 06/23/2020 0510   GFRAA >60 06/23/2020 0510   COAGS Lab Results  Component Value Date   INR 0.9 06/21/2020   Lipid Panel    Component Value Date/Time   CHOL 140 06/22/2020 0309   TRIG 111 06/22/2020 0309   HDL 53 06/22/2020 0309   CHOLHDL 2.6 06/22/2020 0309   VLDL 22 06/22/2020 0309   LDLCALC 65 06/22/2020 0309    HgbA1C  Lab Results  Component Value Date   HGBA1C 6.5 (H) 06/22/2020   Urinalysis    Component Value Date/Time   COLORURINE STRAW (A) 06/22/2020 0004   APPEARANCEUR CLEAR 06/22/2020 0004   LABSPEC 1.036 (H) 06/22/2020 0004   PHURINE 7.0 06/22/2020 0004   GLUCOSEU NEGATIVE 06/22/2020 0004   HGBUR NEGATIVE 06/22/2020 0004   BILIRUBINUR NEGATIVE 06/22/2020 0004   KETONESUR NEGATIVE 06/22/2020 0004   PROTEINUR NEGATIVE 06/22/2020 0004   NITRITE NEGATIVE 06/22/2020 0004   LEUKOCYTESUR NEGATIVE 06/22/2020 0004   Urine Drug Screen     Component Value Date/Time   LABOPIA NONE DETECTED 06/22/2020 0004   COCAINSCRNUR NONE DETECTED 06/22/2020 0004   LABBENZ NONE DETECTED 06/22/2020 0004   AMPHETMU NONE DETECTED 06/22/2020 0004   THCU NONE DETECTED 06/22/2020 0004   LABBARB NONE DETECTED 06/22/2020 0004    Alcohol Level    Component Value Date/Time   ETH <10 06/21/2020 2151     SIGNIFICANT DIAGNOSTIC STUDIES CT Code Stroke CTA Head W/WO contrast  Result Date: 06/21/2020 CLINICAL DATA:  59 year old female code stroke presentation. EXAM: CT ANGIOGRAPHY HEAD AND NECK TECHNIQUE: Multidetector CT imaging of the head and neck was performed using the standard protocol during bolus administration of intravenous contrast. Multiplanar CT image reconstructions and MIPs were obtained to evaluate the vascular anatomy. Carotid stenosis measurements (when applicable) are obtained utilizing NASCET criteria, using the distal internal carotid diameter as the denominator. CONTRAST:  27mL OMNIPAQUE IOHEXOL 350 MG/ML SOLN COMPARISON:  Head CT without contrast 2206 hours today. FINDINGS: CTA NECK Skeleton: Cervical spine disc and endplate degeneration. No acute osseous abnormality identified. Upper chest: Negative. Other neck: Motion artifact at the oropharynx.  Otherwise negative. Aortic arch: 3 vessel arch configuration.  No arch atherosclerosis. Right carotid system: Minimal calcified plaque at the  right ICA origin with no stenosis. Left carotid system: Mildly tortuous left CCA.  Otherwise negative. Vertebral arteries: Normal proximal right subclavian artery and right vertebral artery origin. Non dominant right vertebral artery is patent to the skull base without plaque or stenosis. Normal proximal left subclavian artery. The proximal left vertebral artery is obscured by dense paravertebral venous contrast, but from the visible left V2 segment onward the vessel appears normal to the skull base. CTA HEAD Posterior circulation: Dominant left V4 segment. Normal PICA origins. No distal vertebral stenosis. The right vertebral is diminutive beyond PICA but patent to the vertebrobasilar junction. Mildly fenestrated proximal basilar artery (normal variant). Patent basilar artery without stenosis. Normal SCA origins. Fetal type bilateral PCA origins. Bilateral PCA branches are within normal  limits. Both posterior communicating arteries are tortuous. Anterior circulation: Both ICA siphons are patent. Normal ophthalmic and posterior communicating artery origins. No siphon plaque or stenosis. Patent carotid termini, normal MCA and ACA origins. Diminutive or absent anterior communicating artery. Bilateral ACA branches are within normal limits. Left MCA M1 segment and bifurcation are patent without stenosis. Left MCA branches are within normal limits. Right MCA M1 segment and bifurcation are patent without stenosis. Right MCA branches are within normal limits. Venous sinuses: Patent. Anatomic variants: Dominant left vertebral artery. Fenestrated proximal basilar artery. Bilateral fetal PCA origins. Review of the MIP images confirms the above findings IMPRESSION: Negative for large vessel occlusion, and minimal atherosclerosis with no stenosis in the head or neck. These results were communicated to Dr. Rory Percy at 10:34 pm on 06/21/2020 by text page via the [facility] messaging system. Electronically Signed   By: Genevie Ann M.D.   On:  06/21/2020 22:35   CT Code Stroke CTA Neck W/WO contrast  Result Date: 06/21/2020 CLINICAL DATA:  59 year old female code stroke presentation. EXAM: CT ANGIOGRAPHY HEAD AND NECK TECHNIQUE: Multidetector CT imaging of the head and neck was performed using the standard protocol during bolus administration of intravenous contrast. Multiplanar CT image reconstructions and MIPs were obtained to evaluate the vascular anatomy. Carotid stenosis measurements (when applicable) are obtained utilizing NASCET criteria, using the distal internal carotid diameter as the denominator. CONTRAST:  38mL OMNIPAQUE IOHEXOL 350 MG/ML SOLN COMPARISON:  Head CT without contrast 2206 hours today. FINDINGS: CTA NECK Skeleton: Cervical spine disc and endplate degeneration. No acute osseous abnormality identified. Upper chest: Negative. Other neck: Motion artifact at the oropharynx.  Otherwise negative. Aortic arch: 3 vessel arch configuration.  No arch atherosclerosis. Right carotid system: Minimal calcified plaque at the right ICA origin with no stenosis. Left carotid system: Mildly tortuous left CCA.  Otherwise negative. Vertebral arteries: Normal proximal right subclavian artery and right vertebral artery origin. Non dominant right vertebral artery is patent to the skull base without plaque or stenosis. Normal proximal left subclavian artery. The proximal left vertebral artery is obscured by dense paravertebral venous contrast, but from the visible left V2 segment onward the vessel appears normal to the skull base. CTA HEAD Posterior circulation: Dominant left V4 segment. Normal PICA origins. No distal vertebral stenosis. The right vertebral is diminutive beyond PICA but patent to the vertebrobasilar junction. Mildly fenestrated proximal basilar artery (normal variant). Patent basilar artery without stenosis. Normal SCA origins. Fetal type bilateral PCA origins. Bilateral PCA branches are within normal limits. Both posterior  communicating arteries are tortuous. Anterior circulation: Both ICA siphons are patent. Normal ophthalmic and posterior communicating artery origins. No siphon plaque or stenosis. Patent carotid termini, normal MCA and ACA origins. Diminutive or absent anterior communicating artery. Bilateral ACA branches are within normal limits. Left MCA M1 segment and bifurcation are patent without stenosis. Left MCA branches are within normal limits. Right MCA M1 segment and bifurcation are patent without stenosis. Right MCA branches are within normal limits. Venous sinuses: Patent. Anatomic variants: Dominant left vertebral artery. Fenestrated proximal basilar artery. Bilateral fetal PCA origins. Review of the MIP images confirms the above findings IMPRESSION: Negative for large vessel occlusion, and minimal atherosclerosis with no stenosis in the head or neck. These results were communicated to Dr. Rory Percy at 10:34 pm on 06/21/2020 by text page via the [facility] messaging system. Electronically Signed   By: Genevie Ann M.D.   On: 06/21/2020 22:35   MR BRAIN WO CONTRAST  Result Date:  06/22/2020 CLINICAL DATA:  Follow-up examination for acute stroke. EXAM: MRI HEAD WITHOUT CONTRAST TECHNIQUE: Multiplanar, multiecho pulse sequences of the brain and surrounding structures were obtained without intravenous contrast. COMPARISON:  Prior CTs from 06/21/2020. FINDINGS: Brain: Examination technically limited as the patient was unable to tolerate the full length of the exam. Cerebral volume within normal limits for age. No significant cerebral white matter disease. Small remote cortical/subcortical infarct noted at the posterior right parietal lobe (series 11, image 19). Patchy small volume restricted diffusion seen involving the right parietal lobe, with involvement of the postcentral gyrus (series 5, images 90-83). No associated mass effect. Evaluation for associated hemorrhage as SWI sequence was not performed. No other evidence for acute or  subacute ischemia. Gray-white matter differentiation otherwise maintained. No mass lesion, mass effect, or midline shift. No hydrocephalus or extra-axial fluid collection. Pituitary gland suprasellar region normal. Midline structures intact. Vascular: Major intracranial vascular flow voids are maintained. Skull and upper cervical spine: Craniocervical junction within normal limits. Bone marrow signal intensity normal. No scalp soft tissue abnormality. Sinuses/Orbits: Globes and orbital soft tissues within normal limits. Paranasal sinuses are clear. No significant mastoid effusion. Inner ear structures grossly normal. Other: None. IMPRESSION: 1. Patchy small volume acute ischemic nonhemorrhagic infarct involving the right parietal lobe, with involvement of the postcentral gyrus. No associated mass effect. 2. Small remote cortical/subcortical infarct involving the posterior right parietal lobe. 3. Otherwise negative brain MRI. Please note examination is somewhat technically limited as the patient was unable to tolerate the full length of the exam. Electronically Signed   By: Jeannine Boga M.D.   On: 06/22/2020 22:37   EP PPM/ICD IMPLANT  Result Date: 06/23/2020 SURGEON:  Allegra Lai, MD   PREPROCEDURE DIAGNOSIS:  Cryptogenic Stroke   POSTPROCEDURE DIAGNOSIS:  Cryptogenic Stroke    PROCEDURES:  1. Implantable loop recorder implantation   INTRODUCTION:  [name] is a 59 y.o. female with a history of unexplained stroke who presents today for implantable loop implantation.  The patient has had a cryptogenic stroke.  Despite an extensive workup by neurology, no reversible causes have been identified.  she has worn telemetry during which she did not have arrhythmias.  There is significant concern for possible atrial fibrillation as the cause for the patients stroke.  The patient therefore presents today for implantable loop implantation.   DESCRIPTION OF PROCEDURE:  Informed written consent was obtained,  and the patient was brought to the electrophysiology lab in a fasting state.  The patient required no sedation for the procedure today.  Mapping over the patient's chest was performed by the EP lab staff to identify the area where electrograms were most prominent for ILR recording.  This area was found to be the left parasternal region over the 3rd-4th intercostal space. The patients left chest was therefore prepped and draped in the usual sterile fashion by the EP lab staff. The skin overlying the left parasternal region was infiltrated with lidocaine for local analgesia.  A 0.5-cm incision was made over the left parasternal region over the 3rd intercostal space.  A subcutaneous ILR pocket was fashioned using a combination of sharp and blunt dissection.  A Medtronic Reveal Linq model [place] [id number] implantable loop recorder was then placed into the pocket  R waves were very prominent and measured 0.3mV. EBL<1 ml.  Steri- Strips and a sterile dressing were then applied.  There were no early apparent complications.   CONCLUSIONS:  1. Successful implantation of a Medtronic Reveal LINQ implantable  loop recorder for cryptogenic stroke  2. No early apparent complications.   ECHOCARDIOGRAM COMPLETE  Result Date: 06/22/2020    ECHOCARDIOGRAM REPORT   Patient Name:   [name] Date of Exam: 06/22/2020 Medical Rec #:  [id number]     Height:       65.0 in Accession #:    [id number]    Weight:       205.9 lb Date of Birth:  Dec 07, 1960    BSA:          2.003 m Patient Age:    65 years      BP:           127/90 mmHg Patient Gender: F             HR:           83 bpm. Exam Location:  Inpatient Procedure: 2D Echo, Cardiac Doppler and Color Doppler Indications:    Stroke 434.91 / I163.9  History:        Patient has no prior history of Echocardiogram examinations.                 Risk Factors:Hypertension, Diabetes, Dyslipidemia and GERD.  Sonographer:    Jonelle Sidle Dance Referring Phys: [id number] ASHISH ARORA IMPRESSIONS  1.  Left ventricular ejection fraction, by estimation, is 60 to 65%. The left ventricle has normal function. The left ventricle has no regional wall motion abnormalities. There is mild left ventricular hypertrophy. Left ventricular diastolic parameters were normal.  2. Right ventricular systolic function is normal. The right ventricular size is normal. There is normal pulmonary artery systolic pressure. The estimated right ventricular systolic pressure is 48.1 mmHg.  3. The mitral valve is normal in structure. Trivial mitral valve regurgitation.  4. The aortic valve is tricuspid. Aortic valve regurgitation is not visualized.  5. The inferior vena cava is normal in size with greater than 50% respiratory variability, suggesting right atrial pressure of 3 mmHg. FINDINGS  Left Ventricle: Left ventricular ejection fraction, by estimation, is 60 to 65%. The left ventricle has normal function. The left ventricle has no regional wall motion abnormalities. The left ventricular internal cavity size was normal in size. There is  mild left ventricular hypertrophy. Left ventricular diastolic parameters were normal. Right Ventricle: The right ventricular size is normal. Right vetricular wall thickness was not assessed. Right ventricular systolic function is normal. There is normal pulmonary artery systolic pressure. The tricuspid regurgitant velocity is 2.06 m/s, and with an assumed right atrial pressure of 3 mmHg, the estimated right ventricular systolic pressure is 85.6 mmHg. Left Atrium: Left atrial size was normal in size. Right Atrium: Right atrial size was normal in size. Pericardium: There is no evidence of pericardial effusion. Mitral Valve: The mitral valve is normal in structure. Trivial mitral valve regurgitation. Tricuspid Valve: The tricuspid valve is normal in structure. Tricuspid valve regurgitation is trivial. Aortic Valve: The aortic valve is tricuspid. Aortic valve regurgitation is not visualized. Pulmonic Valve:  The pulmonic valve was not well visualized. Pulmonic valve regurgitation is not visualized. Aorta: The aortic root and ascending aorta are structurally normal, with no evidence of dilitation. Venous: The inferior vena cava is normal in size with greater than 50% respiratory variability, suggesting right atrial pressure of 3 mmHg. IAS/Shunts: The interatrial septum was not well visualized.  LEFT VENTRICLE PLAX 2D LVIDd:         4.20 cm  Diastology LVIDs:         2.90 cm  LV e' lateral:   11.60 cm/s LV PW:         1.30 cm  LV E/e' lateral: 6.9 LV IVS:        0.90 cm  LV e' medial:    8.64 cm/s LVOT diam:     1.90 cm  LV E/e' medial:  9.3 LV SV:         54 LV SV Index:   27 LVOT Area:     2.84 cm  RIGHT VENTRICLE             IVC RV Basal diam:  2.80 cm     IVC diam: 1.40 cm RV S prime:     12.70 cm/s TAPSE (M-mode): 2.4 cm LEFT ATRIUM             Index       RIGHT ATRIUM           Index LA diam:        3.60 cm 1.80 cm/m  RA Area:     13.80 cm LA Vol (A2C):   50.1 ml 25.02 ml/m RA Volume:   32.00 ml  15.98 ml/m LA Vol (A4C):   55.6 ml 27.76 ml/m LA Biplane Vol: 53.8 ml 26.86 ml/m  AORTIC VALVE LVOT Vmax:   87.90 cm/s LVOT Vmean:  62.200 cm/s LVOT VTI:    0.192 m  AORTA Ao Root diam: 3.10 cm Ao Asc diam:  2.90 cm MITRAL VALVE               TRICUSPID VALVE MV Area (PHT): 2.76 cm    TR Peak grad:   17.0 mmHg MV Decel Time: 275 msec    TR Vmax:        206.00 cm/s MV E velocity: 80.30 cm/s MV A velocity: 94.50 cm/s  SHUNTS MV E/A ratio:  0.85        Systemic VTI:  0.19 m                            Systemic Diam: 1.90 cm [name] MD Electronically signed by [name] MD Signature Date/Time: 06/22/2020/11:57:52 AM    Final    ECHO TEE  Result Date: 06/23/2020    TRANSESOPHOGEAL ECHO REPORT   Patient Name:   [name] Date of Exam: 06/23/2020 Medical Rec #:  [id number]     Height:       65.0 in Accession #:    [id number]    Weight:       205.9 lb Date of Birth:  02-13-1961    BSA:           2.003 m Patient Age:    42 years      BP:           118/70 mmHg Patient Gender: F             HR:           82 bpm. Exam Location:  Inpatient Procedure: Transesophageal Echo, 3D Echo, Cardiac Doppler, Color Doppler and            Saline Contrast Bubble Study Indications:     Stroke 434.91 / I163.9  History:         Patient has prior history of Echocardiogram examinations, most                  recent 06/22/2020. Risk Factors:Hypertension, Diabetes,  Dyslipidemia and GERD.  Sonographer:     Tiffany Dance Referring Phys:  [id number] Abigail Butts Diagnosing Phys: [name] MD PROCEDURE: The transesophogeal probe was passed without difficulty through the esophogus of the patient. Sedation performed by different physician. The patient was monitored while under deep sedation. Anesthestetic sedation was provided intravenously by Anesthesiology: 326.27mg  of Propofol. The patient developed no complications during the procedure. IMPRESSIONS  1. Left ventricular ejection fraction, by estimation, is 60 to 65%. The left ventricle has normal function. The left ventricle has no regional wall motion abnormalities. There is mild left ventricular hypertrophy.  2. Right ventricular systolic function is normal. The right ventricular size is normal.  3. No left atrial/left atrial appendage thrombus was detected.  4. The mitral valve is normal in structure. Mild mitral valve regurgitation.  5. The aortic valve is tricuspid. Aortic valve regurgitation is not visualized. No aortic stenosis is present.  6. Given history of distal esophageal stricture, study was conducted in upper to mid esophagus. Transgastric views were not obtained  7. Agitated saline contrast bubble study was negative, with no evidence of any interatrial shunt. FINDINGS  Left Ventricle: Left ventricular ejection fraction, by estimation, is 60 to 65%. The left ventricle has normal function. The left ventricle has no regional wall motion  abnormalities. The left ventricular internal cavity size was normal in size. There is  mild left ventricular hypertrophy. Right Ventricle: The right ventricular size is normal. Right vetricular wall thickness was not assessed. Right ventricular systolic function is normal. Left Atrium: Left atrial size was normal in size. No left atrial/left atrial appendage thrombus was detected. Right Atrium: Right atrial size was normal in size. Pericardium: There is no evidence of pericardial effusion. Mitral Valve: The mitral valve is normal in structure. Mild mitral valve regurgitation. Tricuspid Valve: The tricuspid valve is normal in structure. Tricuspid valve regurgitation is trivial. Aortic Valve: The aortic valve is tricuspid. Aortic valve regurgitation is not visualized. No aortic stenosis is present. Pulmonic Valve: The pulmonic valve was grossly normal. Pulmonic valve regurgitation is not visualized. Aorta: The aortic root is normal in size and structure. There is mild (Grade II) plaque. IAS/Shunts: No atrial level shunt detected by color flow Doppler. Agitated saline contrast was given intravenously to evaluate for intracardiac shunting. Agitated saline contrast bubble study was negative, with no evidence of any interatrial shunt. [name] MD Electronically signed by [name] MD Signature Date/Time: 06/23/2020/1:36:48 PM    Final (Updated)    CT HEAD CODE STROKE WO CONTRAST  Result Date: 06/21/2020 CLINICAL DATA:  Code stroke. Initial evaluation for neuro deficit, stroke suspected. EXAM: CT HEAD WITHOUT CONTRAST TECHNIQUE: Contiguous axial images were obtained from the base of the skull through the vertex without intravenous contrast. COMPARISON:  Prior CT from 05/25/2005. FINDINGS: Brain: Cerebral volume within normal limits for patient age. No evidence for acute intracranial hemorrhage. No findings to suggest acute large vessel territory infarct. No mass lesion, midline shift, or mass  effect. Ventricles are normal in size without evidence for hydrocephalus. No extra-axial fluid collection identified. Vascular: No hyperdense vessel identified. Skull: Scalp soft tissues demonstrate no acute abnormality. Calvarium intact. Sinuses/Orbits: Globes and orbital soft tissues within normal limits. Visualized paranasal sinuses are clear. No mastoid effusion. ASPECTS [facility] Stroke Program Early CT Score) - Ganglionic level infarction (caudate, lentiform nuclei, internal capsule, insula, M1-M3 cortex): 7 - Supraganglionic infarction (M4-M6 cortex): 3 Total score (0-10 with 10 being normal): 10 IMPRESSION: 1. Negative head CT.  No acute  intracranial abnormality identified. 2. ASPECTS is 10. These results were communicated to Dr. Rory Percy at 10:13 [place] 7/28/2021by text page via the [facility] messaging system. Electronically Signed   By: Jeannine Boga M.D.   On: 06/21/2020 22:13   VAS [place] LOWER EXTREMITY VENOUS (DVT)  Result Date: 06/23/2020  Lower Venous DVTStudy Indications: Embolic stroke.  Risk Factors: None identified. Comparison Study: No prior studies. Performing Technologist: Oliver Hum RVT  Examination Guidelines: A complete evaluation includes B-mode imaging, spectral Doppler, color Doppler, and power Doppler as needed of all accessible portions of each vessel. Bilateral testing is considered an integral part of a complete examination. Limited examinations for reoccurring indications may be performed as noted. The reflux portion of the exam is performed with the patient in reverse Trendelenburg.  +---------+---------------+---------+-----------+----------+--------------+ RIGHT    CompressibilityPhasicitySpontaneityPropertiesThrombus Aging +---------+---------------+---------+-----------+----------+--------------+ CFV      Full           Yes      Yes                                 +---------+---------------+---------+-----------+----------+--------------+ SFJ      Full                                                         +---------+---------------+---------+-----------+----------+--------------+ FV Prox  Full                                                        +---------+---------------+---------+-----------+----------+--------------+ FV Mid   Full                                                        +---------+---------------+---------+-----------+----------+--------------+ FV DistalFull                                                        +---------+---------------+---------+-----------+----------+--------------+ PFV      Full                                                        +---------+---------------+---------+-----------+----------+--------------+ POP      Full           Yes      Yes                                 +---------+---------------+---------+-----------+----------+--------------+ PTV      Full                                                        +---------+---------------+---------+-----------+----------+--------------+  PERO     Full                                                        +---------+---------------+---------+-----------+----------+--------------+   +---------+---------------+---------+-----------+----------+--------------+ LEFT     CompressibilityPhasicitySpontaneityPropertiesThrombus Aging +---------+---------------+---------+-----------+----------+--------------+ CFV      Full           Yes      Yes                                 +---------+---------------+---------+-----------+----------+--------------+ SFJ      Full                                                        +---------+---------------+---------+-----------+----------+--------------+ FV Prox  Full                                                        +---------+---------------+---------+-----------+----------+--------------+ FV Mid   Full                                                         +---------+---------------+---------+-----------+----------+--------------+ FV DistalFull                                                        +---------+---------------+---------+-----------+----------+--------------+ PFV      Full                                                        +---------+---------------+---------+-----------+----------+--------------+ POP      Full           Yes      Yes                                 +---------+---------------+---------+-----------+----------+--------------+ PTV      Full                                                        +---------+---------------+---------+-----------+----------+--------------+ PERO     Full                                                        +---------+---------------+---------+-----------+----------+--------------+  Summary: RIGHT: - There is no evidence of deep vein thrombosis in the lower extremity.  - No cystic structure found in the popliteal fossa.  LEFT: - There is no evidence of deep vein thrombosis in the lower extremity.  - No cystic structure found in the popliteal fossa.  *See table(s) above for measurements and observations.    Preliminary       HISTORY OF PRESENT ILLNESS [name] is a 59 y.o. 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 She reports that she was at work all day and was normal.  She went home around 530 PM.  Around 7:30 PM she noted sudden onset of inability to use her left hand.  She could not lift her cell phone and felt clumsy with that hand.  It also felt numb.  She came to the ER via private vehicle.  She was evaluated in the ED and a code stroke was activated in the emergency room.  Denies any headaches.  Denies neck pain.  Denies any chest pain shortness of breath.  Denies visual symptoms.  Denies any speech difficulties or dysphagia. Denies any prior episodes similar to this in the past. Premorbid modified Rankin scale (mRS):0. IV tPA administered.    HOSPITAL COURSE Ms. [name] is a 59 y.o. female with history of hypertension, hyperlipidemia, hypothyroidism, right shoulder rotator cuff injury status post repair presenting with L arm and hand weakness and numbness. Received tPA 06/21/2020 at 2210.  Stroke:  patchy R MCA infarct s/p tPA in setting of previous R parietal infarct, embolic secondary to unknown source  Code Stroke CT head No acute abnormality. ASPECTS 10.     CTA head & neck Unremarkable   MRI  patchy R parietal infarct w/ post central gyrus involvement. Small old cortical/subcortical posterior R parietal lobe infarct.   2D Echo EF 60 to 65%  LE venous Doppler neg DVT  TEE No thrombus, vegetation, or mass seen.  Bubble study was negative for PFO.   Loop recorder placed 7/30  LDL 65  HgbA1c 6.5  UDS negative  Hypercoagulable work-up pending  No antithrombotic prior to admission, now on aspirin and plavix. Continue DAPT x 3 weeks then aspirin alone.   Therapy recommendations:  no therapy needs   Disposition:  Return home  Hypertension  Home meds:  Cozaar 100, Chlorthalidone 25  Stable  Long-term BP goal normotensive  Hyperlipidemia  Home meds:  crestor 10, resumed in hospital  LDL 65, goal < 70  Continue statin at discharge  PreDiabetes   HgbA1c 6.5, goal < 7.0  Diet control  Exercises  PCP follow-up  Other Stroke Risk Factors  Former Cigarette smoker, quit 17 yrs ago  ETOH use, alcohol level <10, advised to drink no more than 1 drink(s) a day  Obesity, Body mass index is 34.27 kg/m., hx weight loss surgery (balloon), recommend weight loss, diet and exercise as appropriate   Family hx stroke  (father)  On Oral estrogen - recommend to stop if agree from OB/GYN  Other Active Problems  Hypothyroid on synthroid   GERD on PPI  RLS on mirapex  Insomnia on melatonin  on cymbalta    DISCHARGE EXAM Blood pressure (!) 133/82, pulse 87, temperature  9 F (36.7 C), temperature source Oral, resp. rate 18, height 5\' 5"  (1.651 m), weight (!) 93.4 kg, last menstrual period 11/25/2005, SpO2 96 %. General - Well nourished, well developed, in no apparent distress.  Ophthalmologic - fundi not visualized due to noncooperation.  Cardiovascular - Regular rhythm and rate.  Mental Status -  Level of arousal and orientation to time, place, and person were intact. Language including expression, naming, repetition, comprehension was assessed and found intact. Attention span and concentration were normal. Fund of Knowledge was assessed and was intact.  Cranial Nerves II - XII - II - Visual field intact OU. III, IV, VI - Extraocular movements intact. V - Facial sensation intact bilaterally. VII - Facial movement intact bilaterally. VIII - Hearing & vestibular intact bilaterally. X - Palate elevates symmetrically. XI - Chin turning & shoulder shrug intact bilaterally. XII - Tongue protrusion intact.  Motor Strength - The patient's strength was normal in all extremities and pronator drift was absent.  Bulk was normal and fasciculations were absent.   Motor Tone - Muscle tone was assessed at the neck and appendages and was normal.  Reflexes - The patient's reflexes were symmetrical in all extremities and she had no pathological reflexes.  Sensory - Light touch, temperature/pinprick were assessed and were symmetrical except left fingertip decreased light touch sensation.  Coordination - The patient had normal movements in the hands and feet with no ataxia or dysmetria.  Tremor was absent.  Gait and Station - deferred.  Discharge Diet   Carb modified, thin  liquids  DISCHARGE PLAN  Disposition:  Return home  aspirin 81 mg daily and clopidogrel 75 mg daily for secondary stroke prevention for 3 weeks then aspirin alone.  Ongoing stroke risk factor control by Primary Care Physician at time of discharge  Follow-up PCP Crist Infante, MD in 2 weeks.  Follow-up in [facility] in 4 weeks, office to schedule an appointment.   Follow-up OB/GYN - recommend cessation of estrogen given acute stroke  Follow-up [facility] for implantable loop recorder wound check in 10-14 days, office to schedule an appointment.   Loop recorder to be monitored by [facility] for atrial fibrillation as source of stroke. If found, they will notify patient.   35 minutes were spent preparing discharge.  Rosalin Hawking, MD PhD Stroke Neurology 06/23/2020 7:07 PM

## 2020-06-23 NOTE — Anesthesia Postprocedure Evaluation (Signed)
Anesthesia Post Note  Patient: NEVE BRANSCOMB  Procedure(s) Performed: TRANSESOPHAGEAL ECHOCARDIOGRAM (TEE) (N/A ) BUBBLE STUDY     Patient location during evaluation: PACU Anesthesia Type: MAC Level of consciousness: awake and alert Pain management: pain level controlled Vital Signs Assessment: post-procedure vital signs reviewed and stable Respiratory status: spontaneous breathing, nonlabored ventilation and respiratory function stable Cardiovascular status: stable and blood pressure returned to baseline Postop Assessment: no apparent nausea or vomiting Anesthetic complications: no   No complications documented.  Last Vitals:  Vitals:   06/23/20 1151 06/23/20 1201  BP: (!) 132/83 (!) 139/71  Pulse: 70 66  Resp: 14 14  Temp:    SpO2: 100% 100%    Last Pain:  Vitals:   06/23/20 1201  TempSrc:   PainSc: 0-No pain                 Jaekwon Mcclune A.

## 2020-06-23 NOTE — Transfer of Care (Signed)
Immediate Anesthesia Transfer of Care Note  Patient: [name]  Procedure(s) Performed: TRANSESOPHAGEAL ECHOCARDIOGRAM (TEE) (N/A ) BUBBLE STUDY  Patient Location: Endoscopy Unit  Anesthesia Type:MAC  Level of Consciousness: awake and drowsy  Airway & Oxygen Therapy: Patient Spontanous Breathing  Post-op Assessment: Report given to RN, Post -op Vital signs reviewed and stable and Patient moving all extremities X 4  Post vital signs: Reviewed and stable  Last Vitals:  Vitals Value Taken Time  BP 115/67 06/23/20 1141  Temp    Pulse 75 06/23/20 1142  Resp 13 06/23/20 1142  SpO2 99 % 06/23/20 1142  Vitals shown include unvalidated device data.  Last Pain:  Vitals:   06/23/20 1030  TempSrc:   PainSc: 0-No pain         Complications: No complications documented.

## 2020-06-23 NOTE — Anesthesia Preprocedure Evaluation (Addendum)
Anesthesia Evaluation  Patient identified by MRN, date of birth, ID band Patient awake    Reviewed: Allergy & Precautions, NPO status , Patient's Chart, lab work & pertinent test results  Airway Mallampati: II  TM Distance: >3 FB Neck ROM: Full    Dental no notable dental hx. (+) Teeth Intact, Dental Advisory Given   Pulmonary pneumonia, resolved, former smoker,    Pulmonary exam normal breath sounds clear to auscultation       Cardiovascular hypertension, Pt. on medications Normal cardiovascular exam Rhythm:Regular Rate:Normal  EKG - NSR, RBBB pattern  TTE- essentially normal   Neuro/Psych  Headaches, CVA- thought to be embolic in nature  Neuromuscular disease CVA, Residual Symptoms negative psych ROS   GI/Hepatic Neg liver ROS, hiatal hernia, GERD  Medicated and Controlled,Hx/o esophageal stricture- has not had dilation in past   Endo/Other  Hypothyroidism Obesity Hyperlipidemia  Renal/GU negative Renal ROS  negative genitourinary   Musculoskeletal  (+) Fibromyalgia -  Abdominal (+) + obese,   Peds  Hematology  (+) anemia ,   Anesthesia Other Findings   Reproductive/Obstetrics                           Anesthesia Physical Anesthesia Plan  ASA: III  Anesthesia Plan: MAC   Post-op Pain Management:    Induction: Intravenous  PONV Risk Score and Plan: 2  Airway Management Planned: Natural Airway and Nasal Cannula  Additional Equipment:   Intra-op Plan:   Post-operative Plan:   Informed Consent: I have reviewed the patients History and Physical, chart, labs and discussed the procedure including the risks, benefits and alternatives for the proposed anesthesia with the patient or authorized representative who has indicated his/her understanding and acceptance.     Dental advisory given  Plan Discussed with: CRNA and Anesthesiologist  Anesthesia Plan Comments:          Anesthesia Quick Evaluation

## 2020-06-23 NOTE — Consult Note (Addendum)
ELECTROPHYSIOLOGY CONSULT NOTE  Patient ID: [name] MRN: [id number], DOB/AGE: 58-16-1962   Admit date: 06/21/2020 Date of Consult: 06/23/2020  Primary Physician: Crist Infante, MD Primary Cardiologist: Dr. Tamala Julian (very remotely) Reason for Consultation: Cryptogenic stroke ; recommendations regarding Implantable Loop Recorder, requested by Dr. Erlinda Hong  History of Present Illness [name] was admitted on 06/21/2020 with L arm numbness, stroke.    PMHx includes: HTN, HLD, hypothyroidism. She saw Dr. Tamala Julian "many years ago", can not recall why, denies any known cardiac history  Neurology notes: right brain infarct s/p tPA, embolic secondary to unknown source.  she has undergone workup for stroke including echocardiogram and carotid angio.  The patient has been monitored on telemetry which has demonstrated sinus rhythm with no arrhythmias outside of a very brief PAT.  Inpatient stroke work-up is to be completed with a TEE.   Echocardiogram this admission demonstrated  IMPRESSIONS  1. Left ventricular ejection fraction, by estimation, is 60 to 65%. The  left ventricle has normal function. The left ventricle has no regional  wall motion abnormalities. There is mild left ventricular hypertrophy.  Left ventricular diastolic parameters  were normal.   2. Right ventricular systolic function is normal. The right ventricular  size is normal. There is normal pulmonary artery systolic pressure. The  estimated right ventricular systolic pressure is 26.8 mmHg.   3. The mitral valve is normal in structure. Trivial mitral valve  regurgitation.   4. The aortic valve is tricuspid. Aortic valve regurgitation is not  visualized.   5. The inferior vena cava is normal in size with greater than 50%  respiratory variability, suggesting right atrial pressure of 3 mmHg    Lab work is reviewed.  Prior to admission, the patient denies chest pain, shortness of breath, dizziness, palpitations, or  syncope.  She is recovering from the stroke with plans to home at discharge.   Past Medical History:  Diagnosis Date   Abnormal Pap smear of cervix    Anemia 2007   due to fibroids   Duodenitis without hemorrhage    Esophageal stricture    Esophagitis    GERD (gastroesophageal reflux disease)    Hiatal hernia    Hyperlipidemia    Hypertension    Hypothyroidism    Leiomyoma of uterus, unspecified    fibroids   Pneumonia    aspiration pneumonia after balloon surgery for weight loss     Surgical History:  Past Surgical History:  Procedure Laterality Date   BICEPT TENODESIS Right 12/07/2019   Procedure: BICEPS TENODESIS;  Surgeon: Meredith Pel, MD;  Location: [facility];  Service: Orthopedics;  Laterality: Right;   CARPAL TUNNEL RELEASE  2014   right wrist   CHOLECYSTECTOMY     HYSTEROSCOPY W/ ENDOMETRIAL ABLATION  4/05   RECTOCELE REPAIR  2007   SHOULDER ARTHROSCOPY WITH OPEN ROTATOR CUFF REPAIR Right 12/07/2019   Procedure: RIGHT SHOULDER ARTHROSCOPY,   MINI OPEN SUBSCAPULARIS, AND SUPRASPINATUS REPAIR, OPEN BICEPS TENODESIS;  Surgeon: Meredith Pel, MD;  Location: [facility];  Service: Orthopedics;  Laterality: Right;   TONSILLECTOMY     as child   TOTAL VAGINAL HYSTERECTOMY  5/07   TUBAL LIGATION  1991   BILATERAL     Medications Prior to Admission  Medication Sig Dispense Refill Last Dose   acyclovir (ZOVIRAX) 400 MG tablet Take 1 tablet (400 mg total) by mouth 2 (two) times daily. (Patient taking differently: Take 400 mg by mouth  2 (two) times daily as needed (Fever blisters). ) 180 tablet 4 unk   AIMOVIG 140 MG/ML SOAJ Inject 140 mg into the skin every 30 (thirty) days.   06/15/2020   chlorthalidone (HYGROTON) 25 MG tablet Take 25 mg by mouth daily.   06/21/2020 at Unknown time   Cholecalciferol (VITAMIN D) 2000 units tablet Take 2,000 Units by mouth 2 (two) times daily.   06/21/2020 at Unknown time   DULoxetine (CYMBALTA) 30 MG  capsule Take 30 mg by mouth every evening.    06/20/2020 at Unknown time   estrogens, conjugated, (PREMARIN) 0.625 MG tablet TAKE 1 TABLET (0.625 MG TOTAL) BY MOUTH DAILY. (Patient taking differently: Take 0.625 mg by mouth daily. ) 90 tablet 4 06/21/2020 at Unknown time   ibuprofen (ADVIL) 200 MG tablet Take 600 mg by mouth daily as needed for moderate pain.   Past Month at Unknown time   levothyroxine (SYNTHROID, LEVOTHROID) 75 MCG tablet Take 37.5-75 mcg by mouth See admin instructions. 75 mcg on all days except Tuesday patient takes 37.5 mcg ( 1/2 of 75 mcg tablet)   06/21/2020 at Unknown time   losartan (COZAAR) 100 MG tablet Take 100 mg by mouth every evening.    06/20/2020 at Unknown time   Melatonin 5 MG CAPS Take 5 mg by mouth at bedtime.   06/20/2020 at Unknown time   Multiple Vitamin (MULTIVITAMIN) capsule Take 1 capsule by mouth daily.   06/21/2020 at Unknown time   omeprazole (PRILOSEC) 40 MG capsule Take 1 capsule (40 mg total) by mouth 2 (two) times daily. 180 capsule 3 06/21/2020 at Unknown time   pramipexole (MIRAPEX) 0.125 MG tablet Take 1 tablet by mouth at bedtime.  3 06/21/2020 at Unknown time   rosuvastatin (CRESTOR) 10 MG tablet Take 10 mg by mouth every evening.    06/20/2020 at Unknown time   vitamin B-12 (CYANOCOBALAMIN) 100 MCG tablet Take 100 mcg by mouth daily.   06/21/2020 at Unknown time    Inpatient Medications:   [MAR Hold] aspirin EC  81 mg Oral Daily   [MAR Hold] clopidogrel  75 mg Oral Daily   [MAR Hold] DULoxetine  30 mg Oral QPM   [MAR Hold] levothyroxine  37.5 mcg Oral Once per day on Tue   [facility] Hold] levothyroxine  75 mcg Oral Once per day on Sun Mon Wed Thu Fri Sat   [MAR Hold] multivitamin with minerals  1 tablet Oral Daily   [MAR Hold] pantoprazole (PROTONIX) IV  40 mg Intravenous QHS   [MAR Hold] pramipexole  0.125 mg Oral QHS   [MAR Hold] rosuvastatin  10 mg Oral QPM   [MAR Hold] vitamin B-12  100 mcg Oral Daily    Allergies: No Known Allergies  Social  History   Socioeconomic History   Marital status: Married    Spouse name: Not on file   Number of children: 3   Years of education: Not on file   Highest education level: Not on file  Occupational History   Occupation: Nurse  Tobacco Use   Smoking status: Former Smoker    Quit date: 12/23/2002    Years since quitting: 17.5   Smokeless tobacco: Never Used  Vaping Use   Vaping Use: Never used  Substance and Sexual Activity   Alcohol use: Not Currently   Drug use: No   Sexual activity: Yes    Partners: Male    Birth control/protection: Surgical    Comment: [facility]  Other Topics Concern  Not on file  Social History Narrative   Not on file   Social Determinants of Health   Financial Resource Strain:    Difficulty of Paying Living Expenses:   Food Insecurity:    Worried About Charity fundraiser in the Last Year:    Arboriculturist in the Last Year:   Transportation Needs:    Film/video editor (Medical):    Lack of Transportation (Non-Medical):   Physical Activity:    Days of Exercise per Week:    Minutes of Exercise per Session:   Stress:    Feeling of Stress :   Social Connections:    Frequency of Communication with Friends and Family:    Frequency of Social Gatherings with Friends and Family:    Attends Religious Services:    Active Member of Clubs or Organizations:    Attends Music therapist:    Marital Status:   Intimate Partner Violence:    Fear of Current or Ex-Partner:    Emotionally Abused:    Physically Abused:    Sexually Abused:      Family History  Problem Relation Age of Onset   Celiac disease Mother    Osteoporosis Mother    Heart disease Father    Stroke Father    Irritable bowel syndrome Sister    Lung cancer Maternal Grandmother    Breast cancer Paternal Grandmother    Heart disease Sister    Melanoma Sister    Colon cancer Neg Hx    Esophageal cancer Neg Hx    Stomach cancer Neg Hx    Rectal cancer Neg Hx        Review of Systems: All other systems reviewed and are otherwise negative except as noted above.  Physical Exam: Vitals:   06/22/20 2342 06/23/20 0401 06/23/20 0737 06/23/20 1030  BP: 126/75 124/81 (!) 129/95 (!) 158/90  Pulse: 63 75 73 75  Resp: 17 18 16 12   Temp: 98 F (36.7 C) 97.8 F (36.6 C) 98.2 F (36.8 C)   TempSrc: Oral Oral Oral   SpO2: 99% 99% 98% 98%  Weight:      Height:        GEN- The patient is well appearing, alert and oriented x 3 today.   Head- normocephalic, atraumatic Eyes-  Sclera clear, conjunctiva pink Ears- hearing intact Oropharynx- clear Neck- supple Lungs- CTA b/l, normal work of breathing Heart- RRR, no murmurs, rubs or gallops  GI- soft, NT, ND Extremities- no clubbing, cyanosis, or edema MS- no significant deformity or atrophy Skin- no rash or lesion Psych- euthymic mood, full affect   Labs:   Lab Results  Component Value Date   WBC 5.8 06/23/2020   HGB 11.9 (L) 06/23/2020   HCT 39.1 06/23/2020   MCV 88.7 06/23/2020   PLT 242 06/23/2020    Recent Labs  Lab 06/21/20 2151 06/21/20 2156 06/23/20 0510  NA 138   < > 139  K 2.9*   < > 4.1  CL 101   < > 106  CO2 27   < > 22  BUN 14   < > 10  CREATININE 1.01*   < > 0.82  CALCIUM 9.3   < > 8.9  PROT 6.7  --   --   BILITOT 0.2*  --   --   ALKPHOS 44  --   --   ALT 23  --   --   AST 24  --   --  GLUCOSE 155*   < > 103*   < > = values in this interval not displayed.   Lab Results  Component Value Date   TROPONINI <0.03 03/20/2019   Lab Results  Component Value Date   CHOL 140 06/22/2020   Lab Results  Component Value Date   HDL 53 06/22/2020   Lab Results  Component Value Date   LDLCALC 65 06/22/2020   Lab Results  Component Value Date   TRIG 111 06/22/2020   Lab Results  Component Value Date   CHOLHDL 2.6 06/22/2020   No results found for: LDLDIRECT  No results found for: DDIMER   Radiology/Studies:   CT Code Stroke CTA Head W/WO contrast Result  Date: 06/21/2020 CLINICAL DATA:  59 year old female code stroke presentation. EXAM: CT ANGIOGRAPHY HEAD AND NECK TECHNIQUE: Multidetector CT imaging of the head and neck was performed using the standard protocol during bolus administration of intravenous contrast. Multiplanar CT image reconstructions and MIPs were obtained to evaluate the vascular anatomy. Carotid stenosis measurements (when applicable) are obtained utilizing NASCET criteria, using the distal internal carotid diameter as the denominator. CONTRAST:  72mL OMNIPAQUE IOHEXOL 350 MG/ML SOLN COMPARISON:  Head CT without contrast 2206 hours today. FINDINGS: CTA NECK Skeleton: Cervical spine disc and endplate degeneration. No acute osseous abnormality identified. Upper chest: Negative. Other neck: Motion artifact at the oropharynx.  Otherwise negative. Aortic arch: 3 vessel arch configuration.  No arch atherosclerosis. Right carotid system: Minimal calcified plaque at the right ICA origin with no stenosis. Left carotid system: Mildly tortuous left CCA.  Otherwise negative. Vertebral arteries: Normal proximal right subclavian artery and right vertebral artery origin. Non dominant right vertebral artery is patent to the skull base without plaque or stenosis. Normal proximal left subclavian artery. The proximal left vertebral artery is obscured by dense paravertebral venous contrast, but from the visible left V2 segment onward the vessel appears normal to the skull base. CTA HEAD Posterior circulation: Dominant left V4 segment. Normal PICA origins. No distal vertebral stenosis. The right vertebral is diminutive beyond PICA but patent to the vertebrobasilar junction. Mildly fenestrated proximal basilar artery (normal variant). Patent basilar artery without stenosis. Normal SCA origins. Fetal type bilateral PCA origins. Bilateral PCA branches are within normal limits. Both posterior communicating arteries are tortuous. Anterior circulation: Both ICA siphons are  patent. Normal ophthalmic and posterior communicating artery origins. No siphon plaque or stenosis. Patent carotid termini, normal MCA and ACA origins. Diminutive or absent anterior communicating artery. Bilateral ACA branches are within normal limits. Left MCA M1 segment and bifurcation are patent without stenosis. Left MCA branches are within normal limits. Right MCA M1 segment and bifurcation are patent without stenosis. Right MCA branches are within normal limits. Venous sinuses: Patent. Anatomic variants: Dominant left vertebral artery. Fenestrated proximal basilar artery. Bilateral fetal PCA origins. Review of the MIP images confirms the above findings IMPRESSION: Negative for large vessel occlusion, and minimal atherosclerosis with no stenosis in the head or neck. These results were communicated to Dr. Rory Percy at 10:34 pm on 06/21/2020 by text page via the [facility] messaging system. Electronically Signed   By: Genevie Ann M.D.   On: 06/21/2020 22:35      MR BRAIN WO CONTRAST Result Date: 06/22/2020 CLINICAL DATA:  Follow-up examination for acute stroke. EXAM: MRI HEAD WITHOUT CONTRAST TECHNIQUE: Multiplanar, multiecho pulse sequences of the brain and surrounding structures were obtained without intravenous contrast. COMPARISON:  Prior CTs from 06/21/2020. FINDINGS: Brain: Examination technically limited as the patient was  unable to tolerate the full length of the exam. Cerebral volume within normal limits for age. No significant cerebral white matter disease. Small remote cortical/subcortical infarct noted at the posterior right parietal lobe (series 11, image 19). Patchy small volume restricted diffusion seen involving the right parietal lobe, with involvement of the postcentral gyrus (series 5, images 90-83). No associated mass effect. Evaluation for associated hemorrhage as SWI sequence was not performed. No other evidence for acute or subacute ischemia. Gray-white matter differentiation otherwise maintained.  No mass lesion, mass effect, or midline shift. No hydrocephalus or extra-axial fluid collection. Pituitary gland suprasellar region normal. Midline structures intact. Vascular: Major intracranial vascular flow voids are maintained. Skull and upper cervical spine: Craniocervical junction within normal limits. Bone marrow signal intensity normal. No scalp soft tissue abnormality. Sinuses/Orbits: Globes and orbital soft tissues within normal limits. Paranasal sinuses are clear. No significant mastoid effusion. Inner ear structures grossly normal. Other: None. IMPRESSION: 1. Patchy small volume acute ischemic nonhemorrhagic infarct involving the right parietal lobe, with involvement of the postcentral gyrus. No associated mass effect. 2. Small remote cortical/subcortical infarct involving the posterior right parietal lobe. 3. Otherwise negative brain MRI. Please note examination is somewhat technically limited as the patient was unable to tolerate the full length of the exam. Electronically Signed   By: Jeannine Boga M.D.   On: 06/22/2020 22:37     CT HEAD CODE STROKE WO CONTRAST Result Date: 06/21/2020 CLINICAL DATA:  Code stroke. Initial evaluation for neuro deficit, stroke suspected. EXAM: CT HEAD WITHOUT CONTRAST TECHNIQUE: Contiguous axial images were obtained from the base of the skull through the vertex without intravenous contrast. COMPARISON:  Prior CT from 05/25/2005. FINDINGS: Brain: Cerebral volume within normal limits for patient age. No evidence for acute intracranial hemorrhage. No findings to suggest acute large vessel territory infarct. No mass lesion, midline shift, or mass effect. Ventricles are normal in size without evidence for hydrocephalus. No extra-axial fluid collection identified. Vascular: No hyperdense vessel identified. Skull: Scalp soft tissues demonstrate no acute abnormality. Calvarium intact. Sinuses/Orbits: Globes and orbital soft tissues within normal limits. Visualized  paranasal sinuses are clear. No mastoid effusion. ASPECTS [facility] Stroke Program Early CT Score) - Ganglionic level infarction (caudate, lentiform nuclei, internal capsule, insula, M1-M3 cortex): 7 - Supraganglionic infarction (M4-M6 cortex): 3 Total score (0-10 with 10 being normal): 10 IMPRESSION: 1. Negative head CT.  No acute intracranial abnormality identified. 2. ASPECTS is 10. These results were communicated to Dr. Rory Percy at 10:13 [place] 7/28/2021by text page via the [facility] messaging system. Electronically Signed   By: Jeannine Boga M.D.   On: 06/21/2020 22:13    12-lead ECG SR, RBBB All prior EKG's in EPIC reviewed with no documented atrial fibrillation  Telemetry SR, one very brief PAT  Assessment and Plan:  1. Cryptogenic stroke The patient presents with cryptogenic stroke.  The patient has aTEE planned for this today.  I spoke at length with the patient about monitoring for afib with either a 30 day event monitor or an implantable loop recorder.  Risks, benefits, and alteratives to implantable loop recorder were discussed with the patient today.   At this time, the patient is very clear in her decision to proceed with implantable loop recorder.   Wound care was reviewed with the patient (keep incision clean and dry for 3 days).  Wound check Jaskiran Pata bescheduled for the patient  Please call with questions.   Renee Dyane Dustman, PA-C 06/23/2020  I have seen and examined this patient  with Tommye Standard.  Agree with above, note added to reflect my findings.  On exam, RRR,no murmurs.  Patient presented to the hospital with cryptogenic stroke. To date, no cause has been found. TEE planned for today. If unrevealing, Adyn Serna plan for LINQ monitor to look for atrial fibrillation. Risks and benefits discussed. Risks include but not limited to bleeding and infection. The patient understands the risks and has agreed to the procedure.  Nakiyah Beverley M. Latrel Szymczak MD 06/23/2020 12:33 PM

## 2020-06-23 NOTE — Discharge Instructions (Signed)
Loop implant site care ionstructions Keep incision clean and dry for 3 days. Once showering, no dot rub/scrub area, pat dry.  You can remove outer dressing tomorrow. Leave steri-strips (little pieces of tape) on until seen in the office for wound check appointment. Call the office [phone]) for redness, drainage, swelling, or fever.

## 2020-06-23 NOTE — CV Procedure (Signed)
     TRANSESOPHAGEAL ECHOCARDIOGRAM   NAME:  [name]   MRN: [id number] DOB:  07-18-1961   ADMIT DATE: 06/21/2020  INDICATIONS: CVA  PROCEDURE:   Informed consent was obtained prior to the procedure. The risks, benefits and alternatives for the procedure were discussed and the patient comprehended these risks.  Risks include, but are not limited to, cough, sore throat, vomiting, nausea, somnolence, esophageal and stomach trauma or perforation, bleeding, low blood pressure, aspiration, pneumonia, infection, trauma to the teeth and death.    After a procedural time-out, the oropharynx was anesthetized and the patient was sedated by the anesthesia service. The transesophageal probe was inserted in the esophagus and stomach without difficulty and multiple views were obtained. Anesthesia was monitored by Garrison Columbus, CRNA and Dr Royce Macadamia.    COMPLICATIONS:    There were no immediate complications.  FINDINGS:  No thrombus, vegetation, or mass seen.  Bubble study was negative for PFO.   [name] MD [facility]  [address], [place], [place] [address] [phone]   11:52 AM

## 2020-06-23 NOTE — Interval H&P Note (Signed)
History and Physical Interval Note:  06/23/2020 10:57 AM  [name]  has presented today for surgery, with the diagnosis of stoke.  The various methods of treatment have been discussed with the patient and family. After consideration of risks, benefits and other options for treatment, the patient has consented to  Procedure(s): TRANSESOPHAGEAL ECHOCARDIOGRAM (TEE) (N/A) as a surgical intervention.  The patient's history has been reviewed, patient examined, no change in status, stable for surgery.  I have reviewed the patient's chart and labs.  Questions were answered to the patient's satisfaction.    History of esophageal stricture.  From notes from GI in 2010, was having dysphagia and had distal esophageal stricture at that time.  No intervention was done at that time.  Repeat EGD in 2013 was normal.  She reports no issues with dysphagia for years.  Given no recent dysphagia and normal EGD in 2013, can proceed with TEE.  Discussed risks/benefits of procedure with patient and she is agreeable with proceeding.   Donato Heinz

## 2020-06-23 NOTE — Progress Notes (Signed)
Bilateral lower extremity venous duplex has been completed. Preliminary results can be found in CV Proc through chart review.   06/23/20 3:12 PM Carlos Levering RVT

## 2020-06-23 NOTE — Progress Notes (Signed)
Transported to Endo for TEE at this time.

## 2020-06-24 ENCOUNTER — Encounter: Payer: Self-pay | Admitting: Obstetrics & Gynecology

## 2020-06-24 LAB — HOMOCYSTEINE: Homocysteine: 8.5 umol/L (ref 0.0–14.5)

## 2020-06-24 LAB — LUPUS ANTICOAGULANT PANEL
DRVVT: 31.6 s (ref 0.0–47.0)
PTT Lupus Anticoagulant: 28.5 s (ref 0.0–51.9)

## 2020-06-24 LAB — BETA-2-GLYCOPROTEIN I ABS, IGG/M/A
Beta-2 Glyco I IgG: <9 GPI IgG units (ref 0–20)
Beta-2-Glycoprotein I IgA: <9 GPI IgA units (ref 0–25)
Beta-2-Glycoprotein I IgM: <9 GPI IgM units (ref 0–32)

## 2020-06-25 LAB — CARDIOLIPIN ANTIBODIES, IGG, IGM, IGA
Anticardiolipin IgA: <9 APL U/mL (ref 0–11)
Anticardiolipin IgG: <9 GPL U/mL (ref 0–14)
Anticardiolipin IgM: <9 MPL U/mL (ref 0–12)

## 2020-06-25 LAB — MPO/PR-3 (ANCA) ANTIBODIES
ANCA Proteinase 3: <3.5 U/mL (ref 0.0–3.5)
Myeloperoxidase Abs: <9 U/mL (ref 0.0–9.0)

## 2020-06-26 ENCOUNTER — Encounter (HOSPITAL_COMMUNITY): Payer: Self-pay | Admitting: Cardiology

## 2020-06-26 ENCOUNTER — Other Ambulatory Visit: Payer: Self-pay | Admitting: *Deleted

## 2020-06-26 ENCOUNTER — Telehealth: Payer: Self-pay | Admitting: Obstetrics & Gynecology

## 2020-06-26 LAB — ANTINUCLEAR ANTIBODIES, IFA: ANA Ab, IFA: NEGATIVE

## 2020-06-26 LAB — ANCA TITERS
Atypical P-ANCA titer: <1:20 {titer}
C-ANCA: <1:20 {titer}
P-ANCA: <1:20 {titer}

## 2020-06-26 MED FILL — PANTOPRAZOLE SOD DR 40 MG T: 40 | 30 days supply | Qty: 30 | Fill #0

## 2020-06-26 NOTE — Telephone Encounter (Signed)
Last AEX 03/09/20  Routing MyChart message to Dr. Sabra Heck to review and advise on Premarin.

## 2020-06-26 NOTE — Telephone Encounter (Signed)
[name], [name] [facility] Clinical Pool Hey Dr. Sabra Heck,  I had a stroke on 7/28 and was in the hospital for 2 days. I was able to get tPA, fortunately the only residual I have is numbness in my left fingers. I was advised by Dr. Erlinda Hong, Neurologist, to consult with you about my Premarin. Please review my hospital records and let me know what you think.  Thank you  [name]

## 2020-06-26 NOTE — Patient Outreach (Signed)
[facility]) Care Management  06/26/2020  [name] 10-27-1961 [id number]   Transition of care call/case closure   Referral received:06/22/20 Initial outreach:06/26/20 Insurance: UMR    Subjective: Initial successful telephone call to patient's preferred number in order to complete transition of care assessment; 2 HIPAA identifiers verified. Explained purpose of call and completed transition of care assessment.  [name] states that she is doing pretty good just a little tired. She discussed removing outer dressing at loop recorder site , steristrips in place no signs of infection. She reports still having just numbness in fingers of left hand, inpatient occupational therapy has provided her with exercises to complete. Reviewed stroke signs and symptom and action plan. Marland Kitchen  Spouse is  assisting with her  recovery. She discussed making contact with PCP office regarding problem with reflux awaiting return.  She denies any ongoing health issues and says she does not need a referral to one of the [place] chronic disease management programs.  She does  have the hospital indemnity and has made contact  She  uses a [facility] outpatient pharmacy.  Objective:  Mrs.[name]   was hospitalized at [facility] 7/29-7/30/21 for  CVA, right parietal infarct, s/p tpA, placement of loop recorde Comorbidities include: prediabetes obesity, hyperlipidemia  She was discharged to home 06/23/20 without the need for home health services or DME.   Assessment:  Patient voices good understanding of all discharge instructions.  See transition of care flowsheet for assessment details.   Plan:  Reviewed hospital discharge diagnosis of CVA, Loop recorder placement   and discharge treatment plan using hospital discharge instructions, assessing medication adherence, reviewing problems requiring provider notification, and discussing the importance of follow up with surgeon, primary care provider and/or  specialists as directed.  Reviewed [place] healthy lifestyle program information to receive discounted premium for  2022   Step 1: Get  your annual physical  Step 2: Complete your health assessment  Step 3:Identify your current health status and complete the corresponding action step between January 1, and July 26, 2020.      No ongoing care management needs identified so will close case to [facility] Management services and route successful outreach letter with [place] Management pamphlet and 24 Hour Nurse Line Magnet to [facility] [place] [facility] Management clinical pool to be mailed to patient's home address.  Thanked patient for their services to [facility].   Brooke Draft, RN, BSN  [facility] Management Coordinator  [phone]- Mobile [phone]- Toll Free Main Office

## 2020-06-27 NOTE — Telephone Encounter (Signed)
Please let her know I'm so sorry but glad she is doing really well.  She needs to stop the estrogen and should not restart it.  We should not sure estrogens with her in the future.  If has already stopped and is having symptoms, may need to consider other options that are non hormonal.  Thanks.

## 2020-06-27 NOTE — Telephone Encounter (Signed)
Spoke with patient, advised per Dr. Sabra Heck. Patient will stop Premarin. Medication discontinued in med list. Declines OV at this time. Patient will continue to monitor symptoms, will contact office for OV to discuss alternatives if having symptoms. Patient verbalizes understanding and is agreeable.  Encounter closed.

## 2020-07-03 DIAGNOSIS — R7301 Impaired fasting glucose: Secondary | ICD-10-CM | POA: Diagnosis not present

## 2020-07-03 DIAGNOSIS — K219 Gastro-esophageal reflux disease without esophagitis: Secondary | ICD-10-CM | POA: Diagnosis not present

## 2020-07-03 DIAGNOSIS — E785 Hyperlipidemia, unspecified: Secondary | ICD-10-CM | POA: Diagnosis not present

## 2020-07-03 DIAGNOSIS — I639 Cerebral infarction, unspecified: Secondary | ICD-10-CM | POA: Diagnosis not present

## 2020-07-03 DIAGNOSIS — I10 Essential (primary) hypertension: Secondary | ICD-10-CM | POA: Diagnosis not present

## 2020-07-06 ENCOUNTER — Other Ambulatory Visit: Payer: Self-pay

## 2020-07-06 ENCOUNTER — Ambulatory Visit (INDEPENDENT_AMBULATORY_CARE_PROVIDER_SITE_OTHER): Payer: 59 | Admitting: Emergency Medicine

## 2020-07-06 DIAGNOSIS — I63411 Cerebral infarction due to embolism of right middle cerebral artery: Secondary | ICD-10-CM

## 2020-07-06 LAB — CUP PACEART INCLINIC DEVICE CHECK
Date Time Interrogation Session: 20210812114655
Implantable Pulse Generator Implant Date: 20210730

## 2020-07-06 NOTE — Progress Notes (Signed)
ILR wound check in clinic. Steri strips removed. Wound well healed. Home monitor transmitting nightly. No episodes. Questions answered. Next remote 08/07/20.

## 2020-07-11 MED FILL — ROSUVASTATIN CALCIUM 10 MG: 10 | 90 days supply | Qty: 90 | Fill #2

## 2020-07-11 MED FILL — LOSARTAN POTASSIUM 100 MG T: 100 | 90 days supply | Qty: 90 | Fill #1

## 2020-07-11 MED FILL — AIMOVIG 140 MG/ML SOAJ: 140 | 30 days supply | Qty: 1 | Fill #1

## 2020-07-11 MED FILL — DULoxetine HCL 30 MG CPEP: 30 | 90 days supply | Qty: 90 | Fill #1

## 2020-07-18 ENCOUNTER — Ambulatory Visit: Payer: 59 | Admitting: Adult Health

## 2020-07-18 ENCOUNTER — Encounter: Payer: Self-pay | Admitting: Adult Health

## 2020-07-18 VITALS — BP 116/84 | HR 84 | Ht 65.0 in | Wt 206.4 lb

## 2020-07-18 DIAGNOSIS — N951 Menopausal and female climacteric states: Secondary | ICD-10-CM | POA: Diagnosis not present

## 2020-07-18 DIAGNOSIS — I63411 Cerebral infarction due to embolism of right middle cerebral artery: Secondary | ICD-10-CM

## 2020-07-18 DIAGNOSIS — I10 Essential (primary) hypertension: Secondary | ICD-10-CM | POA: Diagnosis not present

## 2020-07-18 DIAGNOSIS — E785 Hyperlipidemia, unspecified: Secondary | ICD-10-CM | POA: Diagnosis not present

## 2020-07-18 DIAGNOSIS — R7303 Prediabetes: Secondary | ICD-10-CM

## 2020-07-18 NOTE — Progress Notes (Signed)
[facility] [address] [name] [address] [phone]       [place] [name] Date of Birth:  07-31-1961 Medical Record Number:  [id number]   Reason for Referral:  hospital stroke follow up    SUBJECTIVE:   CHIEF COMPLAINT:  Chief Complaint  Patient presents with  . Hospitalization Follow-up    HFU after stroke.  Mild left hand numbness but otherwise greatly improving  . treatment room    alone     HPI:    Stroke admission Pertinent notes, labs and imaging personally reviewed Ms.Brooke L Smithis a 59 y.o.femalewith history of hypertension, hyperlipidemia, hypothyroidism, right shoulder rotator cuff injury status post repairwho presented on 06/21/2020 with L arm and hand weakness and numbness.Stroke work-up revealed patchy right MCA infarct s/p TPA in setting of previous R partial infarct, embolic secondary to unknown source. Loop recorder placed to assess for atrial fibrillation as potential etiology. Recommended DAPT for 3 weeks and aspirin alone as well as aggressive stroke risk factor management including HTN, HLD and prediabetes. Hypercoagulable work-up pending at discharge. Other stroke risk factors include former tobacco use, EtOH use, obesity, family history of stroke and oral estrogen. Evaluated by therapies and discharged home in stable condition without therapy needs.  Stroke:patchy R MCA infarct s/p tPA in setting of previous R parietal infarct,embolic secondary tounknownsource  Code Stroke CT head No acute abnormality. ASPECTS 10.   CTA head & neckUnremarkable  MRIpatchy R parietal infarct w/ post central gyrus involvement. Small old cortical/subcortical posterior R parietal lobe infarct.   2D EchoEF 60 to 65%  LE venous Doppler neg DVT  TEENo thrombus, vegetation, or mass seen. Bubble study was negative for PFO.   Loop recorder placed 7/30  LDL 65 -continue Crestor  HgbA1c  6.5  UDS negative  Hypercoagulable work-up NEG  No antithromboticprior to admission, now on aspirin and plavix. Continue DAPT x 3 weeks then aspirin alone.   Therapy recommendations:no therapy needs   Disposition:Return home  Today, 07/18/2020, Ms. [name] is being seen for hospital follow-up  Residual deficits of left hand fingertip numbness overall greatly improving.  Denies additional weakness. She has since discontinued her oral estrogen and has since been experiencing hot flashes, fatigue, occasional 1-2 sec dizziness sensation and mood changes.  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 Denies new or worsening stroke/TIA symptoms  Completed 3 weeks DAPT and remains on aspirin alone without bleeding or bruising Continues on Crestor without myalgias Blood pressure today 116/84 - monitors at home which has been similar to today - currently on losartan and hygroton managed by PCP Loop recorder has not shown atrial fibrillation thus far  Hypercoagulable panel reviewed as results pending at discharge with all labs negative  No further concerns at this time      ROS:   14 system review of systems performed and negative with exception of numbness, fatigue, hot flashes, mood changes and dizziness  PMH:  Past Medical History:  Diagnosis Date  . Abnormal Pap smear of cervix   . Anemia 2007   due to fibroids  . Duodenitis without hemorrhage   . Esophageal stricture   . Esophagitis   . GERD (gastroesophageal reflux disease)   . Hiatal hernia   . Hyperlipidemia   . Hypertension   . Hypothyroidism   . Leiomyoma of uterus, unspecified  fibroids  . Pneumonia    aspiration pneumonia after balloon surgery for weight loss    PSH:  Past Surgical History:  Procedure Laterality Date  . BICEPT TENODESIS Right 12/07/2019   Procedure: BICEPS TENODESIS;  Surgeon:  Meredith Pel, MD;  Location: [facility];  Service: Orthopedics;  Laterality: Right;  . BUBBLE STUDY  06/23/2020   Procedure: BUBBLE STUDY;  Surgeon: Donato Heinz, MD;  Location: [facility] ENDOSCOPY;  Service: Cardiovascular;;  . CARPAL TUNNEL RELEASE  2014   right wrist  . CHOLECYSTECTOMY    . HYSTEROSCOPY W/ ENDOMETRIAL ABLATION  4/05  . LOOP RECORDER INSERTION N/A 06/23/2020   Procedure: LOOP RECORDER INSERTION;  Surgeon: Constance Haw, MD;  Location: [facility] CV LAB;  Service: Cardiovascular;  Laterality: N/A;  . RECTOCELE REPAIR  2007  . SHOULDER ARTHROSCOPY WITH OPEN ROTATOR CUFF REPAIR Right 12/07/2019   Procedure: RIGHT SHOULDER ARTHROSCOPY,   MINI OPEN SUBSCAPULARIS, AND SUPRASPINATUS REPAIR, OPEN BICEPS TENODESIS;  Surgeon: Meredith Pel, MD;  Location: [facility];  Service: Orthopedics;  Laterality: Right;  . TEE WITHOUT CARDIOVERSION N/A 06/23/2020   Procedure: TRANSESOPHAGEAL ECHOCARDIOGRAM (TEE);  Surgeon: Donato Heinz, MD;  Location: [facility] ENDOSCOPY;  Service: Cardiovascular;  Laterality: N/A;  . TONSILLECTOMY     as child  . TOTAL VAGINAL HYSTERECTOMY  5/07  . TUBAL LIGATION  1991   BILATERAL    Social History:  Social History   Socioeconomic History  . Marital status: Married    Spouse name: Not on file  . Number of children: 3  . Years of education: Not on file  . Highest education level: Not on file  Occupational History  . Occupation: Nurse  Tobacco Use  . Smoking status: Former Smoker    Quit date: 12/23/2002    Years since quitting: 17.5  . Smokeless tobacco: Never Used  Vaping Use  . Vaping Use: Never used  Substance and Sexual Activity  . Alcohol use: Not Currently  . Drug use: No  . Sexual activity: Yes    Partners: Male    Birth control/protection: Surgical    Comment: [facility]  Other Topics Concern  . Not on file  Social History Narrative  . Not on file   Social Determinants of Health    Financial Resource Strain:   . Difficulty of Paying Living Expenses: Not on file  Food Insecurity:   . Worried About Charity fundraiser in the Last Year: Not on file  . Ran Out of Food in the Last Year: Not on file  Transportation Needs:   . Lack of Transportation (Medical): Not on file  . Lack of Transportation (Non-Medical): Not on file  Physical Activity:   . Days of Exercise per Week: Not on file  . Minutes of Exercise per Session: Not on file  Stress:   . Feeling of Stress : Not on file  Social Connections:   . Frequency of Communication with Friends and Family: Not on file  . Frequency of Social Gatherings with Friends and Family: Not on file  . Attends Religious Services: Not on file  . Active Member of Clubs or Organizations: Not on file  . Attends Archivist Meetings: Not on file  . Marital Status: Not on file  Intimate Partner Violence:   . Fear of Current or Ex-Partner: Not on file  . Emotionally Abused: Not on file  . Physically Abused: Not on file  . Sexually Abused: Not  on file    Family History:  Family History  Problem Relation Age of Onset  . Celiac disease Mother   . Osteoporosis Mother   . Heart disease Father   . Stroke Father   . Irritable bowel syndrome Sister   . Lung cancer Maternal Grandmother   . Breast cancer Paternal Grandmother   . Heart disease Sister   . Melanoma Sister   . Colon cancer Neg Hx   . Esophageal cancer Neg Hx   . Stomach cancer Neg Hx   . Rectal cancer Neg Hx     Medications:   Current Outpatient Medications on File Prior to Visit  Medication Sig Dispense Refill  . acyclovir (ZOVIRAX) 400 MG tablet Take 1 tablet (400 mg total) by mouth 2 (two) times daily. (Patient taking differently: Take 400 mg by mouth 2 (two) times daily as needed (Fever blisters). ) 180 tablet 4  . AIMOVIG 140 MG/ML SOAJ Inject 140 mg into the skin every 30 (thirty) days.    Marland Kitchen aspirin EC 81 MG EC tablet Take 1 tablet (81 mg total) by  mouth daily. Swallow whole. 30 tablet 11  . chlorthalidone (HYGROTON) 25 MG tablet Take 25 mg by mouth daily.    . Cholecalciferol (VITAMIN D) 2000 units tablet Take 2,000 Units by mouth 2 (two) times daily.    . DULoxetine (CYMBALTA) 30 MG capsule Take 30 mg by mouth every evening.     Marland Kitchen levothyroxine (SYNTHROID, LEVOTHROID) 75 MCG tablet Take 37.5-75 mcg by mouth See admin instructions. 75 mcg on all days except Tuesday patient takes 37.5 mcg ( 1/2 of 75 mcg tablet)    . losartan (COZAAR) 100 MG tablet Take 100 mg by mouth every evening.     . Melatonin 5 MG CAPS Take 5 mg by mouth at bedtime.    . Multiple Vitamin (MULTIVITAMIN) capsule Take 1 capsule by mouth daily.    . pramipexole (MIRAPEX) 0.125 MG tablet Take 1 tablet by mouth at bedtime.  3  . rosuvastatin (CRESTOR) 10 MG tablet Take 10 mg by mouth every evening.     . vitamin B-12 (CYANOCOBALAMIN) 100 MCG tablet Take 100 mcg by mouth daily.     No current facility-administered medications on file prior to visit.    Allergies:  No Known Allergies    OBJECTIVE:  Physical Exam  Vitals:   07/18/20 0810  BP: 116/84  Pulse: 84  Weight: 206 lb 6.4 oz (93.6 kg)  Height: 5\' 5"  (1.651 m)   Body mass index is 34.35 kg/m. No exam data present  No flowsheet data found.   General: well developed, well nourished, pleasant middle-age Caucasian female, seated, in no evident distress Head: head normocephalic and atraumatic.   Neck: supple with no carotid or supraclavicular bruits Cardiovascular: regular rate and rhythm, no murmurs Musculoskeletal: no deformity Skin:  no rash/petichiae Vascular:  Normal pulses all extremities   Neurologic Exam Mental Status: Awake and fully alert.   Fluent speech and language.  Oriented to place and time. Recent and remote memory intact. Attention span, concentration and fund of knowledge appropriate. Mood and affect appropriate.  Cranial Nerves: Fundoscopic exam reveals sharp disc margins.  Pupils equal, briskly reactive to light. Extraocular movements full without nystagmus. Visual fields full to confrontation. Hearing intact. Facial sensation intact. Face, tongue, palate moves normally and symmetrically.  Motor: Normal bulk and tone. Normal strength in all tested extremity muscles. Sensory.:  Slightly decreased pinprick sensation left distal fingers compared to right  hand.  Otherwise intact to touch , pinprick , position and vibratory sensation.  Coordination: Rapid alternating movements normal in all extremities. Finger-to-nose and heel-to-shin performed accurately bilaterally. Gait and Station: Arises from chair without difficulty. Stance is normal. Gait demonstrates normal stride length and balance Reflexes: 1+ and symmetric. Toes downgoing.     NIHSS  1 Modified Rankin  1      ASSESSMENT: [name] is a 59 y.o. year old female presented with left arm and hand weakness and numbness on 06/21/2020 with stroke work-up revealing patchy right MCA infarct s/p TPA in setting of previous R parietal infarct, embolic secondary to unknown source. Loop recorder placed. Vascular risk factors include HTN, HLD, prediabetes, prior stroke on imaging, estrogen use, obesity and prior tobacco use.      PLAN:  1. R MCA stroke, cryptogenic:  a. Residual deficit: Mild left hand fingertip numbness.  b. Loop recorder has not shown atrial fibrillation thus far. Monthly reports personally reviewed. c. Continue aspirin 81 mg daily  and Crestor for secondary stroke prevention.  d. Close PCP follow up for aggressive stroke risk factor management  e. Additional information provided by [place] research for [place] trial participation 2. HTN:  a. BP goal <130/90.   b. Stable.   c. Managed by PCP 3. HLD:  a. LDL goal <70. Recent LDL 65.  b. Continue Crestor 40 mg daily c. Management prescribed by PCP 4. Pre-DMII:  a. A1c goal<7.0. Recent A1c 6.5.  b. Managed by PCP 5. Menopausal symptoms:   a. Discontinue oral estrogen on 8/3 b. Since then, has experienced symptoms of hot flashes, fatigue, 1-2 sec dizziness and mood changes c. Encourage follow-up with OB/GYN to discuss other treatment options as it is recommended to avoid treatment with estrogen due to increased stroke risk    Follow up in 4 months or call earlier if needed   I spent 45 minutes of face-to-face and non-face-to-face time with patient.  This included previsit chart review, lab review, study review, order entry, electronic health record documentation, patient education regarding recent stroke, residual deficits, menopausal symptoms, importance of managing stroke risk factors and answered all questions to patient satisfaction     Frann Rider, [facility]  [facility] [address] [place], [place] [address]  Phone [phone] Fax [phone] Note: This document was prepared with digital dictation and possible smart phrase technology. Any transcriptional errors that result from this process are unintentional.

## 2020-07-18 NOTE — Patient Instructions (Signed)
Additional information provided for [place] trial - please consider participating   Continue aspirin 81 mg daily  and Crestor  for secondary stroke prevention  Continue to follow up with PCP regarding cholesterol, blood pressure and pre-diabetes management  Maintain strict control of hypertension with blood pressure goal below 130/90, diabetes with hemoglobin A1c goal below 7.0% and cholesterol with LDL cholesterol (bad cholesterol) goal below 70 mg/dL.       Followup in the future with me in 4 months or call earlier if needed       Thank you for coming to see [place] at [facility]. I hope we have been able to provide you high quality care today.  You may receive a patient satisfaction survey over the next few weeks. We would appreciate your feedback and comments so that we may continue to improve ourselves and the health of our patients.

## 2020-07-21 MED FILL — CHLORTHALIDONE 25 MG TABS: 25 | 90 days supply | Qty: 90 | Fill #1

## 2020-07-25 NOTE — Progress Notes (Signed)
I agree with the above plan 

## 2020-07-27 ENCOUNTER — Ambulatory Visit (INDEPENDENT_AMBULATORY_CARE_PROVIDER_SITE_OTHER): Payer: 59 | Admitting: *Deleted

## 2020-07-27 DIAGNOSIS — I63411 Cerebral infarction due to embolism of right middle cerebral artery: Secondary | ICD-10-CM | POA: Diagnosis not present

## 2020-07-27 LAB — CUP PACEART REMOTE DEVICE CHECK
Date Time Interrogation Session: 20210901142640
Implantable Pulse Generator Implant Date: 20210730

## 2020-08-01 MED FILL — PRAMIPEXOLE 0.125 MG TABLET: 0.125 | 90 days supply | Qty: 90 | Fill #1

## 2020-08-01 MED FILL — LEVOTHYROXINE 75 MCG TABLET: 75 | 90 days supply | Qty: 90 | Fill #1

## 2020-08-01 NOTE — Progress Notes (Signed)
Carelink Summary Report / Loop Recorder 

## 2020-08-14 ENCOUNTER — Encounter: Payer: Self-pay | Admitting: Adult Health

## 2020-08-15 MED FILL — AIMOVIG 140 MG/ML SOAJ: 140 | 30 days supply | Qty: 1 | Fill #2

## 2020-08-24 MED FILL — OMEPRAZOLE 40 MG CPDR: 40 | 90 days supply | Qty: 180 | Fill #1

## 2020-08-28 ENCOUNTER — Ambulatory Visit (INDEPENDENT_AMBULATORY_CARE_PROVIDER_SITE_OTHER): Payer: 59

## 2020-08-28 DIAGNOSIS — I495 Sick sinus syndrome: Secondary | ICD-10-CM | POA: Diagnosis not present

## 2020-09-01 LAB — CUP PACEART REMOTE DEVICE CHECK
Date Time Interrogation Session: 20211004142320
Implantable Pulse Generator Implant Date: 20210730

## 2020-09-04 NOTE — Progress Notes (Signed)
Carelink Summary Report / Loop Recorder 

## 2020-09-11 MED FILL — AIMOVIG 140 MG/ML SOAJ: 140 | 30 days supply | Qty: 1 | Fill #3

## 2020-09-12 ENCOUNTER — Other Ambulatory Visit: Payer: Self-pay

## 2020-09-12 NOTE — Patient Outreach (Signed)
[place] [facility]) Care Management  09/12/2020  Marianny Goris 1961-09-13 [id number]   First telephone outreach attempt to obtain mRS. No answer. Left message for returned call.  [facility] Management Assistant [phone]

## 2020-09-15 DIAGNOSIS — Z1231 Encounter for screening mammogram for malignant neoplasm of breast: Secondary | ICD-10-CM | POA: Diagnosis not present

## 2020-09-19 ENCOUNTER — Encounter: Payer: Self-pay | Admitting: Obstetrics & Gynecology

## 2020-09-21 ENCOUNTER — Encounter: Payer: Self-pay | Admitting: *Deleted

## 2020-09-22 ENCOUNTER — Other Ambulatory Visit: Payer: Self-pay

## 2020-09-22 NOTE — Patient Outreach (Signed)
[place] [facility]) Care Management  09/22/2020  [name] Nov 30, 1960 [id number]  Second telephone outreach attempt to obtain mRS. No answer. Left message for returned call. Called both numbers in the chart  [facility] Management Assistant [phone]

## 2020-09-26 ENCOUNTER — Encounter: Payer: Self-pay | Admitting: *Deleted

## 2020-09-28 ENCOUNTER — Ambulatory Visit (INDEPENDENT_AMBULATORY_CARE_PROVIDER_SITE_OTHER): Payer: 59

## 2020-09-28 DIAGNOSIS — I495 Sick sinus syndrome: Secondary | ICD-10-CM | POA: Diagnosis not present

## 2020-10-01 LAB — CUP PACEART REMOTE DEVICE CHECK
Date Time Interrogation Session: 20211106142207
Implantable Pulse Generator Implant Date: 20210730

## 2020-10-02 ENCOUNTER — Other Ambulatory Visit: Payer: Self-pay

## 2020-10-02 NOTE — Progress Notes (Signed)
Carelink Summary Report / Loop Recorder 

## 2020-10-02 NOTE — Patient Outreach (Signed)
[place] [facility]) Care Management  10/02/2020  [name] Jun 13, 1961 [id number]   3 outreach attempts were completed to obtain mRs. mRs could not be obtained because patient never returned my calls. mRs=7    [facility] Management Assistant [phone]

## 2020-10-12 MED FILL — DULoxetine HCL 30 MG CPEP: 30 | 90 days supply | Qty: 90 | Fill #2

## 2020-10-12 MED FILL — ROSUVASTATIN CALCIUM 10 MG: 10 | 90 days supply | Qty: 90 | Fill #3

## 2020-10-16 MED FILL — AIMOVIG 140 MG/ML SOAJ: 140 | 30 days supply | Qty: 1 | Fill #4

## 2020-10-23 MED FILL — LOSARTAN POTASSIUM 100 MG T: 100 | 90 days supply | Qty: 90 | Fill #2

## 2020-10-23 MED FILL — CHLORTHALIDONE 25 MG TABS: 25 | 90 days supply | Qty: 90 | Fill #2

## 2020-10-30 ENCOUNTER — Ambulatory Visit (INDEPENDENT_AMBULATORY_CARE_PROVIDER_SITE_OTHER): Payer: 59

## 2020-10-30 DIAGNOSIS — I63411 Cerebral infarction due to embolism of right middle cerebral artery: Secondary | ICD-10-CM | POA: Diagnosis not present

## 2020-11-01 LAB — CUP PACEART REMOTE DEVICE CHECK
Date Time Interrogation Session: 20211206000517
Implantable Pulse Generator Implant Date: 20210730

## 2020-11-01 MED FILL — PRAMIPEXOLE 0.125 MG TABLET: 0.125 | 90 days supply | Qty: 90 | Fill #2

## 2020-11-08 MED FILL — LEVOTHYROXINE 75 MCG TABLET: 75 | 90 days supply | Qty: 90 | Fill #2

## 2020-11-09 MED FILL — AIMOVIG 140 MG/ML SOAJ: 140 | 30 days supply | Qty: 1 | Fill #5

## 2020-11-09 NOTE — Progress Notes (Signed)
Carelink Summary Report / Loop Recorder 

## 2020-11-20 ENCOUNTER — Ambulatory Visit: Payer: 59 | Admitting: Adult Health

## 2020-11-23 MED FILL — OMEPRAZOLE 40 MG CPDR: 40 | 90 days supply | Qty: 180 | Fill #2

## 2020-11-27 ENCOUNTER — Ambulatory Visit: Payer: 59 | Admitting: Adult Health

## 2020-11-27 ENCOUNTER — Encounter: Payer: Self-pay | Admitting: Adult Health

## 2020-11-27 VITALS — BP 114/79 | HR 97 | Ht 65.0 in | Wt 190.2 lb

## 2020-11-27 DIAGNOSIS — I10 Essential (primary) hypertension: Secondary | ICD-10-CM

## 2020-11-27 DIAGNOSIS — R7303 Prediabetes: Secondary | ICD-10-CM | POA: Diagnosis not present

## 2020-11-27 DIAGNOSIS — E785 Hyperlipidemia, unspecified: Secondary | ICD-10-CM

## 2020-11-27 DIAGNOSIS — I63411 Cerebral infarction due to embolism of right middle cerebral artery: Secondary | ICD-10-CM | POA: Diagnosis not present

## 2020-11-27 NOTE — Patient Instructions (Addendum)
Continue aspirin 81 mg daily  and Crestor for secondary stroke prevention  Loop recorder has not shown atrial fibrillation thus far -this will continue to be monitored monthly by cardiology  Continue to follow up with PCP regarding cholesterol and blood pressure management  Maintain strict control of hypertension with blood pressure goal below 130/90 and cholesterol with LDL cholesterol (bad cholesterol) goal below 70 mg/dL.     As you have been stable, would recommend follow up on an as needed basis      Thank you for coming to see [place] at [facility]. I hope we have been able to provide you high quality care today.  You may receive a patient satisfaction survey over the next few weeks. We would appreciate your feedback and comments so that we may continue to improve ourselves and the health of our patients.

## 2020-11-27 NOTE — Progress Notes (Signed)
I agree with the above plan 

## 2020-11-27 NOTE — Progress Notes (Signed)
[facility] [address] [place]. [place] [address] [phone]       STROKE FOLLOW UP NOTE  Ms. [name] Date of Birth:  06-04-1961 Medical Record Number:  [id number]   Reason for Referral: stroke follow up    SUBJECTIVE:   CHIEF COMPLAINT:  Chief Complaint  Patient presents with  . Follow-up    Room 9. Last seen 07/18/20.  Marland Kitchen Cerebrovascular Accident    Patient reports that she is doing well.   . Other    Patient reports she had positive Covid on December 14.    HPI:   Today, 11/27/2020, Ms. [name] returns for 59-month stroke follow-up unaccompanied.  Reports residual left middle and ring fingertip numbness which has been slowly improving. This does not interfer with daily activity or functioning.  Denies new or worsening stroke/TIA symptoms.  Remains on aspirin and Crestor for secondary stroke prevention without side effects.  Blood pressure today 114/79.  Loop recorder has not shown atrial fibrillation thus far.  She continues to experience postmenopausal symptoms since discontinuing estrogen usage but has not been able to schedule follow-up with her OB/GYN since prior visit.  She has also had 2 episodes since prior visit of zap type sensation right side of her head which only last for couple seconds and not associated with any other symptoms.  This has been present since her stroke and is unsure if she was experiencing these prior.  She also report having COVID in Decemeber without severe symptoms or residual symptoms.  No further concerns at this time.    History provided for reference purposes only Initial visit 07/18/2020 [name] is being seen for hospital follow-up Residual deficits of left hand fingertip numbness overall greatly improving.  Denies additional weakness. She has since discontinued her oral estrogen and has since been experiencing hot flashes, fatigue, occasional 1-2 sec dizziness sensation and mood changes.  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 Denies new or worsening stroke/TIA symptoms Completed 3 weeks DAPT and remains on aspirin alone without bleeding or bruising Continues on Crestor without myalgias Blood pressure today 116/84 - monitors at home which has been similar to today - currently on losartan and hygroton managed by PCP Loop recorder has not shown atrial fibrillation thus far Hypercoagulable panel reviewed as results pending at discharge with all labs negative No further concerns at this time  Stroke admission 06/21/2020 Pertinent notes, labs and imaging personally reviewed BrookeBrooke L Smithis a 60 y.o.femalewith history of hypertension, hyperlipidemia, hypothyroidism, right shoulder rotator cuff injury status post repairwho presented on 06/21/2020 with L arm and hand weakness and numbness.Stroke work-up revealed patchy right MCA infarct s/p TPA in setting of previous R partial infarct, embolic secondary to unknown source. Loop recorder placed to assess for atrial fibrillation as potential etiology. Recommended DAPT for 3 weeks and aspirin alone as well as aggressive stroke risk factor management including HTN, HLD and prediabetes. Hypercoagulable work-up pending at discharge. Other stroke risk factors include former tobacco use, EtOH use, obesity, family history of stroke and oral estrogen. Evaluated by therapies and discharged home in stable condition without therapy needs.  Stroke:patchy R MCA infarct s/p tPA in setting of previous R parietal infarct,embolic secondary tounknownsource  Code Stroke CT head No acute abnormality. ASPECTS 10.   CTA head & neckUnremarkable  MRIpatchy R parietal infarct w/ post central gyrus involvement. Small old  cortical/subcortical posterior R parietal lobe infarct.   2D EchoEF 60 to 65%  LE venous Doppler neg DVT  TEENo thrombus,  vegetation, or mass seen. Bubble study was negative for PFO.   Loop recorder placed 7/30  LDL 65 -continue Crestor  HgbA1c 6.5  UDS negative  Hypercoagulable work-up NEG  No antithromboticprior to admission, now on aspirin and plavix. Continue DAPT x 3 weeks then aspirin alone.   Therapy recommendations:no therapy needs   Disposition:Return home      ROS:   14 system review of systems performed and negative with exception of those listed in HPI  PMH:  Past Medical History:  Diagnosis Date  . Abnormal Pap smear of cervix   . Anemia 2007   due to fibroids  . Duodenitis without hemorrhage   . Esophageal stricture   . Esophagitis   . GERD (gastroesophageal reflux disease)   . Hiatal hernia   . Hyperlipidemia   . Hypertension   . Hypothyroidism   . Leiomyoma of uterus, unspecified    fibroids  . Pneumonia    aspiration pneumonia after balloon surgery for weight loss    PSH:  Past Surgical History:  Procedure Laterality Date  . BICEPT TENODESIS Right 12/07/2019   Procedure: BICEPS TENODESIS;  Surgeon: Meredith Pel, MD;  Location: [place];  Service: Orthopedics;  Laterality: Right;  . BUBBLE STUDY  06/23/2020   Procedure: BUBBLE STUDY;  Surgeon: Donato Heinz, MD;  Location: [facility] ENDOSCOPY;  Service: Cardiovascular;;  . CARPAL TUNNEL RELEASE  2014   right wrist  . CHOLECYSTECTOMY    . HYSTEROSCOPY W/ ENDOMETRIAL ABLATION  4/05  . LOOP RECORDER INSERTION N/A 06/23/2020   Procedure: LOOP RECORDER INSERTION;  Surgeon: Constance Haw, MD;  Location: [place] CV LAB;  Service: Cardiovascular;  Laterality: N/A;  . RECTOCELE REPAIR  2007  . SHOULDER ARTHROSCOPY WITH OPEN ROTATOR CUFF REPAIR Right 12/07/2019   Procedure: RIGHT SHOULDER ARTHROSCOPY,   MINI OPEN SUBSCAPULARIS, AND SUPRASPINATUS REPAIR, OPEN BICEPS TENODESIS;  Surgeon: Meredith Pel, MD;  Location: [place];  Service: Orthopedics;   Laterality: Right;  . TEE WITHOUT CARDIOVERSION N/A 06/23/2020   Procedure: TRANSESOPHAGEAL ECHOCARDIOGRAM (TEE);  Surgeon: Donato Heinz, MD;  Location: [facility] ENDOSCOPY;  Service: Cardiovascular;  Laterality: N/A;  . TONSILLECTOMY     as child  . TOTAL VAGINAL HYSTERECTOMY  5/07  . TUBAL LIGATION  1991   BILATERAL    Social History:  Social History   Socioeconomic History  . Marital status: Married    Spouse name: Not on file  . Number of children: 3  . Years of education: Not on file  . Highest education level: Not on file  Occupational History  . Occupation: Nurse  Tobacco Use  . Smoking status: Former Smoker    Quit date: 12/23/2002    Years since quitting: 17.9  . Smokeless tobacco: Never Used  Vaping Use  . Vaping Use: Never used  Substance and Sexual Activity  . Alcohol use: Not Currently  . Drug use: No  . Sexual activity: Yes    Partners: Male    Birth control/protection: Surgical    Comment: [facility]  Other Topics Concern  . Not on file  Social History Narrative  . Not on file   Social Determinants of Health   Financial Resource Strain: Not on file  Food Insecurity: Not on file  Transportation Needs: Not on file  Physical Activity: Not on file  Stress: Not on file  Social Connections: Not on file  Intimate Partner Violence: Not on file    Family History:  Family History  Problem Relation Age of Onset  . Celiac disease Mother   . Osteoporosis Mother   . Heart disease Father   . Stroke Father   . Irritable bowel syndrome Sister   . Lung cancer Maternal Grandmother   . Breast cancer Paternal Grandmother   . Heart disease Sister   . Melanoma Sister   . Colon cancer Neg Hx   . Esophageal cancer Neg Hx   . Stomach cancer Neg Hx   . Rectal cancer Neg Hx     Medications:   Current Outpatient Medications on File Prior to Visit  Medication Sig Dispense Refill  . acyclovir (ZOVIRAX) 400 MG tablet Take 1 tablet (400 mg total) by mouth 2 (two)  times daily. (Patient taking differently: Take 400 mg by mouth 2 (two) times daily as needed (Fever blisters).) 180 tablet 4  . AIMOVIG 140 MG/ML SOAJ Inject 140 mg into the skin every 30 (thirty) days.    Marland Kitchen aspirin EC 81 MG EC tablet Take 1 tablet (81 mg total) by mouth daily. Swallow whole. 30 tablet 11  . chlorthalidone (HYGROTON) 25 MG tablet Take 25 mg by mouth daily.    . Cholecalciferol (VITAMIN D) 2000 units tablet Take 2,000 Units by mouth 2 (two) times daily.    . DULoxetine (CYMBALTA) 30 MG capsule Take 30 mg by mouth every evening.     Marland Kitchen levothyroxine (SYNTHROID, LEVOTHROID) 75 MCG tablet Take 37.5-75 mcg by mouth See admin instructions. 75 mcg on all days except Tuesday patient takes 37.5 mcg ( 1/2 of 75 mcg tablet)    . losartan (COZAAR) 100 MG tablet Take 100 mg by mouth every evening.     . Melatonin 5 MG CAPS Take 5 mg by mouth at bedtime.    . Multiple Vitamin (MULTIVITAMIN) capsule Take 1 capsule by mouth daily.    . pramipexole (MIRAPEX) 0.125 MG tablet Take 1 tablet by mouth at bedtime.  3  . rosuvastatin (CRESTOR) 10 MG tablet Take 10 mg by mouth every evening.     . vitamin B-12 (CYANOCOBALAMIN) 100 MCG tablet Take 100 mcg by mouth daily.     No current facility-administered medications on file prior to visit.    Allergies:  No Known Allergies    OBJECTIVE:  Physical Exam  Vitals:   11/27/20 0758  BP: 114/79  Pulse: 97  Weight: 190 lb 3.2 oz (86.3 kg)  Height: 5\' 5"  (1.651 m)   Body mass index is 31.65 kg/m. No exam data present  General: well developed, well nourished, pleasant middle-age Caucasian female, seated, in no evident distress Head: head normocephalic and atraumatic.   Neck: supple with no carotid or supraclavicular bruits Cardiovascular: regular rate and rhythm, no murmurs Musculoskeletal: no deformity Skin:  no rash/petichiae Vascular:  Normal pulses all extremities   Neurologic Exam Mental Status: Awake and fully alert.   Fluent speech  and language.  Oriented to place and time. Recent and remote memory intact. Attention span, concentration and fund of knowledge appropriate. Mood and affect appropriate.  Cranial Nerves: Pupils equal, briskly reactive to light. Extraocular movements full without nystagmus. Visual fields full to confrontation. Hearing intact. Facial sensation intact. Face, tongue, palate moves normally and symmetrically.  Motor: Normal bulk and tone. Normal strength in all tested extremity muscles. Sensory.:  Slightly decreased pinprick sensation left middle and ring  fingers distally compared to right hand.  Otherwise intact to touch , pinprick , position and vibratory sensation.  Coordination: Rapid alternating movements normal in all extremities. Finger-to-nose and heel-to-shin performed accurately bilaterally. Gait and Station: Arises from chair without difficulty. Stance is normal. Gait demonstrates normal stride length and balance Reflexes: 1+ and symmetric. Toes downgoing.        ASSESSMENT: [name] is a 60 y.o. year old female presented with left arm and hand weakness and numbness on 06/21/2020 with stroke work-up revealing patchy right MCA infarct s/p TPA in setting of previous R parietal infarct, embolic secondary to unknown source. Loop recorder placed. Vascular risk factors include HTN, HLD, prediabetes, prior stroke on imaging, estrogen use, obesity and prior tobacco use.      PLAN:  1. R MCA stroke, cryptogenic:  a. Residual deficit: Mild left middle and ring fingertip numbness and decreased sensory -reports slowly improving b. Loop recorder has not shown atrial fibrillation thus far. Monthly reports per cardiology personally reviewed. c. Continue aspirin 81 mg daily  and Crestor for secondary stroke prevention.  d. Discussed secondary stroke prevention measures and importance of close PCP follow up for aggressive stroke risk factor management  2. HTN:  a. BP goal <130/90.  Stable on  chlorthalidone and losartan per PCP 3. HLD:  a. LDL goal <70.  Prior LDL 65.  On Crestor 40 mg daily per PCP.  4. Pre-DMII:  a. A1c goal<7.0.  Prior A1c 6.5.  Currently diet controlled.   Overall stable from stroke standpoint and recommend follow-up on an as-needed basis   I spent 30 minutes of face-to-face and non-face-to-face time with patient.  This included previsit chart review, lab review, study review, order entry, electronic health record documentation, patient education regarding recent stroke, residual deficits, importance of managing stroke risk factors and answered all other questions to patient satisfaction   Ihor Austin, [place]  [facility] [address] [place], [place] [address]  Phone [phone] Fax [phone] Note: This document was prepared with digital dictation and possible smart phrase technology. Any transcriptional errors that result from this process are unintentional.

## 2020-11-29 DIAGNOSIS — E785 Hyperlipidemia, unspecified: Secondary | ICD-10-CM | POA: Diagnosis not present

## 2020-11-29 DIAGNOSIS — R7301 Impaired fasting glucose: Secondary | ICD-10-CM | POA: Diagnosis not present

## 2020-11-29 DIAGNOSIS — G2581 Restless legs syndrome: Secondary | ICD-10-CM | POA: Diagnosis not present

## 2020-11-29 DIAGNOSIS — G43909 Migraine, unspecified, not intractable, without status migrainosus: Secondary | ICD-10-CM | POA: Diagnosis not present

## 2020-11-29 DIAGNOSIS — E669 Obesity, unspecified: Secondary | ICD-10-CM | POA: Diagnosis not present

## 2020-11-29 DIAGNOSIS — I10 Essential (primary) hypertension: Secondary | ICD-10-CM | POA: Diagnosis not present

## 2020-11-29 DIAGNOSIS — K219 Gastro-esophageal reflux disease without esophagitis: Secondary | ICD-10-CM | POA: Diagnosis not present

## 2020-11-30 ENCOUNTER — Ambulatory Visit (INDEPENDENT_AMBULATORY_CARE_PROVIDER_SITE_OTHER): Payer: 59

## 2020-11-30 DIAGNOSIS — I63411 Cerebral infarction due to embolism of right middle cerebral artery: Secondary | ICD-10-CM | POA: Diagnosis not present

## 2020-12-02 LAB — CUP PACEART REMOTE DEVICE CHECK
Date Time Interrogation Session: 20220108000150
Implantable Pulse Generator Implant Date: 20210730

## 2020-12-12 MED FILL — ACYCLOVIR 400 MG TABLET: 400 | 90 days supply | Qty: 180 | Fill #1

## 2020-12-14 MED FILL — AIMOVIG 140 MG/ML SOAJ: 140 | 30 days supply | Qty: 1 | Fill #6

## 2020-12-14 NOTE — Progress Notes (Signed)
Carelink Summary Report / Loop Recorder 

## 2020-12-15 ENCOUNTER — Other Ambulatory Visit: Payer: Self-pay | Admitting: Emergency Medicine

## 2020-12-15 ENCOUNTER — Ambulatory Visit
Admission: EM | Admit: 2020-12-15 | Discharge: 2020-12-15 | Disposition: A | Discharge status: Home / Self Care | Payer: 59 | Attending: Emergency Medicine | Admitting: Emergency Medicine

## 2020-12-15 ENCOUNTER — Ambulatory Visit (INDEPENDENT_AMBULATORY_CARE_PROVIDER_SITE_OTHER): Payer: 59

## 2020-12-15 ENCOUNTER — Other Ambulatory Visit: Payer: Self-pay

## 2020-12-15 DIAGNOSIS — M7989 Other specified soft tissue disorders: Secondary | ICD-10-CM | POA: Diagnosis not present

## 2020-12-15 DIAGNOSIS — R2232 Localized swelling, mass and lump, left upper limb: Secondary | ICD-10-CM

## 2020-12-15 DIAGNOSIS — S4992XA Unspecified injury of left shoulder and upper arm, initial encounter: Secondary | ICD-10-CM

## 2020-12-15 DIAGNOSIS — M25512 Pain in left shoulder: Secondary | ICD-10-CM

## 2020-12-15 DIAGNOSIS — I517 Cardiomegaly: Secondary | ICD-10-CM | POA: Diagnosis not present

## 2020-12-15 MED ORDER — NAPROXEN 500 MG PO TABS: 500.0000 mg | ORAL_TABLET | Freq: Two times a day (BID) | ORAL | 0 refills | Status: DC

## 2020-12-15 NOTE — ED Provider Notes (Signed)
[facility]    CSN: [id number] Arrival date & time: 12/15/20  1743      History   Chief Complaint Chief Complaint  Patient presents with  . Fall    Left arm/shoulder pain    HPI [name] is a 60 y.o. female history of GERD, hypertension, presenting today for evaluation of shoulder pain after fall.  Slipped on ice and fell on her driveway, attempted to catch herself with her left arm in a bent position and since has developed a lot of pain and discomfort and limited range of motion at her left shoulder.  Majority of pain is located in her left upper arm, pain will radiate lower and into the clavicle area at times.  She denies any elbow or wrist pain.  Denies numbness or tingling.  Is having discomfort with over the head motions.  Reports prior rotator cuff tears on right side.  Denies history of diabetes.  HPI  Past Medical History:  Diagnosis Date  . Abnormal Pap smear of cervix   . Anemia 2007   due to fibroids  . Duodenitis without hemorrhage   . Esophageal stricture   . Esophagitis   . GERD (gastroesophageal reflux disease)   . Hiatal hernia   . Hyperlipidemia   . Hypertension   . Hypothyroidism   . Leiomyoma of uterus, unspecified    fibroids  . Pneumonia    aspiration pneumonia after balloon surgery for weight loss    Patient Active Problem List   Diagnosis Date Noted  . Essential hypertension 06/23/2020  . Prediabetes 06/23/2020  . Family hx-stroke 06/23/2020  . Restless legs syndrome (RLS) 06/23/2020  . Embolic cerebral infarction [facility]) s/p tPA, source unknown  06/21/2020  . Obesity (BMI 30.0-34.9) 09/16/2012  . Hyperlipidemia LDL goal <70 09/06/2008  . FIBROMYALGIA 09/06/2008  . HEADACHE, CHRONIC 09/06/2008  . FIBROIDS, UTERUS 09/01/2008  . Hypothyroidism 09/01/2008  . GERD 09/01/2008    Past Surgical History:  Procedure Laterality Date  . BICEPT TENODESIS Right 12/07/2019   Procedure: BICEPS TENODESIS;  Surgeon: Meredith Pel, MD;  Location: [place];  Service: Orthopedics;  Laterality: Right;  . BUBBLE STUDY  06/23/2020   Procedure: BUBBLE STUDY;  Surgeon: Donato Heinz, MD;  Location: [facility] ENDOSCOPY;  Service: Cardiovascular;;  . CARPAL TUNNEL RELEASE  2014   right wrist  . CHOLECYSTECTOMY    . HYSTEROSCOPY W/ ENDOMETRIAL ABLATION  4/05  . LOOP RECORDER INSERTION N/A 06/23/2020   Procedure: LOOP RECORDER INSERTION;  Surgeon: Constance Haw, MD;  Location: [place] CV LAB;  Service: Cardiovascular;  Laterality: N/A;  . RECTOCELE REPAIR  2007  . SHOULDER ARTHROSCOPY WITH OPEN ROTATOR CUFF REPAIR Right 12/07/2019   Procedure: RIGHT SHOULDER ARTHROSCOPY,   MINI OPEN SUBSCAPULARIS, AND SUPRASPINATUS REPAIR, OPEN BICEPS TENODESIS;  Surgeon: Meredith Pel, MD;  Location: [place];  Service: Orthopedics;  Laterality: Right;  . TEE WITHOUT CARDIOVERSION N/A 06/23/2020   Procedure: TRANSESOPHAGEAL ECHOCARDIOGRAM (TEE);  Surgeon: Donato Heinz, MD;  Location: [facility] ENDOSCOPY;  Service: Cardiovascular;  Laterality: N/A;  . TONSILLECTOMY     as child  . TOTAL VAGINAL HYSTERECTOMY  5/07  . TUBAL LIGATION  1991   BILATERAL    OB History    Gravida  3   Para  3   Term  3   Preterm  0   AB  0   Living  3     SAB  0  IAB  0   Ectopic  0   Multiple  0   Live Births  3            Home Medications    Prior to Admission medications   Medication Sig Start Date End Date Taking? Authorizing Provider  acyclovir (ZOVIRAX) 400 MG tablet Take 1 tablet (400 mg total) by mouth 2 (two) times daily. Patient taking differently: Take 400 mg by mouth 2 (two) times daily as needed (Fever blisters). 03/09/20  Yes Megan Salon, MD  AIMOVIG 140 MG/ML SOAJ Inject 140 mg into the skin every 30 (thirty) days. 06/15/20  Yes [provider]  aspirin EC 81 MG EC tablet Take 1 tablet (81 mg total) by mouth daily. Swallow whole. 06/24/20  Yes Donzetta Starch, NP  chlorthalidone (HYGROTON) 25 MG tablet Take 25 mg by mouth daily.   Yes [provider]  Cholecalciferol (VITAMIN D) 2000 units tablet Take 2,000 Units by mouth 2 (two) times daily.   Yes [provider]  DULoxetine (CYMBALTA) 30 MG capsule Take 30 mg by mouth every evening.    Yes [provider]  levothyroxine (SYNTHROID, LEVOTHROID) 75 MCG tablet Take 37.5-75 mcg by mouth See admin instructions. 75 mcg on all days except Tuesday patient takes 37.5 mcg ( 1/2 of 75 mcg tablet)   Yes [provider]  losartan (COZAAR) 100 MG tablet Take 100 mg by mouth every evening.    Yes [provider]  Melatonin 5 MG CAPS Take 5 mg by mouth at bedtime.   Yes [provider]  Multiple Vitamin (MULTIVITAMIN) capsule Take 1 capsule by mouth daily.   Yes [provider]  naproxen (NAPROSYN) 500 MG tablet Take 1 tablet (500 mg total) by mouth 2 (two) times daily. 12/15/20  Yes Mozel Burdett C, PA-C  pramipexole (MIRAPEX) 0.125 MG tablet Take 1 tablet by mouth at bedtime. 06/16/18  Yes [provider]  rosuvastatin (CRESTOR) 10 MG tablet Take 10 mg by mouth every evening.    Yes [provider]  vitamin B-12 (CYANOCOBALAMIN) 100 MCG tablet Take 100 mcg by mouth daily.   Yes [provider]    Family History Family History  Problem Relation Age of Onset  . Celiac disease Mother   . Osteoporosis Mother   . Heart disease Father   . Stroke Father   . Irritable bowel syndrome Sister   . Lung cancer Maternal Grandmother   . Breast cancer Paternal Grandmother   . Heart disease Sister   . Melanoma Sister   . Colon cancer Neg Hx   . Esophageal cancer Neg Hx   . Stomach cancer Neg Hx   . Rectal cancer Neg Hx     Social History Social History   Tobacco Use  . Smoking status: Former Smoker    Quit date: 12/23/2002    Years since quitting: 17.9  . Smokeless tobacco: Never Used  Vaping Use  . Vaping Use:  Never used  Substance Use Topics  . Alcohol use: Not Currently  . Drug use: No     Allergies   Patient has no known allergies.   Review of Systems Review of Systems  Constitutional: Negative for fatigue and fever.  Eyes: Negative for visual disturbance.  Respiratory: Negative for shortness of breath.   Cardiovascular: Negative for chest pain.  Gastrointestinal: Negative for abdominal pain, nausea and vomiting.  Musculoskeletal: Positive for arthralgias, joint swelling and myalgias.  Skin: Negative for color  change, rash and wound.  Neurological: Negative for dizziness, weakness, light-headedness and headaches.     Physical Exam Triage Vital Signs ED Triage Vitals  Enc Vitals Group     BP 12/15/20 1829 115/79     Pulse Rate 12/15/20 1829 91     Resp 12/15/20 1829 18     Temp 12/15/20 1829 98.3 F (36.8 C)     Temp Source 12/15/20 1829 Oral     SpO2 12/15/20 1829 98 %     Weight --      Height --      Head Circumference --      Peak Flow --      Pain Score 12/15/20 1831 5     Pain Loc --      Pain Edu? --      Excl. in [place]? --    No data found.  Updated Vital Signs BP 115/79 (BP Location: Right Arm)   Pulse 91   Temp 98.3 F (36.8 C) (Oral)   Resp 18   LMP 11/25/2005   SpO2 98%   Visual Acuity Right Eye Distance:   Left Eye Distance:   Bilateral Distance:    Right Eye Near:   Left Eye Near:    Bilateral Near:     Physical Exam Vitals and nursing note reviewed.  Constitutional:      Appearance: She is well-developed and well-nourished.     Comments: No acute distress  HENT:     Head: Normocephalic and atraumatic.     Nose: Nose normal.  Eyes:     Conjunctiva/sclera: Conjunctivae normal.  Cardiovascular:     Rate and Rhythm: Normal rate.  Pulmonary:     Effort: Pulmonary effort is normal. No respiratory distress.  Abdominal:     General: There is no distension.  Musculoskeletal:        General: Normal range of motion.     Cervical back:  Neck supple.     Comments: Left shoulder: No obvious swelling or deformity, nontender along length of clavicle AC joint or scapular spine, tenderness to proximal humeral area, no bicep tendon tenderness, no trapezius tenderness, limited passive range of motion beyond 90 degrees, patient resisting due to pain, significantly more limited active range of motion, negative lift off, slight weakness and pain palpated with resisted external rotation  Skin:    General: Skin is warm and dry.  Neurological:     Mental Status: She is alert and oriented to person, place, and time.  Psychiatric:        Mood and Affect: Mood and affect normal.      UC Treatments / Results  Labs (all labs ordered are listed, but only abnormal results are displayed) Labs Reviewed - No data to display  EKG   Radiology DG Shoulder Left  Result Date: 12/15/2020 CLINICAL DATA:  Decreased range of motion, pain and swelling, fall earlier today EXAM: LEFT SHOULDER - 2+ VIEW COMPARISON:  None. FINDINGS: There is no evidence of fracture or dislocation. There is no evidence of arthropathy or other focal bone abnormality. Soft tissues are unremarkable. Included chest demonstrates cardiomegaly.  Trachea midline. IMPRESSION: No acute osseous finding or malalignment. Electronically Signed   By: Jerilynn Mages.  Shick M.D.   On: 12/15/2020 18:55    Procedures Procedures (including critical care time)  Medications Ordered in UC Medications - No data to display  Initial Impression / Assessment and Plan / UC Course  I have reviewed the triage vital signs and  the nursing notes.  Pertinent labs & imaging results that were available during my care of the patient were reviewed by me and considered in my medical decision making (see chart for details).     X-ray negative for acute bony abnormality, possible rotator cuff tendinopathy versus sprain.  Providing sling for comfort, but advised against complete reliance on this, encouraged movement,  exercises provided, ice and anti-inflammatories.  Follow-up with sports medicine for further imaging for possible underlying rotator cuff injury if symptoms persisting and not regaining full range of motion.  Discussed strict return precautions. Patient verbalized understanding and is agreeable with plan.  Final Clinical Impressions(s) / UC Diagnoses   Final diagnoses:  Injury of left shoulder and upper arm, initial encounter     Discharge Instructions     X-ray without any fracture Follow-up with orthopedics/sports medicine if continuing to have limited range of motion into next week Ice and gentle range of motion exercises-see attached Naprosyn twice daily for pain     ED Prescriptions    Medication Sig Dispense Auth. Provider   naproxen (NAPROSYN) 500 MG tablet Take 1 tablet (500 mg total) by mouth 2 (two) times daily. 30 tablet Kary Sugrue, [place] C, PA-C     PDMP not reviewed this encounter.   Janith Lima, [place] 12/15/20 1932

## 2020-12-15 NOTE — Discharge Instructions (Addendum)
X-ray without any fracture Follow-up with orthopedics/sports medicine if continuing to have limited range of motion into next week Ice and gentle range of motion exercises-see attached Naprosyn twice daily for pain

## 2020-12-15 NOTE — ED Triage Notes (Signed)
Patient states she fell in her driveway after slipping on ice and now she cannot raise her left arm and has very limited movement. Pt is aox4 and ambulatory.

## 2020-12-18 ENCOUNTER — Other Ambulatory Visit: Payer: Self-pay

## 2020-12-18 ENCOUNTER — Ambulatory Visit: Payer: 59 | Admitting: Orthopedic Surgery

## 2020-12-18 DIAGNOSIS — S46012A Strain of muscle(s) and tendon(s) of the rotator cuff of left shoulder, initial encounter: Secondary | ICD-10-CM | POA: Diagnosis not present

## 2020-12-18 MED FILL — NAPROXEN 500 MG TABLET: 500 | 15 days supply | Qty: 30 | Fill #0

## 2020-12-19 ENCOUNTER — Encounter: Payer: Self-pay | Admitting: Orthopedic Surgery

## 2020-12-19 NOTE — Progress Notes (Signed)
Office Visit Note   Patient: [name]           Date of Birth: 09-12-1961           MRN: [id number] Visit Date: 12/18/2020 Requested by: Crist Infante, MD [address] [place],  [place] [address] PCP: Crist Infante, MD  Subjective: Chief Complaint  Patient presents with  . 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  Landed on left upper extremity.  She is doing well from her right shoulder surgery a year ago.  This was a large rotator cuff tear.  She reports diminished forward flexion and abduction strength.  She has not really been able to improve the pain in her shoulder.  She describes significant functional limitation in that left shoulder.              ROS: All systems reviewed are negative as they relate to the chief complaint within the history of present illness.  Patient denies  fevers or chills.   Assessment & Plan: Visit Diagnoses:  1. Traumatic complete tear of left rotator cuff, initial encounter     Plan: Impression is left shoulder pain with fall and subsequent weakness to supraspinatus and infraspinatus testing.  Highly likely that she has rotator cuff pathology based on presence of weakness as well as the coarse grinding with forward flexion above 90 degrees passively.  Needs MRI arthrogram to evaluate likely large traumatic rotator cuff tear.  Recent studies are clear that surgical intervention gives better outcomes sooner rather than later.  See her back after that study.  Follow-Up Instructions: Return for after MRI.   Orders:  No orders of the defined types were placed in this encounter.  No orders of the defined types were placed in this encounter.     Procedures: No procedures performed   Clinical Data: No additional findings.  Objective: Vital Signs: LMP 11/25/2005   Physical Exam:   Constitutional: Patient appears well-developed HEENT:  Head: Normocephalic Eyes:EOM  are normal Neck: Normal range of motion Cardiovascular: Normal rate Pulmonary/chest: Effort normal Neurologic: Patient is alert Skin: Skin is warm Psychiatric: Patient has normal mood and affect    Ortho Exam: Ortho exam demonstrates full active and passive range of motion of the right shoulder.  Left shoulder has forward flexion and abduction both actively approximately 30 degrees only.  Deltoid functions.  Subscap strength is 5- out of 5 on the left compared to 5 out of 5 on the right.  Infraspinatus supraspinatus strength is about 2 out of 5 on the left-hand side 5 out of 5 on the right.  No AC joint tenderness.  Negative apprehension relocation testing on the left.  Motor sensory function to the hand is intact.  Specialty Comments:  No specialty comments available.  Imaging: No results found.   PMFS History: Patient Active Problem List   Diagnosis Date Noted  . Essential hypertension 06/23/2020  . Prediabetes 06/23/2020  . Family hx-stroke 06/23/2020  . Restless legs syndrome (RLS) 06/23/2020  . Embolic cerebral infarction [facility]) s/p tPA, source unknown  06/21/2020  . Obesity (BMI 30.0-34.9) 09/16/2012  . Hyperlipidemia LDL goal <70 09/06/2008  . FIBROMYALGIA 09/06/2008  . HEADACHE, CHRONIC 09/06/2008  . FIBROIDS, UTERUS 09/01/2008  . Hypothyroidism 09/01/2008  . GERD 09/01/2008   Past Medical History:  Diagnosis Date  . Abnormal Pap smear of cervix   . Anemia 2007  due to fibroids  . Duodenitis without hemorrhage   . Esophageal stricture   . Esophagitis   . GERD (gastroesophageal reflux disease)   . Hiatal hernia   . Hyperlipidemia   . Hypertension   . Hypothyroidism   . Leiomyoma of uterus, unspecified    fibroids  . Pneumonia    aspiration pneumonia after balloon surgery for weight loss    Family History  Problem Relation Age of Onset  . Celiac disease Mother   . Osteoporosis Mother   . Heart disease Father   . Stroke Father   . Irritable bowel  syndrome Sister   . Lung cancer Maternal Grandmother   . Breast cancer Paternal Grandmother   . Heart disease Sister   . Melanoma Sister   . Colon cancer Neg Hx   . Esophageal cancer Neg Hx   . Stomach cancer Neg Hx   . Rectal cancer Neg Hx     Past Surgical History:  Procedure Laterality Date  . BICEPT TENODESIS Right 12/07/2019   Procedure: BICEPS TENODESIS;  Surgeon: Meredith Pel, MD;  Location: [facility];  Service: Orthopedics;  Laterality: Right;  . BUBBLE STUDY  06/23/2020   Procedure: BUBBLE STUDY;  Surgeon: Donato Heinz, MD;  Location: [facility] ENDOSCOPY;  Service: Cardiovascular;;  . CARPAL TUNNEL RELEASE  2014   right wrist  . CHOLECYSTECTOMY    . HYSTEROSCOPY W/ ENDOMETRIAL ABLATION  4/05  . LOOP RECORDER INSERTION N/A 06/23/2020   Procedure: LOOP RECORDER INSERTION;  Surgeon: Constance Haw, MD;  Location: [place] CV LAB;  Service: Cardiovascular;  Laterality: N/A;  . RECTOCELE REPAIR  2007  . SHOULDER ARTHROSCOPY WITH OPEN ROTATOR CUFF REPAIR Right 12/07/2019   Procedure: RIGHT SHOULDER ARTHROSCOPY,   MINI OPEN SUBSCAPULARIS, AND SUPRASPINATUS REPAIR, OPEN BICEPS TENODESIS;  Surgeon: Meredith Pel, MD;  Location: [place];  Service: Orthopedics;  Laterality: Right;  . TEE WITHOUT CARDIOVERSION N/A 06/23/2020   Procedure: TRANSESOPHAGEAL ECHOCARDIOGRAM (TEE);  Surgeon: Donato Heinz, MD;  Location: [facility] ENDOSCOPY;  Service: Cardiovascular;  Laterality: N/A;  . TONSILLECTOMY     as child  . TOTAL VAGINAL HYSTERECTOMY  5/07  . TUBAL LIGATION  1991   BILATERAL   Social History   Occupational History  . Occupation: Nurse  Tobacco Use  . Smoking status: Former Smoker    Quit date: 12/23/2002    Years since quitting: 18.0  . Smokeless tobacco: Never Used  Vaping Use  . Vaping Use: Never used  Substance and Sexual Activity  . Alcohol use: Not Currently  . Drug use: No  . Sexual activity: Yes     Partners: Male    Birth control/protection: Surgical    Comment: [facility]

## 2020-12-21 ENCOUNTER — Telehealth: Payer: Self-pay

## 2020-12-21 ENCOUNTER — Encounter: Payer: Self-pay | Admitting: Orthopedic Surgery

## 2020-12-21 DIAGNOSIS — S46012A Strain of muscle(s) and tendon(s) of the rotator cuff of left shoulder, initial encounter: Secondary | ICD-10-CM

## 2020-12-21 NOTE — Telephone Encounter (Signed)
Sounds good

## 2020-12-21 NOTE — Telephone Encounter (Signed)
Patient sent a secure chat to Dr Marlou Sa today stating her MRI was not scheduled until the end of February.  She wanted to know if this could be done any sooner.  Dr Marlou Sa said he talked with patient and is fairly certain she has a RCT and would like to see if changing scan to non-arthrogram MRI scan would help her to get the scan done any sooner?

## 2020-12-22 NOTE — Telephone Encounter (Signed)
Possibly, but I think MW has arthrogram appts next Wednesday, is she claustro?  Too big for normal MRI?

## 2020-12-22 NOTE — Telephone Encounter (Signed)
See below. Do you know if changing order would get it done any sooner?

## 2020-12-22 NOTE — Telephone Encounter (Signed)
IC patient. She is slighty claustro but has had MRI before without much difficulty.

## 2020-12-25 NOTE — Telephone Encounter (Signed)
Sent to MW, patient aware, I will call pt back with appt date/time, then cancel [place] [facility].

## 2020-12-27 DIAGNOSIS — M25512 Pain in left shoulder: Secondary | ICD-10-CM | POA: Diagnosis not present

## 2021-01-01 ENCOUNTER — Ambulatory Visit (INDEPENDENT_AMBULATORY_CARE_PROVIDER_SITE_OTHER): Payer: 59

## 2021-01-01 DIAGNOSIS — I63411 Cerebral infarction due to embolism of right middle cerebral artery: Secondary | ICD-10-CM

## 2021-01-04 LAB — CUP PACEART REMOTE DEVICE CHECK
Date Time Interrogation Session: 20220210000729
Implantable Pulse Generator Implant Date: 20210730

## 2021-01-08 NOTE — Progress Notes (Signed)
Carelink Summary Report / Loop Recorder 

## 2021-01-09 ENCOUNTER — Other Ambulatory Visit: Payer: Self-pay

## 2021-01-10 ENCOUNTER — Ambulatory Visit: Payer: 59 | Admitting: Orthopedic Surgery

## 2021-01-11 ENCOUNTER — Other Ambulatory Visit (HOSPITAL_COMMUNITY): Payer: Self-pay | Admitting: Internal Medicine

## 2021-01-11 MED FILL — ROSUVASTATIN CALCIUM 10 MG: 10 | 90 days supply | Qty: 90 | Fill #0

## 2021-01-11 MED FILL — DULoxetine HCL 30 MG CPEP: 30 | 90 days supply | Qty: 90 | Fill #0

## 2021-01-13 MED FILL — AIMOVIG 140 MG/ML SOAJ: 140 | 30 days supply | Qty: 1 | Fill #7

## 2021-01-16 ENCOUNTER — Telehealth: Payer: Self-pay | Admitting: Orthopedic Surgery

## 2021-01-16 ENCOUNTER — Encounter (HOSPITAL_BASED_OUTPATIENT_CLINIC_OR_DEPARTMENT_OTHER): Payer: Self-pay | Admitting: Orthopedic Surgery

## 2021-01-16 ENCOUNTER — Other Ambulatory Visit: Payer: Self-pay

## 2021-01-16 NOTE — Telephone Encounter (Signed)
[employer] forms received. Sent to [employer]. 

## 2021-01-17 ENCOUNTER — Other Ambulatory Visit: Payer: 59

## 2021-01-18 ENCOUNTER — Other Ambulatory Visit (HOSPITAL_COMMUNITY): Payer: 59

## 2021-01-18 ENCOUNTER — Other Ambulatory Visit (HOSPITAL_COMMUNITY): Payer: Self-pay | Admitting: Internal Medicine

## 2021-01-18 ENCOUNTER — Telehealth: Payer: Self-pay | Admitting: Orthopedic Surgery

## 2021-01-18 ENCOUNTER — Encounter (HOSPITAL_BASED_OUTPATIENT_CLINIC_OR_DEPARTMENT_OTHER)
Admission: RE | Admit: 2021-01-18 | Discharge: 2021-01-18 | Disposition: A | Discharge status: Home / Self Care | Payer: 59 | Source: Ambulatory Visit | Attending: Orthopedic Surgery | Admitting: Orthopedic Surgery

## 2021-01-18 DIAGNOSIS — Z01812 Encounter for preprocedural laboratory examination: Secondary | ICD-10-CM | POA: Insufficient documentation

## 2021-01-18 LAB — CBC
HCT: 41.3 % (ref 36.0–46.0)
Hemoglobin: 13.8 g/dL (ref 12.0–15.0)
MCH: 28.6 pg (ref 26.0–34.0)
MCHC: 33.4 g/dL (ref 30.0–36.0)
MCV: 85.5 fL (ref 80.0–100.0)
Platelets: 269 10*3/uL (ref 150–400)
RBC: 4.83 MIL/uL (ref 3.87–5.11)
RDW: 16.1 % — ABNORMAL HIGH (ref 11.5–15.5)
WBC: 6.5 10*3/uL (ref 4.0–10.5)
nRBC: 0 % (ref 0.0–0.2)

## 2021-01-18 LAB — BASIC METABOLIC PANEL
Anion gap: 10 (ref 5–15)
BUN: 21 mg/dL — ABNORMAL HIGH (ref 6–20)
CO2: 27 mmol/L (ref 22–32)
Calcium: 9.3 mg/dL (ref 8.9–10.3)
Chloride: 102 mmol/L (ref 98–111)
Creatinine, Ser: 0.82 mg/dL (ref 0.44–1.00)
GFR, Estimated: >60 mL/min (ref >60)
Glucose, Bld: 87 mg/dL (ref 70–99)
Potassium: 3.7 mmol/L (ref 3.5–5.1)
Sodium: 139 mmol/L (ref 135–145)

## 2021-01-18 MED FILL — LOSARTAN POTASSIUM 100 MG T: 100 | 30 days supply | Qty: 30 | Fill #0

## 2021-01-18 MED FILL — CHLORTHALIDONE 25 MG TABS: 25 | 90 days supply | Qty: 90 | Fill #3

## 2021-01-18 NOTE — Telephone Encounter (Signed)
[employer] forms received. Sent to [employer] 

## 2021-01-18 NOTE — Progress Notes (Signed)

## 2021-01-21 ENCOUNTER — Encounter (HOSPITAL_BASED_OUTPATIENT_CLINIC_OR_DEPARTMENT_OTHER): Payer: Self-pay | Admitting: Orthopedic Surgery

## 2021-01-21 NOTE — H&P (Signed)
[name] is an 60 y.o. female.   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  Landed on the left upper extremity.  She had a right shoulder surgery over a year ago with large rotator cuff tears at that time and she is doing well with that surgery.  On the left-hand side she reports diminished forward flexion and abduction strength.  She has not really been able to improve the pain in her shoulder.  She also describes significant functional limitation in the left shoulder.  Subsequent MRI scan does show significant retracted tears of the supraspinatus and infraspinatus.  Past Medical History:  Diagnosis Date  . Abnormal Pap smear of cervix   . Anemia 2007   due to fibroids  . Duodenitis without hemorrhage   . Esophageal stricture   . Esophagitis   . Fibromyalgia   . GERD (gastroesophageal reflux disease)   . Hiatal hernia   . History of loop recorder 06/23/2020   had CVA 06-21-20  . Hyperlipidemia   . Hypertension   . Hypothyroidism   . Leiomyoma of uterus, unspecified    fibroids  . Pneumonia    aspiration pneumonia after balloon surgery for weight loss  . Stroke ([place]) 06/21/2020   Right MCA infarct-recieved TPA    Past Surgical History:  Procedure Laterality Date  . ABDOMINAL HYSTERECTOMY    . BICEPT TENODESIS Right 12/07/2019   Procedure: BICEPS TENODESIS;  Surgeon: Meredith Pel, MD;  Location: [place];  Service: Orthopedics;  Laterality: Right;  . BUBBLE STUDY  06/23/2020   Procedure: BUBBLE STUDY;  Surgeon: Donato Heinz, MD;  Location: [facility] ENDOSCOPY;  Service: Cardiovascular;;  . CARPAL TUNNEL RELEASE  2014   right wrist  . CHOLECYSTECTOMY    . HYSTEROSCOPY W/ ENDOMETRIAL ABLATION  4/05  . LOOP RECORDER INSERTION N/A 06/23/2020   Procedure: LOOP RECORDER INSERTION;  Surgeon: Constance Haw, MD;  Location: [place] CV LAB;  Service:  Cardiovascular;  Laterality: N/A;  . RECTOCELE REPAIR  2007  . SHOULDER ARTHROSCOPY WITH OPEN ROTATOR CUFF REPAIR Right 12/07/2019   Procedure: RIGHT SHOULDER ARTHROSCOPY,   MINI OPEN SUBSCAPULARIS, AND SUPRASPINATUS REPAIR, OPEN BICEPS TENODESIS;  Surgeon: Meredith Pel, MD;  Location: [place];  Service: Orthopedics;  Laterality: Right;  . TEE WITHOUT CARDIOVERSION N/A 06/23/2020   Procedure: TRANSESOPHAGEAL ECHOCARDIOGRAM (TEE);  Surgeon: Donato Heinz, MD;  Location: [facility] ENDOSCOPY;  Service: Cardiovascular;  Laterality: N/A;  . TONSILLECTOMY     as child  . TOTAL VAGINAL HYSTERECTOMY  5/07  . TUBAL LIGATION  1991   BILATERAL    Family History  Problem Relation Age of Onset  . Celiac disease Mother   . Osteoporosis Mother   . Heart disease Father   . Stroke Father   . Irritable bowel syndrome Sister   . Lung cancer Maternal Grandmother   . Breast cancer Paternal Grandmother   . Heart disease Sister   . Melanoma Sister   . Colon cancer Neg Hx   . Esophageal cancer Neg Hx   . Stomach cancer Neg Hx   . Rectal cancer Neg Hx    Social History:  reports that she quit smoking about 18 years ago. She has never used smokeless tobacco. She reports previous alcohol use. She reports that she does not use drugs.  Allergies: No Known Allergies  No medications prior to admission.  No results found for this or any previous visit (from the past 48 hour(s)). No results found.  Review of Systems  Musculoskeletal: Positive for arthralgias.  All other systems reviewed and are negative.   Height 5\' 5"  (1.651 m), weight 83.9 kg, last menstrual period 11/25/2005. Physical Exam Vitals reviewed.  HENT:     Head: Normocephalic.     Nose: Nose normal.     Mouth/Throat:     Mouth: Mucous membranes are moist.  Eyes:     Pupils: Pupils are equal, round, and reactive to light.  Cardiovascular:     Rate and Rhythm: Normal rate.     Pulses: Normal pulses.   Pulmonary:     Effort: Pulmonary effort is normal.  Abdominal:     General: Abdomen is flat.  Musculoskeletal:     Cervical back: Normal range of motion.  Skin:    General: Skin is warm.     Capillary Refill: Capillary refill takes less than 2 seconds.  Neurological:     General: No focal deficit present.     Mental Status: She is alert.  Psychiatric:        Mood and Affect: Mood normal.     Examination of the left shoulder demonstrates full active and passive range of motion of the right shoulder.  Left shoulder has forward flexion and abduction both actively to approximately 30 degrees only.  Deltoid function on the left.  Subscap strength 5- out of 5 on the left compared to 5 out of 5 on the right.  Infraspinatus and supraspinatus strength is 2 out of 5 on the left-hand side.  5 out of 5 on the right.  No AC joint tenderness on the left.  Motor or sensory function to the hand is intact. Assessment/Plan Impression is left shoulder rotator cuff tear with retraction.  Plan is arthroscopy with biceps tenodesis debridement and mini open rotator cuff tear repair.  Risk and benefits of the procedure are discussed with the patient including not limited to infection nerve vessel damage shoulder stiffness as well as prolonged rehabilitative effort.  Overall should be pretty similar to the right shoulder surgery she had.  Anticipate possibly shorter recovery since this is more of an acute injury.  All questions answered.  Anderson Malta, MD 01/21/2021, 11:08 PM

## 2021-01-21 NOTE — Anesthesia Preprocedure Evaluation (Addendum)
Anesthesia Evaluation  Patient identified by MRN, date of birth, ID band Patient awake    Reviewed: Allergy & Precautions, NPO status , Patient's Chart, lab work & pertinent test results  Airway Mallampati: II  TM Distance: >3 FB Neck ROM: Full    Dental no notable dental hx. (+) Teeth Intact   Pulmonary pneumonia, resolved, former smoker,  Covid-19 Dec. 14 2021   Pulmonary exam normal breath sounds clear to auscultation       Cardiovascular hypertension, Pt. on medications Normal cardiovascular exam Rhythm:Regular Rate:Normal  EKG - NSR, RBBB pattern  TTE- essentially normal   Neuro/Psych  Headaches, CVA-  06/21/20 thought to be embolic in nature right MCA S/P TPA administration; thought to be embolic. Loop recorder has shown no evidence of A. Fibrillation Residual left ring and middle finger tip numbness  Neuromuscular disease CVA, Residual Symptoms negative psych ROS   GI/Hepatic Neg liver ROS, hiatal hernia, GERD  Medicated and Controlled,Hx/o esophageal stricture- has not had dilation in past   Endo/Other  Hypothyroidism Obesity Hyperlipidemia  Renal/GU negative Renal ROS  negative genitourinary   Musculoskeletal  (+) Fibromyalgia -Torn rotator cuff left shoulder   Abdominal (+) + obese,   Peds  Hematology  (+) anemia ,   Anesthesia Other Findings   Reproductive/Obstetrics                                                            Anesthesia Evaluation  Patient identified by MRN, date of birth, ID band Patient awake    Reviewed: Allergy & Precautions, NPO status , Patient's Chart, lab work & pertinent test results  Airway Mallampati: II  TM Distance: >3 FB Neck ROM: Full    Dental no notable dental hx. (+) Teeth Intact, Dental Advisory Given   Pulmonary pneumonia, resolved, former smoker,    Pulmonary exam normal breath sounds clear to auscultation        Cardiovascular hypertension, Pt. on medications Normal cardiovascular exam Rhythm:Regular Rate:Normal  EKG - NSR, RBBB pattern  TTE- essentially normal   Neuro/Psych  Headaches, CVA- thought to be embolic in nature  Neuromuscular disease CVA, Residual Symptoms negative psych ROS   GI/Hepatic Neg liver ROS, hiatal hernia, GERD  Medicated and Controlled,Hx/o esophageal stricture- has not had dilation in past   Endo/Other  Hypothyroidism Obesity Hyperlipidemia  Renal/GU negative Renal ROS  negative genitourinary   Musculoskeletal  (+) Fibromyalgia -  Abdominal (+) + obese,   Peds  Hematology  (+) anemia ,   Anesthesia Other Findings   Reproductive/Obstetrics                           Anesthesia Physical Anesthesia Plan  ASA: III  Anesthesia Plan: MAC   Post-op Pain Management:    Induction: Intravenous  PONV Risk Score and Plan: 2  Airway Management Planned: Natural Airway and Nasal Cannula  Additional Equipment:   Intra-op Plan:   Post-operative Plan:   Informed Consent: I have reviewed the patients History and Physical, chart, labs and discussed the procedure including the risks, benefits and alternatives for the proposed anesthesia with the patient or authorized representative who has indicated his/her understanding and acceptance.     Dental advisory given  Plan Discussed with: CRNA and Anesthesiologist  Anesthesia Plan Comments:         Anesthesia Quick Evaluation  Anesthesia Physical  Anesthesia Plan  ASA: III  Anesthesia Plan: General   Post-op Pain Management:  Regional for Post-op pain   Induction: Intravenous  PONV Risk Score and Plan: 4 or greater and Treatment may vary due to age or medical condition and Ondansetron  Airway Management Planned: Oral ETT  Additional Equipment: None  Intra-op Plan:   Post-operative Plan: Extubation in OR  Informed Consent: I have reviewed the patients History  and Physical, chart, labs and discussed the procedure including the risks, benefits and alternatives for the proposed anesthesia with the patient or authorized representative who has indicated his/her understanding and acceptance.     Dental advisory given  Plan Discussed with: CRNA and Anesthesiologist  Anesthesia Plan Comments:        Anesthesia Quick Evaluation

## 2021-01-22 ENCOUNTER — Other Ambulatory Visit: Payer: Self-pay

## 2021-01-22 ENCOUNTER — Ambulatory Visit (HOSPITAL_BASED_OUTPATIENT_CLINIC_OR_DEPARTMENT_OTHER): Payer: 59 | Admitting: Anesthesiology

## 2021-01-22 ENCOUNTER — Encounter (HOSPITAL_BASED_OUTPATIENT_CLINIC_OR_DEPARTMENT_OTHER): Payer: Self-pay | Admitting: Orthopedic Surgery

## 2021-01-22 ENCOUNTER — Ambulatory Visit (HOSPITAL_BASED_OUTPATIENT_CLINIC_OR_DEPARTMENT_OTHER)
Admission: RE | Admit: 2021-01-22 | Discharge: 2021-01-22 | Disposition: A | Discharge status: Home / Self Care | Payer: 59 | Attending: Orthopedic Surgery | Admitting: Orthopedic Surgery

## 2021-01-22 ENCOUNTER — Other Ambulatory Visit: Payer: Self-pay | Admitting: Orthopedic Surgery

## 2021-01-22 ENCOUNTER — Encounter (HOSPITAL_BASED_OUTPATIENT_CLINIC_OR_DEPARTMENT_OTHER)
Admission: RE | Disposition: A | Discharge status: Home / Self Care | Payer: Self-pay | Source: Home / Self Care | Attending: Orthopedic Surgery

## 2021-01-22 DIAGNOSIS — W000XXA Fall on same level due to ice and snow, initial encounter: Secondary | ICD-10-CM | POA: Diagnosis not present

## 2021-01-22 DIAGNOSIS — Z8262 Family history of osteoporosis: Secondary | ICD-10-CM | POA: Diagnosis not present

## 2021-01-22 DIAGNOSIS — Z801 Family history of malignant neoplasm of trachea, bronchus and lung: Secondary | ICD-10-CM | POA: Diagnosis not present

## 2021-01-22 DIAGNOSIS — E669 Obesity, unspecified: Secondary | ICD-10-CM | POA: Insufficient documentation

## 2021-01-22 DIAGNOSIS — Z803 Family history of malignant neoplasm of breast: Secondary | ICD-10-CM | POA: Diagnosis not present

## 2021-01-22 DIAGNOSIS — Z8379 Family history of other diseases of the digestive system: Secondary | ICD-10-CM | POA: Insufficient documentation

## 2021-01-22 DIAGNOSIS — S46012D Strain of muscle(s) and tendon(s) of the rotator cuff of left shoulder, subsequent encounter: Secondary | ICD-10-CM | POA: Diagnosis not present

## 2021-01-22 DIAGNOSIS — Z808 Family history of malignant neoplasm of other organs or systems: Secondary | ICD-10-CM | POA: Diagnosis not present

## 2021-01-22 DIAGNOSIS — Z6829 Body mass index (BMI) 29.0-29.9, adult: Secondary | ICD-10-CM | POA: Diagnosis not present

## 2021-01-22 DIAGNOSIS — Z87891 Personal history of nicotine dependence: Secondary | ICD-10-CM | POA: Diagnosis not present

## 2021-01-22 DIAGNOSIS — S46012A Strain of muscle(s) and tendon(s) of the rotator cuff of left shoulder, initial encounter: Secondary | ICD-10-CM | POA: Diagnosis not present

## 2021-01-22 DIAGNOSIS — M7522 Bicipital tendinitis, left shoulder: Secondary | ICD-10-CM | POA: Diagnosis not present

## 2021-01-22 DIAGNOSIS — K449 Diaphragmatic hernia without obstruction or gangrene: Secondary | ICD-10-CM | POA: Diagnosis not present

## 2021-01-22 DIAGNOSIS — Z8673 Personal history of transient ischemic attack (TIA), and cerebral infarction without residual deficits: Secondary | ICD-10-CM | POA: Insufficient documentation

## 2021-01-22 DIAGNOSIS — I10 Essential (primary) hypertension: Secondary | ICD-10-CM | POA: Diagnosis not present

## 2021-01-22 DIAGNOSIS — Z8249 Family history of ischemic heart disease and other diseases of the circulatory system: Secondary | ICD-10-CM | POA: Insufficient documentation

## 2021-01-22 DIAGNOSIS — G8918 Other acute postprocedural pain: Secondary | ICD-10-CM | POA: Diagnosis not present

## 2021-01-22 DIAGNOSIS — Z8616 Personal history of COVID-19: Secondary | ICD-10-CM | POA: Diagnosis not present

## 2021-01-22 DIAGNOSIS — M75102 Unspecified rotator cuff tear or rupture of left shoulder, not specified as traumatic: Secondary | ICD-10-CM | POA: Diagnosis not present

## 2021-01-22 HISTORY — PX: SHOULDER ARTHROSCOPY WITH ROTATOR CUFF REPAIR AND OPEN BICEPS TENODESIS: SHX6677

## 2021-01-22 HISTORY — DX: Fibromyalgia: M79.7

## 2021-01-22 SURGERY — SHOULDER ARTHROSCOPY WITH ROTATOR CUFF REPAIR AND OPEN BICEPS TENODESIS
Anesthesia: General | Site: Shoulder | Laterality: Left

## 2021-01-22 MED ORDER — OXYCODONE HCL 5 MG PO TABS: 5.0000 mg | ORAL_TABLET | ORAL | 0 refills | Status: DC | PRN

## 2021-01-22 MED ORDER — EPHEDRINE SULFATE-NACL 50-0.9 MG/10ML-% IV SOSY
PREFILLED_SYRINGE | INTRAVENOUS | Status: DC | PRN
  Administered 2021-01-22: 10 mg via INTRAVENOUS
  Administered 2021-01-22 (×2): 5 mg via INTRAVENOUS

## 2021-01-22 MED ORDER — SUGAMMADEX SODIUM 200 MG/2ML IV SOLN
INTRAVENOUS | Status: DC | PRN
  Administered 2021-01-22: 150 mg via INTRAVENOUS

## 2021-01-22 MED ORDER — LIDOCAINE 2% (20 MG/ML) 5 ML SYRINGE
INTRAMUSCULAR | Status: DC | PRN
  Administered 2021-01-22: 50 mg via INTRAVENOUS

## 2021-01-22 MED ORDER — FENTANYL CITRATE (PF) 100 MCG/2ML IJ SOLN
INTRAMUSCULAR | Status: AC
  Filled 2021-01-22: qty 2

## 2021-01-22 MED ORDER — BUPIVACAINE-EPINEPHRINE (PF) 0.5% -1:200000 IJ SOLN
INTRAMUSCULAR | Status: DC | PRN
  Administered 2021-01-22: 20 mL via PERINEURAL

## 2021-01-22 MED ORDER — MIDAZOLAM HCL 2 MG/2ML IJ SOLN
INTRAMUSCULAR | Status: AC
  Filled 2021-01-22: qty 2

## 2021-01-22 MED ORDER — DEXAMETHASONE SODIUM PHOSPHATE 10 MG/ML IJ SOLN
INTRAMUSCULAR | Status: AC
  Filled 2021-01-22: qty 1

## 2021-01-22 MED ORDER — DEXAMETHASONE SODIUM PHOSPHATE 10 MG/ML IJ SOLN
INTRAMUSCULAR | Status: DC | PRN
  Administered 2021-01-22 (×2): 5 mg via INTRAVENOUS

## 2021-01-22 MED ORDER — PHENYLEPHRINE 40 MCG/ML (10ML) SYRINGE FOR IV PUSH (FOR BLOOD PRESSURE SUPPORT)
PREFILLED_SYRINGE | INTRAVENOUS | Status: DC | PRN
  Administered 2021-01-22: 40 ug via INTRAVENOUS
  Administered 2021-01-22: 80 ug via INTRAVENOUS
  Administered 2021-01-22: 40 ug via INTRAVENOUS

## 2021-01-22 MED ORDER — ONDANSETRON HCL 4 MG/2ML IJ SOLN
INTRAMUSCULAR | Status: DC | PRN
  Administered 2021-01-22: 4 mg via INTRAVENOUS

## 2021-01-22 MED ORDER — ROCURONIUM BROMIDE 10 MG/ML (PF) SYRINGE
PREFILLED_SYRINGE | INTRAVENOUS | Status: AC
  Filled 2021-01-22: qty 10

## 2021-01-22 MED ORDER — PROPOFOL 10 MG/ML IV BOLUS
INTRAVENOUS | Status: DC | PRN
  Administered 2021-01-22: 120 mg via INTRAVENOUS

## 2021-01-22 MED ORDER — ONDANSETRON HCL 4 MG/2ML IJ SOLN: 4.0000 mg | Freq: Once | INTRAMUSCULAR | Status: DC | PRN

## 2021-01-22 MED ORDER — PHENYLEPHRINE 40 MCG/ML (10ML) SYRINGE FOR IV PUSH (FOR BLOOD PRESSURE SUPPORT)
PREFILLED_SYRINGE | INTRAVENOUS | Status: AC
  Filled 2021-01-22: qty 10

## 2021-01-22 MED ORDER — BUPIVACAINE LIPOSOME 1.3 % IJ SUSP
INTRAMUSCULAR | Status: DC | PRN
  Administered 2021-01-22: 10 mL via PERINEURAL

## 2021-01-22 MED ORDER — MIDAZOLAM HCL 2 MG/2ML IJ SOLN
2.0000 mg | Freq: Once | INTRAMUSCULAR | Status: AC
  Administered 2021-01-22: 2 mg via INTRAVENOUS

## 2021-01-22 MED ORDER — LACTATED RINGERS IV SOLN: INTRAVENOUS | Status: DC

## 2021-01-22 MED ORDER — FENTANYL CITRATE (PF) 100 MCG/2ML IJ SOLN
INTRAMUSCULAR | Status: DC | PRN
  Administered 2021-01-22 (×2): 25 ug via INTRAVENOUS

## 2021-01-22 MED ORDER — SCOPOLAMINE 1 MG/3DAYS TD PT72
1.0000 | MEDICATED_PATCH | TRANSDERMAL | Status: DC
  Administered 2021-01-22: 1.5 mg via TRANSDERMAL

## 2021-01-22 MED ORDER — POVIDONE-IODINE 10 % EX SWAB
2.0000 "application " | Freq: Once | CUTANEOUS | Status: AC
  Administered 2021-01-22: 2 via TOPICAL

## 2021-01-22 MED ORDER — OXYCODONE HCL 5 MG PO TABS: 5.0000 mg | ORAL_TABLET | Freq: Once | ORAL | Status: DC | PRN

## 2021-01-22 MED ORDER — SCOPOLAMINE 1 MG/3DAYS TD PT72
MEDICATED_PATCH | TRANSDERMAL | Status: AC
  Filled 2021-01-22: qty 1

## 2021-01-22 MED ORDER — CEFAZOLIN SODIUM-DEXTROSE 2-4 GM/100ML-% IV SOLN
INTRAVENOUS | Status: AC
  Filled 2021-01-22: qty 100

## 2021-01-22 MED ORDER — POVIDONE-IODINE 7.5 % EX SOLN: Freq: Once | CUTANEOUS | Status: AC

## 2021-01-22 MED ORDER — ROCURONIUM BROMIDE 100 MG/10ML IV SOLN
INTRAVENOUS | Status: DC | PRN
  Administered 2021-01-22: 60 mg via INTRAVENOUS
  Administered 2021-01-22 (×2): 10 mg via INTRAVENOUS

## 2021-01-22 MED ORDER — LIDOCAINE 2% (20 MG/ML) 5 ML SYRINGE
INTRAMUSCULAR | Status: AC
  Filled 2021-01-22: qty 5

## 2021-01-22 MED ORDER — PROPOFOL 10 MG/ML IV BOLUS
INTRAVENOUS | Status: AC
  Filled 2021-01-22: qty 40

## 2021-01-22 MED ORDER — ONDANSETRON HCL 4 MG/2ML IJ SOLN
INTRAMUSCULAR | Status: AC
  Filled 2021-01-22: qty 2

## 2021-01-22 MED ORDER — PHENYLEPHRINE HCL-NACL 10-0.9 MG/250ML-% IV SOLN
INTRAVENOUS | Status: DC | PRN
  Administered 2021-01-22: 20 ug/min via INTRAVENOUS

## 2021-01-22 MED ORDER — FENTANYL CITRATE (PF) 100 MCG/2ML IJ SOLN
100.0000 ug | Freq: Once | INTRAMUSCULAR | Status: AC
  Administered 2021-01-22: 50 ug via INTRAVENOUS

## 2021-01-22 MED ORDER — METHOCARBAMOL 500 MG PO TABS: 500.0000 mg | ORAL_TABLET | Freq: Three times a day (TID) | ORAL | 0 refills | Status: DC | PRN

## 2021-01-22 MED ORDER — BUPIVACAINE HCL (PF) 0.5 % IJ SOLN
INTRAMUSCULAR | Status: AC
  Filled 2021-01-22: qty 30

## 2021-01-22 MED ORDER — MEPERIDINE HCL 25 MG/ML IJ SOLN: 6.2500 mg | INTRAMUSCULAR | Status: DC | PRN

## 2021-01-22 MED ORDER — CEFAZOLIN SODIUM-DEXTROSE 2-4 GM/100ML-% IV SOLN
2.0000 g | INTRAVENOUS | Status: AC
  Administered 2021-01-22: 2 g via INTRAVENOUS

## 2021-01-22 MED ORDER — PHENYLEPHRINE HCL (PRESSORS) 10 MG/ML IV SOLN
INTRAVENOUS | Status: AC
  Filled 2021-01-22: qty 1

## 2021-01-22 MED ORDER — EPHEDRINE 5 MG/ML INJ
INTRAVENOUS | Status: AC
  Filled 2021-01-22: qty 10

## 2021-01-22 MED ORDER — FENTANYL CITRATE (PF) 100 MCG/2ML IJ SOLN: 25.0000 ug | INTRAMUSCULAR | Status: DC | PRN

## 2021-01-22 MED ORDER — OXYCODONE HCL 5 MG/5ML PO SOLN: 5.0000 mg | Freq: Once | ORAL | Status: DC | PRN

## 2021-01-22 MED FILL — oxyCODONE HCL 5 MG TABS: 5 | 7 days supply | Qty: 45 | Fill #0

## 2021-01-22 MED FILL — METHOCARBAMOL 500 MG TABS: 500 | 10 days supply | Qty: 30 | Fill #0

## 2021-01-22 SURGICAL SUPPLY — 91 items
AID PSTN UNV HD RSTRNT DISP (MISCELLANEOUS) ×1
ANCH SUT 2 SWLK 19.1 CLS EYLT (Anchor) ×3 IMPLANT
ANCH SUT FBRTK 1.3 2 TPE (Anchor) ×4 IMPLANT
ANCHOR FBRTK 2.6 SUTURETAP 1.3 (Anchor) ×4 IMPLANT
ANCHOR SUT 1.8 FBRTK KNTLS 2SU (Anchor) ×2 IMPLANT
ANCHOR SWIVELOCK BIO 4.75X19.1 (Anchor) ×3 IMPLANT
BLADE EXCALIBUR 4.0X13 (MISCELLANEOUS) IMPLANT
BLADE SHAVER BONE 5.0X13 (MISCELLANEOUS) IMPLANT
BLADE SURG 10 STRL SS (BLADE) IMPLANT
BLADE SURG 15 STRL LF DISP TIS (BLADE) IMPLANT
BLADE SURG 15 STRL SS (BLADE) ×4
BURR OVAL 8 FLU 5.0X13 (MISCELLANEOUS) IMPLANT
CANNULA 5.75X71 LONG (CANNULA) IMPLANT
CANNULA TWIST IN 8.25X7CM (CANNULA) IMPLANT
COOLER ICEMAN CLASSIC (MISCELLANEOUS) ×2 IMPLANT
COVER WAND RF STERILE (DRAPES) IMPLANT
DECANTER SPIKE VIAL GLASS SM (MISCELLANEOUS) IMPLANT
DISSECTOR  3.8MM X 13CM (MISCELLANEOUS)
DISSECTOR 3.8MM X 13CM (MISCELLANEOUS) IMPLANT
DISSECTOR 4.0MM X 13CM (MISCELLANEOUS) ×2 IMPLANT
DRAPE IMP U-DRAPE 54X76 (DRAPES) ×1 IMPLANT
DRAPE INCISE IOBAN 66X45 STRL (DRAPES) ×4 IMPLANT
DRAPE STERI 35X30 U-POUCH (DRAPES) ×2 IMPLANT
DRAPE U-SHAPE 47X51 STRL (DRAPES) ×4 IMPLANT
DRAPE U-SHAPE 76X120 STRL (DRAPES) ×4 IMPLANT
DRSG AQUACEL AG ADV 3.5X 6 (GAUZE/BANDAGES/DRESSINGS) IMPLANT
DRSG TEGADERM 4X4.75 (GAUZE/BANDAGES/DRESSINGS) IMPLANT
DURAPREP 26ML APPLICATOR (WOUND CARE) ×2 IMPLANT
DW OUTFLOW CASSETTE/TUBE SET (MISCELLANEOUS) ×2 IMPLANT
ELECT REM PT RETURN 9FT ADLT (ELECTROSURGICAL) ×2
ELECTRODE REM PT RTRN 9FT ADLT (ELECTROSURGICAL) ×1 IMPLANT
EXCALIBUR 3.8MM X 13CM (MISCELLANEOUS) IMPLANT
GAUZE SPONGE 4X4 12PLY STRL (GAUZE/BANDAGES/DRESSINGS) ×2 IMPLANT
GAUZE XEROFORM 1X8 LF (GAUZE/BANDAGES/DRESSINGS) ×2 IMPLANT
GLOVE SRG 8 PF TXTR STRL LF DI (GLOVE) ×1 IMPLANT
GLOVE SURG ENC MOIS LTX SZ7 (GLOVE) ×2 IMPLANT
GLOVE SURG ORTHO LTX SZ8 (GLOVE) ×2 IMPLANT
GLOVE SURG UNDER POLY LF SZ7 (GLOVE) ×2 IMPLANT
GLOVE SURG UNDER POLY LF SZ8 (GLOVE) ×4
GOWN STRL REUS W/ TWL LRG LVL3 (GOWN DISPOSABLE) ×2 IMPLANT
GOWN STRL REUS W/ TWL XL LVL3 (GOWN DISPOSABLE) ×1 IMPLANT
GOWN STRL REUS W/TWL LRG LVL3 (GOWN DISPOSABLE) ×4
GOWN STRL REUS W/TWL XL LVL3 (GOWN DISPOSABLE) ×4
KIT STR SPEAR 1.8 FBRTK DISP (KITS) ×1 IMPLANT
MANIFOLD NEPTUNE II (INSTRUMENTS) ×2 IMPLANT
NDL SAFETY ECLIPSE 18X1.5 (NEEDLE) ×1 IMPLANT
NDL SCORPION MULTI FIRE (NEEDLE) IMPLANT
NDL SUT 6 .5 CRC .975X.05 MAYO (NEEDLE) IMPLANT
NEEDLE HYPO 18GX1.5 SHARP (NEEDLE) ×2
NEEDLE MAYO TAPER (NEEDLE) ×2
NEEDLE SCORPION MULTI FIRE (NEEDLE) ×2 IMPLANT
NS IRRIG 1000ML POUR BTL (IV SOLUTION) IMPLANT
PACK ARTHROSCOPY DSU (CUSTOM PROCEDURE TRAY) ×2 IMPLANT
PACK BASIN DAY SURGERY FS (CUSTOM PROCEDURE TRAY) ×2 IMPLANT
PAD COLD SHLDR UNI WRAP-ON (PAD) ×2
PAD COLD SHLDR WRAP-ON (PAD) ×2 IMPLANT
PAD COLD UNI WRAP-ON (PAD) IMPLANT
PENCIL SMOKE EVACUATOR (MISCELLANEOUS) ×2 IMPLANT
PORT APPOLLO RF 90DEGREE MULTI (SURGICAL WAND) ×2 IMPLANT
RESTRAINT HEAD UNIVERSAL NS (MISCELLANEOUS) ×2 IMPLANT
SLEEVE SCD COMPRESS KNEE MED (STOCKING) ×2 IMPLANT
SLING ARM FOAM STRAP LRG (SOFTGOODS) ×1 IMPLANT
SPONGE LAP 4X18 RFD (DISPOSABLE) ×1 IMPLANT
SPONGE SURGIFOAM ABS GEL 12-7 (HEMOSTASIS) IMPLANT
STRIP CLOSURE SKIN 1/2X4 (GAUZE/BANDAGES/DRESSINGS) IMPLANT
SUCTION FRAZIER HANDLE 10FR (MISCELLANEOUS) ×2
SUCTION TUBE FRAZIER 10FR DISP (MISCELLANEOUS) ×1 IMPLANT
SUT BONE WAX W31G (SUTURE) IMPLANT
SUT ETHIBOND 2 OS 4 DA (SUTURE) IMPLANT
SUT ETHILON 3 0 PS 1 (SUTURE) ×2 IMPLANT
SUT FIBERWIRE #2 38 T-5 BLUE (SUTURE)
SUT FIBERWIRE 2-0 18 17.9 3/8 (SUTURE)
SUT MNCRL AB 3-0 PS2 18 (SUTURE) ×2 IMPLANT
SUT PDS AB 0 CT 36 (SUTURE) IMPLANT
SUT TICRON 1 T 12 (SUTURE) IMPLANT
SUT VIC AB 0 CT1 27 (SUTURE)
SUT VIC AB 0 CT1 27XCR 8 STRN (SUTURE) IMPLANT
SUT VIC AB 1 CT1 27 (SUTURE) ×2
SUT VIC AB 1 CT1 27XBRD ANBCTR (SUTURE) IMPLANT
SUT VIC AB 2-0 SH 27 (SUTURE) ×2
SUT VIC AB 2-0 SH 27XBRD (SUTURE) IMPLANT
SUT VICRYL 0 UR6 27IN ABS (SUTURE) ×3 IMPLANT
SUT VICRYL 4-0 PS2 18IN ABS (SUTURE) ×2 IMPLANT
SUTURE FIBERWR #2 38 T-5 BLUE (SUTURE) IMPLANT
SUTURE FIBERWR 2-0 18 17.9 3/8 (SUTURE) IMPLANT
SYR 5ML LL (SYRINGE) IMPLANT
SYR BULB IRRIG 60ML STRL (SYRINGE) IMPLANT
TOWEL GREEN STERILE FF (TOWEL DISPOSABLE) ×2 IMPLANT
TUBE CONNECTING 20X1/4 (TUBING) ×2 IMPLANT
TUBING ARTHROSCOPY IRRIG 16FT (MISCELLANEOUS) ×2 IMPLANT
YANKAUER SUCT BULB TIP NO VENT (SUCTIONS) IMPLANT

## 2021-01-22 NOTE — Transfer of Care (Signed)
Immediate Anesthesia Transfer of Care Note  Patient: [name]  Procedure(s) Performed: left shoulder arthroscopy debridement, mini open rotator cuff tear repair, biceps tenodesis (Left Shoulder)  Patient Location: PACU  Anesthesia Type:General and Regional  Level of Consciousness: drowsy and patient cooperative  Airway & Oxygen Therapy: Patient Spontanous Breathing and Patient connected to face mask oxygen  Post-op Assessment: Report given to RN and Post -op Vital signs reviewed and stable  Post vital signs: Reviewed and stable  Last Vitals:  Vitals Value Taken Time  BP    Temp    Pulse 99 01/22/21 1054  Resp 15 01/22/21 1054  SpO2 100 % 01/22/21 1054  Vitals shown include unvalidated device data.  Last Pain:  Vitals:   01/22/21 1048  TempSrc:   PainSc: Asleep      Patients Stated Pain Goal: 5 (37/09/64 3838)  Complications: No complications documented.

## 2021-01-22 NOTE — Anesthesia Procedure Notes (Signed)
Procedure Name: Intubation Date/Time: 01/22/2021 7:39 AM Performed by: Gwyndolyn Saxon, CRNA Pre-anesthesia Checklist: Patient identified, Emergency Drugs available, Suction available and Patient being monitored Patient Re-evaluated:Patient Re-evaluated prior to induction Oxygen Delivery Method: Circle system utilized Preoxygenation: Pre-oxygenation with 100% oxygen Induction Type: IV induction Ventilation: Mask ventilation without difficulty and Oral airway inserted - appropriate to patient size Laryngoscope Size: Sabra Heck and 2 Grade View: Grade II Tube type: Oral Tube size: 7.0 mm Number of attempts: 1 Airway Equipment and Method: Patient positioned with wedge pillow and Stylet Placement Confirmation: ETT inserted through vocal cords under direct vision,  positive ETCO2 and breath sounds checked- equal and bilateral Secured at: 21 cm Tube secured with: Tape Dental Injury: Teeth and Oropharynx as per pre-operative assessment

## 2021-01-22 NOTE — Anesthesia Postprocedure Evaluation (Signed)
Anesthesia Post Note  Patient: [name]  Procedure(s) Performed: left shoulder arthroscopy debridement, mini open rotator cuff tear repair, biceps tenodesis (Left Shoulder)     Patient location during evaluation: PACU Anesthesia Type: General Level of consciousness: awake and alert and oriented Pain management: pain level controlled Vital Signs Assessment: post-procedure vital signs reviewed and stable Respiratory status: spontaneous breathing, nonlabored ventilation and respiratory function stable Cardiovascular status: blood pressure returned to baseline and stable Postop Assessment: no apparent nausea or vomiting Anesthetic complications: no   No complications documented.  Last Vitals:  Vitals:   01/22/21 1048 01/22/21 1115  BP: 130/85 119/82  Pulse: (!) 109 92  Resp: 11 13  Temp: 36.6 C   SpO2: 100% 95%    Last Pain:  Vitals:   01/22/21 1130  TempSrc:   PainSc: 0-No pain                 Shakirah Kirkey A.

## 2021-01-22 NOTE — Anesthesia Procedure Notes (Addendum)
Anesthesia Regional Block: Interscalene brachial plexus block   Pre-Anesthetic Checklist: ,, timeout performed, Correct Patient, Correct Site, Correct Laterality, Correct Procedure, Correct Position, site marked, Risks and benefits discussed,  Surgical consent,  Pre-op evaluation,  At surgeon's request and post-op pain management  Laterality: Left  Prep: chloraprep       Needles:  Injection technique: Single-shot  Needle Type: Echogenic Stimulator Needle     Needle Length: 9cm  Needle Gauge: 21   Needle insertion depth: 6 cm   Additional Needles:   Procedures:,,,, ultrasound used (permanent image in chart),,,,  Narrative:  Start time: 01/22/2021 7:08 AM End time: 01/22/2021 7:13 AM Injection made incrementally with aspirations every 5 mL.  Performed by: Personally  Anesthesiologist: Josephine Igo, MD  Additional Notes: Timeout performed. Patient sedated. Relevant anatomy ID'd using [place]. Incremental 2-74ml injection of LA with frequent aspiration. Patient tolerated procedure well. Unable to upload picture in [employer].        Left interscalene block

## 2021-01-22 NOTE — Progress Notes (Signed)
Assisted Dr. Foster with left, ultrasound guided, interscalene  block. Side rails up, monitors on throughout procedure. See vital signs in flow sheet. Tolerated Procedure well.  

## 2021-01-22 NOTE — Brief Op Note (Signed)
   01/22/2021  10:52 AM  PATIENT:  [name]  60 y.o. female  PRE-OPERATIVE DIAGNOSIS:  left shoulder rotator cuff tear  POST-OPERATIVE DIAGNOSIS:  left shoulder rotator cuff tear  PROCEDURE:  Procedure(s): left shoulder arthroscopy debridement, mini open rotator cuff tear repair, biceps tenodesis  SURGEON:  Surgeon(s): Meredith Pel, MD  ASSISTANT: April green rnfa  ANESTHESIA:   general  EBL: 25 ml    Total I/O In: 1100 [I.V.:1000; IV Piggyback:100] Out: 25 [Blood:25]  BLOOD ADMINISTERED: none  DRAINS: none   LOCAL MEDICATIONS USED:  none  SPECIMEN:  No Specimen  COUNTS:  YES  TOURNIQUET:  * No tourniquets in log *  DICTATION: .Other Dictation: Dictation Number [id number]  PLAN OF CARE: Discharge to home after PACU  PATIENT DISPOSITION:  PACU - hemodynamically stable

## 2021-01-22 NOTE — Discharge Instructions (Signed)
Information for Discharge Teaching: EXPAREL (bupivacaine liposome injectable suspension)   Your surgeon or anesthesiologist gave you EXPAREL(bupivacaine) to help control your pain after surgery.   EXPAREL is a local anesthetic that provides pain relief by numbing the tissue around the surgical site.  EXPAREL is designed to release pain medication over time and can control pain for up to 72 hours.  Depending on how you respond to EXPAREL, you may require less pain medication during your recovery.  Possible side effects:  Temporary loss of sensation or ability to move in the area where bupivacaine was injected.  Nausea, vomiting, constipation  Rarely, numbness and tingling in your mouth or lips, lightheadedness, or anxiety may occur.  Call your doctor right away if you think you may be experiencing any of these sensations, or if you have other questions regarding possible side effects.  Follow all other discharge instructions given to you by your surgeon or nurse. Eat a healthy diet and drink plenty of water or other fluids.  If you return to the hospital for any reason within 96 hours following the administration of EXPAREL, it is important for health care providers to know that you have received this anesthetic. A teal colored band has been placed on your arm with the date, time and amount of EXPAREL you have received in order to alert and inform your health care providers. Please leave this armband in place for the full 96 hours following administration, and then you may remove the band.    Regional Anesthesia Blocks  1. Numbness or the inability to move the "blocked" extremity may last from 3-48 hours after placement. The length of time depends on the medication injected and your individual response to the medication. If the numbness is not going away after 48 hours, call your surgeon.  2. The extremity that is blocked will need to be protected until the numbness is gone and  the  Strength has returned. Because you cannot feel it, you will need to take extra care to avoid injury. Because it may be weak, you may have difficulty moving it or using it. You may not know what position it is in without looking at it while the block is in effect.  3. For blocks in the legs and feet, returning to weight bearing and walking needs to be done carefully. You will need to wait until the numbness is entirely gone and the strength has returned. You should be able to move your leg and foot normally before you try and bear weight or walk. You will need someone to be with you when you first try to ensure you do not fall and possibly risk injury.  4. Bruising and tenderness at the needle site are common side effects and will resolve in a few days.  5. Persistent numbness or new problems with movement should be communicated to the surgeon or the [facility] [phone] [place] [phone]).     Post Anesthesia Home Care Instructions  Activity: Get plenty of rest for the remainder of the day. A responsible individual must stay with you for 24 hours following the procedure.  For the next 24 hours, DO NOT: -Drive a car -Paediatric nurse -Drink alcoholic beverages -Take any medication unless instructed by your physician -Make any legal decisions or sign important papers.  Meals: Start with liquid foods such as gelatin or soup. Progress to regular foods as tolerated. Avoid greasy, spicy, heavy foods. If nausea and/or vomiting  occur, drink only clear liquids until the nausea and/or vomiting subsides. Call your physician if vomiting continues.  Special Instructions/Symptoms: Your throat may feel dry or sore from the anesthesia or the breathing tube placed in your throat during surgery. If this causes discomfort, gargle with warm salt water. The discomfort should disappear within 24 hours.  If you had a scopolamine patch placed behind your ear for the  management of post- operative nausea and/or vomiting:  1. The medication in the patch is effective for 72 hours, after which it should be removed.  Wrap patch in a tissue and discard in the trash. Wash hands thoroughly with soap and water. 2. You may remove the patch earlier than 72 hours if you experience unpleasant side effects which may include dry mouth, dizziness or visual disturbances. 3. Avoid touching the patch. Wash your hands with soap and water after contact with the patch.    Post Anesthesia Home Care Instructions  Activity: Get plenty of rest for the remainder of the day. A responsible individual must stay with you for 24 hours following the procedure.  For the next 24 hours, DO NOT: -Drive a car -Paediatric nurse -Drink alcoholic beverages -Take any medication unless instructed by your physician -Make any legal decisions or sign important papers.  Meals: Start with liquid foods such as gelatin or soup. Progress to regular foods as tolerated. Avoid greasy, spicy, heavy foods. If nausea and/or vomiting occur, drink only clear liquids until the nausea and/or vomiting subsides. Call your physician if vomiting continues.  Special Instructions/Symptoms: Your throat may feel dry or sore from the anesthesia or the breathing tube placed in your throat during surgery. If this causes discomfort, gargle with warm salt water. The discomfort should disappear within 24 hours.  If you had a scopolamine patch placed behind your ear for the management of post- operative nausea and/or vomiting:  1. The medication in the patch is effective for 72 hours, after which it should be removed.  Wrap patch in a tissue and discard in the trash. Wash hands thoroughly with soap and water. 2. You may remove the patch earlier than 72 hours if you experience unpleasant side effects which may include dry mouth, dizziness or visual disturbances. 3. Avoid touching the patch. Wash your hands with soap and water  after contact with the patch.

## 2021-01-22 NOTE — Interval H&P Note (Signed)
History and Physical Interval Note:  01/22/2021 6:28 AM  [name]  has presented today for surgery, with the diagnosis of left shoulder rotator cuff tear.  The various methods of treatment have been discussed with the patient and family. After consideration of risks, benefits and other options for treatment, the patient has consented to  Procedure(s) with comments: left shoulder arthroscopy debridement, mini open rotator cuff tear repair, biceps tenodesis (Left) - NEEDS RNFA PLEASE as a surgical intervention.  The patient's history has been reviewed, patient examined, no change in status, stable for surgery.  I have reviewed the patient's chart and labs.  Questions were answered to the patient's satisfaction.     Anderson Malta

## 2021-01-23 ENCOUNTER — Encounter (HOSPITAL_BASED_OUTPATIENT_CLINIC_OR_DEPARTMENT_OTHER): Payer: Self-pay | Admitting: Orthopedic Surgery

## 2021-01-23 NOTE — Op Note (Signed)
NAME: [name], [name] MEDICAL RECORD NO: [id number] ACCOUNT NO: [id number] DATE OF BIRTH: 1961-01-04 FACILITY: [facility] LOCATION: [facility] PHYSICIAN: Yetta Barre. Marlou Sa, MD  Operative Report   DATE OF PROCEDURE: 01/22/2021  PREOPERATIVE DIAGNOSIS:  Left shoulder superior labral tear and massive rotator cuff tear involving the infraspinatus, supraspinatus, and upper half of the subscap.  POSTOPERATIVE DIAGNOSIS:  Left shoulder superior labral tear and massive rotator cuff tear involving the infraspinatus, supraspinatus, and upper half of the subscap, early frozen shoulder.  PROCEDURES:  Left shoulder examination under anesthesia with arthroscopy, biceps tendon release, limited debridement of the superior labrum, and mini open rotator cuff tear repair of the upper subscap, infraspinatus, supraspinatus, as well as biceps  tenodesis.  SURGEON:  Tonna Corner dean, MD  ASSISTANT:  April Green, RNFA  INDICATIONS:  The patient is a 60 year old patient with left shoulder pain following a fall on the ice.  MRI scan shows significant rotator cuff pathology.  Presents now for operative management after explanation of risks and benefits.  DESCRIPTION OF PROCEDURE:  The patient was brought to the operating room where general anesthetic was induced.  Preoperative antibiotics were administered.  Timeout was called.  The patient was placed in the beach chair position with the head in neutral  position.  Left arm examined under anesthesia, and she was found to have some passive restriction of forward flexion to about 150.  Manipulated into full forward flexion to 180.  Isolated glenohumeral abduction 80 and manipulated into full isolated  glenohumeral abduction to 110.  External rotation approximately 65 degrees bilaterally.  Shoulder stability good to anterior and posterior stress.  Following examination under anesthesia and manipulation, the left shoulder was prescrubbed with alcohol  and Betadine, allowed to  air dry, prepped with DuraPrep solution, and draped in a sterile manner.  Ioban used to seal the operative field and cover the axilla.  A timeout was called.  Posterior portal created 2 cm medial and inferior to the  posterolateral margin of the acromion.  Diagnostic arthroscopy was performed.  Anterior portal created under direct visualization.  Glenohumeral joint was intact.  Anterior and inferior glenohumeral ligaments intact.  Rotator cuff tear present involving  all supraspinatus and most of the infraspinatus as well as the upper portion of the subscapularis.  Significant biceps tendinitis also present.  Anterior portal created under direct visualization.  Biceps tendon released.  Superior labrum was debrided.   Rotator interval also released.  Glenohumeral articular surfaces intact.  Following this, the instruments were removed.  Portals were closed using 3-0 nylon.  Charlie Pitter was then used to cover the entire operative field.  A 4 cm incision was made off the  anterolateral margin of the acromion.  Deltoid split, measured distance of 4 cm, and marked with a #1 Vicryl suture.  The bursa was removed.  The patient next underwent biceps tenodesis using two Arthrex 1.8 mm knotless SutureTaks.  This gave very secure  fixation under appropriate tension.  Next, the upper portion of the subscap was examined and was found to be torn for about 30% of its area.  The footprint was debrided and one 2.8 mm anchor was placed on the medial aspect of the footprint.  These four  suture tapes were then passed through the tendon using the Scorpion and was tied.  These ends would later be incorporated into SwiveLocks on the greater tuberosity.  Next, the tear was examined.  Footprint was prepared to a bleeding bony surface.  Six 0  Vicryl  stay sutures were placed in Mason-Allen fashion anterior to posterior, and the tendon was mobilized both superiorly and inferiorly.  Coracohumeral ligament was released.  Next, following  mobilization with the Vicryl sutures placed, 3 Arthrex  SutureTaks were placed 2.8 mm at the interface between the greater tuberosity and the humeral head articular surface.  These 12 suture tapes were then passed equidistant beginning posterior and moving anterior.  Next, the attention was placed on the  sutures and a good watertight repair was achievable.  At this time, the SwiveLock was placed centrally, and the Vicryl sutures were all placed within the central SwiveLock.  After this, before knowing that the suture tapes were tied to 6 knots, which  brought the repair back down nicely.  These limbs were crossed.  The central Vicryl sutures were placed in the one SwiveLock with the arm in abduction.  The other six suture tapes were then placed in the SwiveLock with the anterior four suture tapes from  the subscap repair incorporated into the anterior and posterior SwiveLock with the tapes.  This was all performed with the arm in abduction and gave a very stable construct.  It should be noted that acromioplasty was performed with a rasp.  Thorough  irrigation performed both before and after the rotator cuff repair.  Solid repair was achieved.  Thorough irrigation was performed, and the deltoid split was closed using #1 Vicryl suture followed by interrupted inverted 0 Vicryl suture, 2-0 Vicryl  suture, and 3-0 Monocryl.  Stay suture in the deltoid was removed.  Steri-Strips and impervious dressings applied.  April's assistance was required as a medical necessity for opening, closing, and tissue mobilization.   [facility] D: 01/22/2021 10:59:53 am T: 01/22/2021 12:25:00 pm  JOB: [id number]/ [id number]

## 2021-01-24 MED FILL — PRAMIPEXOLE 0.125 MG TABLET: 0.125 | 90 days supply | Qty: 90 | Fill #3

## 2021-01-27 DIAGNOSIS — S46012A Strain of muscle(s) and tendon(s) of the rotator cuff of left shoulder, initial encounter: Secondary | ICD-10-CM

## 2021-01-27 DIAGNOSIS — M7522 Bicipital tendinitis, left shoulder: Secondary | ICD-10-CM

## 2021-01-29 ENCOUNTER — Other Ambulatory Visit: Payer: Self-pay

## 2021-01-29 ENCOUNTER — Ambulatory Visit (INDEPENDENT_AMBULATORY_CARE_PROVIDER_SITE_OTHER): Payer: 59 | Admitting: Orthopedic Surgery

## 2021-01-29 DIAGNOSIS — S46012A Strain of muscle(s) and tendon(s) of the rotator cuff of left shoulder, initial encounter: Secondary | ICD-10-CM

## 2021-02-01 ENCOUNTER — Ambulatory Visit (INDEPENDENT_AMBULATORY_CARE_PROVIDER_SITE_OTHER): Payer: 59

## 2021-02-01 DIAGNOSIS — I63411 Cerebral infarction due to embolism of right middle cerebral artery: Secondary | ICD-10-CM

## 2021-02-03 ENCOUNTER — Other Ambulatory Visit: Payer: Self-pay | Admitting: Orthopedic Surgery

## 2021-02-04 ENCOUNTER — Encounter: Payer: Self-pay | Admitting: Orthopedic Surgery

## 2021-02-04 NOTE — Progress Notes (Unsigned)
Post-Op Visit Note   Patient: [name]           Date of Birth: March 17, 1961           MRN: [id number] Visit Date: 01/29/2021 PCP: Crist Infante, MD   Assessment & Plan:  Chief Complaint:  Chief Complaint  Patient presents with  . Left Shoulder - Routine Post Op   Visit Diagnoses:  1. Traumatic complete tear of left rotator cuff, initial encounter     Plan: Patient is a 60 year old female who presents s/p left shoulder rotator cuff repair on 01/22/2021.  She had tear of 100% of the supraspinatus, greater than 50% of the infraspinatus, and the upper portion of the subscapularis.  She reports her left shoulder is hurting worse than her right shoulder did.  She is up to 51 degrees on the CPM machine currently.  Reports pain is improving and she takes oxycodone and Robaxin for pain control mostly at night.  Denies any fevers, chills, night sweats.  She does have a rash throughout the left arm that she noticed about a day after the surgery.  This rash is mostly through the mid humerus and distal.  No erythema around the incisions or any drainage noted from the incisions.  Axillary nerve is intact with deltoid firing.  On exam she has 15 degrees external rotation, 70 degrees abduction, 90 degrees forward flexion.  Plan to continue using her sling and continue no lifting of the arm or any lifting of the arm under its own power.  Keep going with the CPM machine.  Recommend hydrocortisone cream for the rash which is likely allergic given a similar rash following her last procedure.  Likely related to the DuraPrep.  Follow-up in 2 weeks for clinical recheck.  Follow-Up Instructions: No follow-ups on file.   Orders:  No orders of the defined types were placed in this encounter.  No orders of the defined types were placed in this encounter.   Imaging: No results found.  PMFS History: Patient Active Problem List   Diagnosis Date Noted  . Traumatic complete tear of left rotator cuff   .  Biceps tendonitis on left   . Essential hypertension 06/23/2020  . Prediabetes 06/23/2020  . Family hx-stroke 06/23/2020  . Restless legs syndrome (RLS) 06/23/2020  . Embolic cerebral infarction [facility]) s/p tPA, source unknown  06/21/2020  . Obesity (BMI 30.0-34.9) 09/16/2012  . Hyperlipidemia LDL goal <70 09/06/2008  . FIBROMYALGIA 09/06/2008  . HEADACHE, CHRONIC 09/06/2008  . FIBROIDS, UTERUS 09/01/2008  . Hypothyroidism 09/01/2008  . GERD 09/01/2008   Past Medical History:  Diagnosis Date  . Abnormal Pap smear of cervix   . Anemia 2007   due to fibroids  . Duodenitis without hemorrhage   . Esophageal stricture   . Esophagitis   . Fibromyalgia   . GERD (gastroesophageal reflux disease)   . Hiatal hernia   . History of loop recorder 06/23/2020   had CVA 06-21-20  . Hyperlipidemia   . Hypertension   . Hypothyroidism   . Leiomyoma of uterus, unspecified    fibroids  . Pneumonia    aspiration pneumonia after balloon surgery for weight loss  . Stroke [facility]) 06/21/2020   Right MCA infarct-recieved TPA    Family History  Problem Relation Age of Onset  . Celiac disease Mother   . Osteoporosis Mother   . Heart disease Father   . Stroke Father   . Irritable bowel syndrome Sister   . Lung  cancer Maternal Grandmother   . Breast cancer Paternal Grandmother   . Heart disease Sister   . Melanoma Sister   . Colon cancer Neg Hx   . Esophageal cancer Neg Hx   . Stomach cancer Neg Hx   . Rectal cancer Neg Hx     Past Surgical History:  Procedure Laterality Date  . ABDOMINAL HYSTERECTOMY    . BICEPT TENODESIS Right 12/07/2019   Procedure: BICEPS TENODESIS;  Surgeon: Meredith Pel, MD;  Location: [place];  Service: Orthopedics;  Laterality: Right;  . BUBBLE STUDY  06/23/2020   Procedure: BUBBLE STUDY;  Surgeon: Donato Heinz, MD;  Location: [facility] ENDOSCOPY;  Service: Cardiovascular;;  . CARPAL TUNNEL RELEASE  2014   right wrist  . CHOLECYSTECTOMY     . HYSTEROSCOPY W/ ENDOMETRIAL ABLATION  4/05  . LOOP RECORDER INSERTION N/A 06/23/2020   Procedure: LOOP RECORDER INSERTION;  Surgeon: Constance Haw, MD;  Location: [place] CV LAB;  Service: Cardiovascular;  Laterality: N/A;  . RECTOCELE REPAIR  2007  . SHOULDER ARTHROSCOPY WITH OPEN ROTATOR CUFF REPAIR Right 12/07/2019   Procedure: RIGHT SHOULDER ARTHROSCOPY,   MINI OPEN SUBSCAPULARIS, AND SUPRASPINATUS REPAIR, OPEN BICEPS TENODESIS;  Surgeon: Meredith Pel, MD;  Location: [place];  Service: Orthopedics;  Laterality: Right;  . SHOULDER ARTHROSCOPY WITH ROTATOR CUFF REPAIR AND OPEN BICEPS TENODESIS Left 01/22/2021   Procedure: left shoulder arthroscopy debridement, mini open rotator cuff tear repair, biceps tenodesis;  Surgeon: Meredith Pel, MD;  Location: [place];  Service: Orthopedics;  Laterality: Left;  NEEDS RNFA PLEASE  . TEE WITHOUT CARDIOVERSION N/A 06/23/2020   Procedure: TRANSESOPHAGEAL ECHOCARDIOGRAM (TEE);  Surgeon: Donato Heinz, MD;  Location: [facility] ENDOSCOPY;  Service: Cardiovascular;  Laterality: N/A;  . TONSILLECTOMY     as child  . TOTAL VAGINAL HYSTERECTOMY  5/07  . TUBAL LIGATION  1991   BILATERAL   Social History   Occupational History  . Occupation: Nurse  Tobacco Use  . Smoking status: Former Smoker    Quit date: 12/23/2002    Years since quitting: 18.1  . Smokeless tobacco: Never Used  Vaping Use  . Vaping Use: Never used  Substance and Sexual Activity  . Alcohol use: Not Currently  . Drug use: No  . Sexual activity: Yes    Partners: Male    Birth control/protection: Surgical    Comment: [facility]

## 2021-02-05 ENCOUNTER — Other Ambulatory Visit: Payer: Self-pay | Admitting: Surgical

## 2021-02-05 MED ORDER — METHOCARBAMOL 500 MG PO TABS: 500.0000 mg | ORAL_TABLET | Freq: Three times a day (TID) | ORAL | 1 refills | Status: DC | PRN

## 2021-02-05 MED ORDER — OXYCODONE HCL 5 MG PO CAPS: 5.0000 mg | ORAL_CAPSULE | Freq: Three times a day (TID) | ORAL | 0 refills | Status: DC | PRN

## 2021-02-05 MED ORDER — OXYCODONE HCL 5 MG PO TABS: 5.0000 mg | ORAL_TABLET | Freq: Three times a day (TID) | ORAL | 0 refills | Status: DC | PRN

## 2021-02-05 NOTE — Telephone Encounter (Signed)
Sent in

## 2021-02-05 NOTE — Telephone Encounter (Signed)
Please advise 

## 2021-02-05 NOTE — Telephone Encounter (Signed)
Already adressed in separate msg

## 2021-02-06 LAB — CUP PACEART REMOTE DEVICE CHECK
Date Time Interrogation Session: 20220315000600
Implantable Pulse Generator Implant Date: 20210730

## 2021-02-06 NOTE — Telephone Encounter (Signed)
Can you refuse rx? It will not allow me to do so. Thanks.

## 2021-02-09 NOTE — Progress Notes (Signed)
Remote ICD transmission.   

## 2021-02-14 ENCOUNTER — Encounter: Payer: Self-pay | Admitting: Orthopedic Surgery

## 2021-02-14 ENCOUNTER — Ambulatory Visit (INDEPENDENT_AMBULATORY_CARE_PROVIDER_SITE_OTHER): Payer: 59 | Admitting: Orthopedic Surgery

## 2021-02-14 DIAGNOSIS — S46012A Strain of muscle(s) and tendon(s) of the rotator cuff of left shoulder, initial encounter: Secondary | ICD-10-CM

## 2021-02-14 NOTE — Progress Notes (Signed)
Post-Op Visit Note   Patient: [name]           Date of Birth: 1961/04/24           MRN: [id number] Visit Date: 02/14/2021 PCP: Crist Infante, MD   Assessment & Plan:  Chief Complaint:  Chief Complaint  Patient presents with  . Left Shoulder - Routine Post Op   Visit Diagnoses:  1. Traumatic complete tear of left rotator cuff, initial encounter     Plan: Patient is a 60 year old female who presents s/p left shoulder arthroscopy with debridement, mini open rotator cuff tear repair, biceps tenodesis on 01/22/2021.  She states that she is feeling better than her last visit.  At 60 degrees on CPM machine.  Localizes most of her pain to the mid humerus.  Denies any numbness/tingling.  She has not started physical therapy yet.  She takes oxycodone at night for pain control and is sleeping in her recliner.  On exam she has well-healed incision.  20 degrees external rotation, 80 degrees abduction, 90 degrees forward flexion.  Decent supraspinatus, infraspinatus, subscapularis strength on exam at this early point in her recovery.  Rash has significantly improved since her last visit.  Plan to refer patient to physical therapy upstairs to work on passive range of motion and some active assisted range of motion.  Okay for full active range of motion and to begin rotator cuff strengthening on 03/05/2021.  Follow-up in 4 weeks for clinical recheck with Dr. Marlou Sa.  Follow-Up Instructions: No follow-ups on file.   Orders:  No orders of the defined types were placed in this encounter.  No orders of the defined types were placed in this encounter.   Imaging: No results found.  PMFS History: Patient Active Problem List   Diagnosis Date Noted  . Traumatic complete tear of left rotator cuff   . Biceps tendonitis on left   . Essential hypertension 06/23/2020  . Prediabetes 06/23/2020  . Family hx-stroke 06/23/2020  . Restless legs syndrome (RLS) 06/23/2020  . Embolic cerebral infarction  [facility]) s/p tPA, source unknown  06/21/2020  . Obesity (BMI 30.0-34.9) 09/16/2012  . Hyperlipidemia LDL goal <70 09/06/2008  . FIBROMYALGIA 09/06/2008  . HEADACHE, CHRONIC 09/06/2008  . FIBROIDS, UTERUS 09/01/2008  . Hypothyroidism 09/01/2008  . GERD 09/01/2008   Past Medical History:  Diagnosis Date  . Abnormal Pap smear of cervix   . Anemia 2007   due to fibroids  . Duodenitis without hemorrhage   . Esophageal stricture   . Esophagitis   . Fibromyalgia   . GERD (gastroesophageal reflux disease)   . Hiatal hernia   . History of loop recorder 06/23/2020   had CVA 06-21-20  . Hyperlipidemia   . Hypertension   . Hypothyroidism   . Leiomyoma of uterus, unspecified    fibroids  . Pneumonia    aspiration pneumonia after balloon surgery for weight loss  . Stroke [facility]) 06/21/2020   Right MCA infarct-recieved TPA    Family History  Problem Relation Age of Onset  . Celiac disease Mother   . Osteoporosis Mother   . Heart disease Father   . Stroke Father   . Irritable bowel syndrome Sister   . Lung cancer Maternal Grandmother   . Breast cancer Paternal Grandmother   . Heart disease Sister   . Melanoma Sister   . Colon cancer Neg Hx   . Esophageal cancer Neg Hx   . Stomach cancer Neg Hx   . Rectal  cancer Neg Hx     Past Surgical History:  Procedure Laterality Date  . ABDOMINAL HYSTERECTOMY    . BICEPT TENODESIS Right 12/07/2019   Procedure: BICEPS TENODESIS;  Surgeon: Meredith Pel, MD;  Location: [place];  Service: Orthopedics;  Laterality: Right;  . BUBBLE STUDY  06/23/2020   Procedure: BUBBLE STUDY;  Surgeon: Donato Heinz, MD;  Location[facility] ENDOSCOPY;  Service: Cardiovascular;;  . CARPAL TUNNEL RELEASE  2014   right wrist  . CHOLECYSTECTOMY    . HYSTEROSCOPY W/ ENDOMETRIAL ABLATION  4/05  . LOOP RECORDER INSERTION N/A 06/23/2020   Procedure: LOOP RECORDER INSERTION;  Surgeon: Constance Haw, MD;  Location: [place] CV LAB;   Service: Cardiovascular;  Laterality: N/A;  . RECTOCELE REPAIR  2007  . SHOULDER ARTHROSCOPY WITH OPEN ROTATOR CUFF REPAIR Right 12/07/2019   Procedure: RIGHT SHOULDER ARTHROSCOPY,   MINI OPEN SUBSCAPULARIS, AND SUPRASPINATUS REPAIR, OPEN BICEPS TENODESIS;  Surgeon: Meredith Pel, MD;  Location: [place];  Service: Orthopedics;  Laterality: Right;  . SHOULDER ARTHROSCOPY WITH ROTATOR CUFF REPAIR AND OPEN BICEPS TENODESIS Left 01/22/2021   Procedure: left shoulder arthroscopy debridement, mini open rotator cuff tear repair, biceps tenodesis;  Surgeon: Meredith Pel, MD;  Location: [place];  Service: Orthopedics;  Laterality: Left;  NEEDS RNFA PLEASE  . TEE WITHOUT CARDIOVERSION N/A 06/23/2020   Procedure: TRANSESOPHAGEAL ECHOCARDIOGRAM (TEE);  Surgeon: Donato Heinz, MD;  Location: [facility] ENDOSCOPY;  Service: Cardiovascular;  Laterality: N/A;  . TONSILLECTOMY     as child  . TOTAL VAGINAL HYSTERECTOMY  5/07  . TUBAL LIGATION  1991   BILATERAL   Social History   Occupational History  . Occupation: Nurse  Tobacco Use  . Smoking status: Former Smoker    Quit date: 12/23/2002    Years since quitting: 18.1  . Smokeless tobacco: Never Used  Vaping Use  . Vaping Use: Never used  Substance and Sexual Activity  . Alcohol use: Not Currently  . Drug use: No  . Sexual activity: Yes    Partners: Male    Birth control/protection: Surgical    Comment: TV[facility]

## 2021-02-15 ENCOUNTER — Other Ambulatory Visit (HOSPITAL_COMMUNITY): Payer: Self-pay | Admitting: Internal Medicine

## 2021-02-15 MED FILL — OMEPRAZOLE 40 MG CPDR: 40 | 90 days supply | Qty: 180 | Fill #0

## 2021-02-15 MED FILL — LEVOTHYROXINE 75 MCG TABLET: 75 | 90 days supply | Qty: 90 | Fill #3

## 2021-02-20 ENCOUNTER — Telehealth: Payer: Self-pay | Admitting: Orthopedic Surgery

## 2021-02-20 NOTE — Telephone Encounter (Signed)
[place] for this? Does it need to be whole month of May?

## 2021-02-20 NOTE — Telephone Encounter (Signed)
Patient called, stated needs oow note to extend through May for [employer]. States that is what Lurena Joiner advised at her last ov. Callback [phone]

## 2021-02-22 NOTE — Telephone Encounter (Signed)
Yeah that's good

## 2021-02-22 NOTE — Telephone Encounter (Signed)
Note done and faxed to [facility]

## 2021-02-26 ENCOUNTER — Other Ambulatory Visit: Payer: Self-pay

## 2021-02-26 ENCOUNTER — Encounter: Payer: Self-pay | Admitting: Physical Therapy

## 2021-02-26 ENCOUNTER — Ambulatory Visit: Payer: 59 | Admitting: Physical Therapy

## 2021-02-26 DIAGNOSIS — M6281 Muscle weakness (generalized): Secondary | ICD-10-CM | POA: Diagnosis not present

## 2021-02-26 DIAGNOSIS — R293 Abnormal posture: Secondary | ICD-10-CM | POA: Diagnosis not present

## 2021-02-26 DIAGNOSIS — M25612 Stiffness of left shoulder, not elsewhere classified: Secondary | ICD-10-CM | POA: Diagnosis not present

## 2021-02-26 DIAGNOSIS — M25512 Pain in left shoulder: Secondary | ICD-10-CM | POA: Diagnosis not present

## 2021-02-26 NOTE — Therapy (Signed)
[facility] [address] [place], [place], [address] Phone: [phone]   Fax:  [phone]  Physical Therapy Evaluation  Patient Details  Name: [name] MRN: [id number] Date of Birth: Mar 30, 1961 Referring Provider (PT): Dr. Marlou Sa   Encounter Date: 02/26/2021   PT End of Session - 02/26/21 1106    Visit Number 1    Number of Visits 24    Date for PT Re-Evaluation 05/21/21    PT Start Time 5027    PT Stop Time 1050    PT Time Calculation (min) 35 min    Activity Tolerance Patient tolerated treatment well    Behavior During Therapy [facility] for tasks assessed/performed           Past Medical History:  Diagnosis Date  . Abnormal Pap smear of cervix   . Anemia 2007   due to fibroids  . Duodenitis without hemorrhage   . Esophageal stricture   . Esophagitis   . Fibromyalgia   . GERD (gastroesophageal reflux disease)   . Hiatal hernia   . History of loop recorder 06/23/2020   had CVA 06-21-20  . Hyperlipidemia   . Hypertension   . Hypothyroidism   . Leiomyoma of uterus, unspecified    fibroids  . Pneumonia    aspiration pneumonia after balloon surgery for weight loss  . Stroke ([place]) 06/21/2020   Right MCA infarct-recieved TPA    Past Surgical History:  Procedure Laterality Date  . ABDOMINAL HYSTERECTOMY    . BICEPT TENODESIS Right 12/07/2019   Procedure: BICEPS TENODESIS;  Surgeon: Meredith Pel, MD;  Location: [place];  Service: Orthopedics;  Laterality: Right;  . BUBBLE STUDY  06/23/2020   Procedure: BUBBLE STUDY;  Surgeon: Donato Heinz, MD;  Location: [facility] ENDOSCOPY;  Service: Cardiovascular;;  . CARPAL TUNNEL RELEASE  2014   right wrist  . CHOLECYSTECTOMY    . HYSTEROSCOPY W/ ENDOMETRIAL ABLATION  4/05  . LOOP RECORDER INSERTION N/A 06/23/2020   Procedure: LOOP RECORDER INSERTION;  Surgeon: Constance Haw, MD;  Location: [place] CV LAB;  Service: Cardiovascular;  Laterality: N/A;   . RECTOCELE REPAIR  2007  . SHOULDER ARTHROSCOPY WITH OPEN ROTATOR CUFF REPAIR Right 12/07/2019   Procedure: RIGHT SHOULDER ARTHROSCOPY,   MINI OPEN SUBSCAPULARIS, AND SUPRASPINATUS REPAIR, OPEN BICEPS TENODESIS;  Surgeon: Meredith Pel, MD;  Location: [place];  Service: Orthopedics;  Laterality: Right;  . SHOULDER ARTHROSCOPY WITH ROTATOR CUFF REPAIR AND OPEN BICEPS TENODESIS Left 01/22/2021   Procedure: left shoulder arthroscopy debridement, mini open rotator cuff tear repair, biceps tenodesis;  Surgeon: Meredith Pel, MD;  Location: [place];  Service: Orthopedics;  Laterality: Left;  NEEDS RNFA PLEASE  . TEE WITHOUT CARDIOVERSION N/A 06/23/2020   Procedure: TRANSESOPHAGEAL ECHOCARDIOGRAM (TEE);  Surgeon: Donato Heinz, MD;  Location: [facility] ENDOSCOPY;  Service: Cardiovascular;  Laterality: N/A;  . TONSILLECTOMY     as child  . TOTAL VAGINAL HYSTERECTOMY  5/07  . TUBAL LIGATION  1991   BILATERAL    There were no vitals filed for this visit.    Subjective Assessment - 02/26/21 1019    Subjective Pt is a 60 y/o female who presents to OPPT s/p Lt mini open rotator cuff repair with biceps tenodesis on 01/22/21.  She had Rt shoulder repaired in 2021 with PT at this facility.    Limitations Lifting    Patient Stated Goals regain use of LUE    Currently  in Pain? Yes    Pain Score 5    up to 7/10; at best 1-2/10   Pain Location Shoulder    Pain Orientation Left    Pain Descriptors / Indicators Nagging;Throbbing    Pain Type Acute pain;Surgical pain    Pain Radiating Towards wrist    Pain Onset More than a month ago    Pain Frequency Intermittent    Aggravating Factors  worse at night (sleeping), reaching up    Pain Relieving Factors topical creams    Effect of Pain on Daily Activities sleeping in recliner              [facility] PT Assessment - 02/26/21 1023      Assessment   Medical Diagnosis Lt Mini open rotator cuff biceps  tenodesis    Referring Provider (PT) Dr. Marlou Sa    Onset Date/Surgical Date 01/22/21    Hand Dominance Right    Next MD Visit 03/14/21    Prior Therapy at this clinic last year for Rt shoulder      Precautions   Precautions Shoulder    Precaution Comments PROM and AAROM until 4/11      Restrictions   Weight Bearing Restrictions No      Balance Screen   Has the patient fallen in the past 6 months Yes    How many times? 1    Has the patient had a decrease in activity level because of a fear of falling?  Yes    Is the patient reluctant to leave their home because of a fear of falling?  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)      Cognition   Overall Cognitive Status Within Functional Limits for tasks assessed      Observation/Other Assessments   Focus on Therapeutic Outcomes (FOTO)  52 (predicted 68)      Posture/Postural Control   Posture/Postural Control Postural limitations    Postural Limitations Rounded Shoulders;Forward head      ROM / Strength   AROM / PROM / Strength PROM;Strength      PROM   PROM Assessment Site Shoulder    Right/Left Shoulder Left    Left Shoulder Flexion 105 Degrees    Left Shoulder ABduction 87 Degrees    Left Shoulder Internal Rotation 60 Degrees   30 deg abdct - max range before hitting waist   Left Shoulder External Rotation 52 Degrees   30 deg abdct     Strength   Overall Strength Comments deferred at this time                      Objective measurements completed on examination: See above findings.       [facility] Adult PT Treatment/Exercise - 02/26/21 1023      Exercises   Exercises Other Exercises    Other Exercises  see pt  instructions; performed 5-10 reps of each exercise                  PT Education - 02/26/21 1105    Education Details HEP    Person(s) Educated Patient    Methods  Explanation;Demonstration;Handout    Comprehension Verbalized understanding;Returned demonstration;Need further instruction            PT Short Term Goals - 02/26/21 1112      PT SHORT TERM GOAL #1   Title Patient will demonstrate independent use of home exercise program to maintain progress from in clinic treatments.    Time 4    Period Weeks    Status New    Target Date 03/26/21             PT Long Term Goals - 02/26/21 1112      PT LONG TERM GOAL #1   Title Patient will demonstrate/report pain at worst less than or equal to 2/10 to facilitate minimal limitation in daily activity secondary to pain symptoms.    Time 12    Period Weeks    Status New    Target Date 05/21/21      PT LONG TERM GOAL #2   Title Patient will demonstrate independent use of home exercise program to facilitate ability to maintain/progress functional gains from skilled physical therapy services.    Time 12    Status New    Target Date 05/21/21      PT LONG TERM GOAL #3   Title Patient will demonstrate return to work/recreational activity at previous level of function without limitations secondary due to condition    Time 12    Period Weeks    Status On-going    Target Date 05/21/21      PT LONG TERM GOAL #4   Title Pt. will demonstrate Lt GH joint AROM WFL s symptoms to facilitate usual dressing, self care, reaching at PLOF.    Time 12    Period Weeks    Status New    Target Date 05/21/21      PT LONG TERM GOAL #5   Title Pt. will demonstrate RUE MMT 5/5 throughout to facilitate usual lifting, carrying in household and work activity at [employer].    Time 12    Period Weeks    Status New    Target Date 05/21/21      Additional Long Term Goals   Additional Long Term Goals Yes      PT LONG TERM GOAL #6   Title FOTO  score improved to 68 for improved function    Time 12    Period Weeks    Status New    Target Date 05/21/21                  Plan - 02/26/21 1106    Clinical Impression Statement Pt is a 60 y/o female who presents to OPPT s/p Lt mini open RTC repair with biceps tenodesis on 01/22/21.  Pt with extensive repair and demonstrates decreased ROM and strength as well as postural abnormalties affecting functional mobility.  She will benefit from PT to address deficits listed.    Personal Factors and Comorbidities Comorbidity 3+    Comorbidities Rt RTC repair, fibromyalgia, HTN, HLD, CVA 05/2020 s/p tPA    Examination-Activity Limitations Bathing;Bed Mobility;Sleep;Carry;Lift;Hygiene/Grooming;Dressing;Reach Overhead    Examination-Participation Restrictions Cleaning;Community Activity;Driving;Laundry;Meal Prep;Occupation    Stability/Clinical Decision Making Evolving/Moderate complexity    Clinical Decision Making Moderate    Rehab Potential Good    PT Frequency 2x / week    PT Duration 12 weeks    PT Treatment/Interventions ADLs/Self Care Home Management;Cryotherapy;Electrical Stimulation;Moist Heat;Therapeutic exercise;Therapeutic activities;Ultrasound;Neuromuscular re-education;Patient/family education;Manual techniques;Vasopneumatic Device;Taping;Dry needling;Passive range of motion    PT Next Visit  Plan review HEP and manual for ROM, BFR (?hx of CVA), scapular retraction, vaso PRN for pain    PT Home Exercise Plan Access Code: [email or web address]    Consulted and Agree with Plan of Care Patient           Patient will benefit from skilled therapeutic intervention in order to improve the following deficits and impairments:  Increased edema,Pain,Decreased strength,Decreased range of motion,Impaired flexibility,Postural dysfunction,Impaired UE functional use  Visit Diagnosis: Acute pain of left shoulder - Plan: PT plan of care cert/re-cert  Stiffness of left shoulder, not elsewhere classified -  Plan: PT plan of care cert/re-cert  Abnormal posture - Plan: PT plan of care cert/re-cert  Muscle weakness (generalized) - Plan: PT plan of care cert/re-cert     Problem List Patient Active Problem List   Diagnosis Date Noted  . Traumatic complete tear of left rotator cuff   . Biceps tendonitis on left   . Essential hypertension 06/23/2020  . Prediabetes 06/23/2020  . Family hx-stroke 06/23/2020  . Restless legs syndrome (RLS) 06/23/2020  . Embolic cerebral infarction [facility]) s/p tPA, source unknown  06/21/2020  . Obesity (BMI 30.0-34.9) 09/16/2012  . Hyperlipidemia LDL goal <70 09/06/2008  . FIBROMYALGIA 09/06/2008  . HEADACHE, CHRONIC 09/06/2008  . FIBROIDS, UTERUS 09/01/2008  . Hypothyroidism 09/01/2008  . GERD 09/01/2008      Laureen Abrahams, PT, DPT 02/26/21 11:16 AM    [facility] [address] [place], [place], [address] Phone: [phone]   Fax:  [phone]  Name: [name] MRN: [id number] Date of Birth: Sep 03, 1961

## 2021-02-26 NOTE — Patient Instructions (Signed)
Access Code: [email or web address] URL: [email or web address] Date: 02/26/2021 Prepared by: Faustino Congress  Exercises Supine Shoulder Flexion Extension AAROM with Dowel - 3-5 x daily - 7 x weekly - 1-3 sets - 10 reps - 3-5 hold Supine Shoulder Abduction AAROM with Dowel - 3-5 x daily - 7 x weekly - 1-3 sets - 10 reps - 3-5 sec hold Supine Shoulder External Rotation AAROM with Dowel - 3-5 x daily - 7 x weekly - 1-3 sets - 10 reps - 3-5 sec hold

## 2021-02-27 ENCOUNTER — Other Ambulatory Visit (HOSPITAL_COMMUNITY): Payer: Self-pay

## 2021-02-27 MED FILL — Losartan Potassium Tab 100 MG: ORAL | 30 days supply | Qty: 30 | Fill #0 | Status: AC

## 2021-02-28 ENCOUNTER — Other Ambulatory Visit: Payer: Self-pay

## 2021-02-28 ENCOUNTER — Encounter: Payer: Self-pay | Admitting: Physical Therapy

## 2021-02-28 ENCOUNTER — Ambulatory Visit: Payer: 59 | Admitting: Physical Therapy

## 2021-02-28 ENCOUNTER — Other Ambulatory Visit (HOSPITAL_COMMUNITY): Payer: Self-pay

## 2021-02-28 DIAGNOSIS — M25512 Pain in left shoulder: Secondary | ICD-10-CM | POA: Diagnosis not present

## 2021-02-28 DIAGNOSIS — M25612 Stiffness of left shoulder, not elsewhere classified: Secondary | ICD-10-CM

## 2021-02-28 DIAGNOSIS — R293 Abnormal posture: Secondary | ICD-10-CM | POA: Diagnosis not present

## 2021-02-28 DIAGNOSIS — M6281 Muscle weakness (generalized): Secondary | ICD-10-CM

## 2021-02-28 DIAGNOSIS — R600 Localized edema: Secondary | ICD-10-CM

## 2021-02-28 NOTE — Therapy (Signed)
[facility] [address] [place], [place], [address] Phone: [phone]   Fax:  [phone]  Physical Therapy Treatment  Patient Details  Name: [name] MRN: [id number] Date of Birth: February 06, 1961 Referring Provider (PT): Dr. Marlou Sa   Encounter Date: 02/28/2021   PT End of Session - 02/28/21 1159    Visit Number 2    Number of Visits 24    Date for PT Re-Evaluation 05/21/21    PT Start Time 1058    PT Stop Time 1148    PT Time Calculation (min) 50 min    Activity Tolerance Patient tolerated treatment well    Behavior During Therapy [facility] for tasks assessed/performed           Past Medical History:  Diagnosis Date  . Abnormal Pap smear of cervix   . Anemia 2007   due to fibroids  . Duodenitis without hemorrhage   . Esophageal stricture   . Esophagitis   . Fibromyalgia   . GERD (gastroesophageal reflux disease)   . Hiatal hernia   . History of loop recorder 06/23/2020   had CVA 06-21-20  . Hyperlipidemia   . Hypertension   . Hypothyroidism   . Leiomyoma of uterus, unspecified    fibroids  . Pneumonia    aspiration pneumonia after balloon surgery for weight loss  . Stroke ([place]) 06/21/2020   Right MCA infarct-recieved TPA    Past Surgical History:  Procedure Laterality Date  . ABDOMINAL HYSTERECTOMY    . BICEPT TENODESIS Right 12/07/2019   Procedure: BICEPS TENODESIS;  Surgeon: Meredith Pel, MD;  Location: [place];  Service: Orthopedics;  Laterality: Right;  . BUBBLE STUDY  06/23/2020   Procedure: BUBBLE STUDY;  Surgeon: Donato Heinz, MD;  Location: [facility] ENDOSCOPY;  Service: Cardiovascular;;  . CARPAL TUNNEL RELEASE  2014   right wrist  . CHOLECYSTECTOMY    . HYSTEROSCOPY W/ ENDOMETRIAL ABLATION  4/05  . LOOP RECORDER INSERTION N/A 06/23/2020   Procedure: LOOP RECORDER INSERTION;  Surgeon: Constance Haw, MD;  Location: [place] CV LAB;  Service: Cardiovascular;  Laterality: N/A;   . RECTOCELE REPAIR  2007  . SHOULDER ARTHROSCOPY WITH OPEN ROTATOR CUFF REPAIR Right 12/07/2019   Procedure: RIGHT SHOULDER ARTHROSCOPY,   MINI OPEN SUBSCAPULARIS, AND SUPRASPINATUS REPAIR, OPEN BICEPS TENODESIS;  Surgeon: Meredith Pel, MD;  Location: [place];  Service: Orthopedics;  Laterality: Right;  . SHOULDER ARTHROSCOPY WITH ROTATOR CUFF REPAIR AND OPEN BICEPS TENODESIS Left 01/22/2021   Procedure: left shoulder arthroscopy debridement, mini open rotator cuff tear repair, biceps tenodesis;  Surgeon: Meredith Pel, MD;  Location: [place];  Service: Orthopedics;  Laterality: Left;  NEEDS RNFA PLEASE  . TEE WITHOUT CARDIOVERSION N/A 06/23/2020   Procedure: TRANSESOPHAGEAL ECHOCARDIOGRAM (TEE);  Surgeon: Donato Heinz, MD;  Location: [facility] ENDOSCOPY;  Service: Cardiovascular;  Laterality: N/A;  . TONSILLECTOMY     as child  . TOTAL VAGINAL HYSTERECTOMY  5/07  . TUBAL LIGATION  1991   BILATERAL    There were no vitals filed for this visit.   Subjective Assessment - 02/28/21 1059    Subjective shoulder is "hurting today."    Limitations Lifting    Patient Stated Goals regain use of LUE    Pain Score 6     Pain Location Shoulder    Pain Orientation Left    Pain Descriptors / Indicators Nagging;Throbbing    Pain Type Acute pain;Surgical pain  Pain Onset More than a month ago    Pain Frequency Intermittent    Aggravating Factors  worse at night (sleeping), reaching up    Pain Relieving Factors topical creams              [facility] PT Assessment - 02/28/21 0001      Assessment   Medical Diagnosis Lt Mini open rotator cuff biceps tenodesis    Referring Provider (PT) Dr. 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)      Cognition   Overall Cognitive Status Within Functional Limits for tasks assessed      Observation/Other Assessments   Focus on Therapeutic Outcomes (FOTO)  52 (predicted 68)      Posture/Postural Control   Posture/Postural Control Postural limitations    Postural Limitations Rounded Shoulders;Forward head      PROM   Left Shoulder Flexion 105 Degrees    Left Shoulder ABduction 87 Degrees    Left Shoulder Internal Rotation 60 Degrees   30 deg abdct - max range before hitting waist   Left Shoulder External Rotation 52 Degrees   30 deg abdct     Strength   Overall Strength Comments deferred at this time      Palpation   Palpation comment post op pain and swelling noted Lt shoulder                         [facility] Adult PT Treatment/Exercise - 02/28/21 1103      Exercises   Exercises Shoulder      Shoulder Exercises: Supine   External Rotation AAROM;Left;20 reps   1# bar   Flexion AAROM;20 reps   1# bar   ABduction AAROM;Left;20 reps   #1 bar     Shoulder Exercises: Pulleys   Flexion 3 minutes    Flexion Limitations 5 sec holds    Scaption 3 minutes    Scaption Limitations 5 sec holds      Modalities   Modalities Vasopneumatic      Vasopneumatic   Number Minutes Vasopneumatic  10 minutes    Vasopnuematic Location  Shoulder    Vasopneumatic Pressure Low    Vasopneumatic Temperature  34      Manual Therapy   Manual Therapy Passive ROM    Passive ROM Lt shoulder flexion/abduction/ir/er in supine to tolerance                    PT Short Term Goals - 02/28/21 1159      PT SHORT  TERM GOAL #1   Title Patient will demonstrate independent use of home exercise program to maintain progress from in clinic treatments.    Time 4    Period Weeks    Status  On-going    Target Date 03/26/21             PT Long Term Goals - 02/26/21 1112      PT LONG TERM GOAL #1   Title Patient will demonstrate/report pain at worst less than or equal to 2/10 to facilitate minimal limitation in daily activity secondary to pain symptoms.    Time 12    Period Weeks    Status New    Target Date 05/21/21      PT LONG TERM GOAL #2   Title Patient will demonstrate independent use of home exercise program to facilitate ability to maintain/progress functional gains from skilled physical therapy services.    Time 12    Status New    Target Date 05/21/21      PT LONG TERM GOAL #3   Title Patient will demonstrate return to work/recreational activity at previous level of function without limitations secondary due to condition    Time 12    Period Weeks    Status On-going    Target Date 05/21/21      PT LONG TERM GOAL #4   Title Pt. will demonstrate Lt GH joint AROM WFL s symptoms to facilitate usual dressing, self care, reaching at PLOF.    Time 12    Period Weeks    Status New    Target Date 05/21/21      PT LONG TERM GOAL #5   Title Pt. will demonstrate RUE MMT 5/5 throughout to facilitate usual lifting, carrying in household and work activity at [employer].    Time 12    Period Weeks    Status New    Target Date 05/21/21      Additional Long Term Goals   Additional Long Term Goals Yes      PT LONG TERM GOAL #6   Title FOTO score improved to 68 for improved function    Time 12    Period Weeks    Status New    Target Date 05/21/21                 Plan - 02/28/21 1200    Clinical Impression Statement Pt tolerated session well today with continued focus on passive and AAROM exercises.  Able to begin active ROM next week as tolerated but continues to have elevated pain  with ROM exercises.  Will continue to benefit from PT to maximize function.    Personal Factors and Comorbidities Comorbidity 3+    Comorbidities Rt RTC repair, fibromyalgia, HTN, HLD, CVA 05/2020 s/p tPA    Examination-Activity Limitations Bathing;Bed Mobility;Sleep;Carry;Lift;Hygiene/Grooming;Dressing;Reach Overhead    Examination-Participation Restrictions Cleaning;Community Activity;Driving;Laundry;Meal Prep;Occupation    Stability/Clinical Decision Making Evolving/Moderate complexity    Rehab Potential Good    PT Frequency 2x / week    PT Duration 12 weeks    PT Treatment/Interventions ADLs/Self Care Home Management;Cryotherapy;Electrical Stimulation;Moist Heat;Therapeutic exercise;Therapeutic activities;Ultrasound;Neuromuscular re-education;Patient/family education;Manual techniques;Vasopneumatic Device;Taping;Dry needling;Passive range of motion    PT Next Visit Plan review HEP and manual for ROM, BFR (?hx of CVA), scapular retraction, vaso PRN for pain    PT Home Exercise Plan Access Code: [email or web address]    Consulted and Agree with Plan of Care Patient           Patient will benefit from skilled therapeutic intervention in order to improve the following deficits and impairments:  Increased edema,Pain,Decreased strength,Decreased range of motion,Impaired flexibility,Postural dysfunction,Impaired UE functional use  Visit Diagnosis: Acute pain of left shoulder - Plan: PT  plan of care cert/re-cert  Stiffness of left shoulder, not elsewhere classified - Plan: PT plan of care cert/re-cert  Abnormal posture - Plan: PT plan of care cert/re-cert  Muscle weakness (generalized) - Plan: PT plan of care cert/re-cert  Localized edema - Plan: PT plan of care cert/re-cert     Problem List Patient Active Problem List   Diagnosis Date Noted  . Traumatic complete tear of left rotator cuff   . Biceps tendonitis on left   . Essential hypertension 06/23/2020  . Prediabetes 06/23/2020  . Family  hx-stroke 06/23/2020  . Restless legs syndrome (RLS) 06/23/2020  . Embolic cerebral infarction [facility]) s/p tPA, source unknown  06/21/2020  . Obesity (BMI 30.0-34.9) 09/16/2012  . Hyperlipidemia LDL goal <70 09/06/2008  . FIBROMYALGIA 09/06/2008  . HEADACHE, CHRONIC 09/06/2008  . FIBROIDS, UTERUS 09/01/2008  . Hypothyroidism 09/01/2008  . GERD 09/01/2008      Laureen Abrahams, PT, DPT 02/28/21 12:04 PM    [facility] [address] [place], [place], [address] Phone: [phone]   Fax:  [phone]  Name: [name] MRN: [id number] Date of Birth: [birth year]

## 2021-03-05 ENCOUNTER — Ambulatory Visit: Payer: 59 | Admitting: Physical Therapy

## 2021-03-05 ENCOUNTER — Encounter: Payer: Self-pay | Admitting: Physical Therapy

## 2021-03-05 ENCOUNTER — Other Ambulatory Visit: Payer: Self-pay

## 2021-03-05 ENCOUNTER — Ambulatory Visit (INDEPENDENT_AMBULATORY_CARE_PROVIDER_SITE_OTHER): Payer: 59

## 2021-03-05 DIAGNOSIS — M25512 Pain in left shoulder: Secondary | ICD-10-CM | POA: Diagnosis not present

## 2021-03-05 DIAGNOSIS — R293 Abnormal posture: Secondary | ICD-10-CM

## 2021-03-05 DIAGNOSIS — I495 Sick sinus syndrome: Secondary | ICD-10-CM | POA: Diagnosis not present

## 2021-03-05 DIAGNOSIS — R600 Localized edema: Secondary | ICD-10-CM | POA: Diagnosis not present

## 2021-03-05 DIAGNOSIS — M6281 Muscle weakness (generalized): Secondary | ICD-10-CM

## 2021-03-05 DIAGNOSIS — M25612 Stiffness of left shoulder, not elsewhere classified: Secondary | ICD-10-CM | POA: Diagnosis not present

## 2021-03-05 NOTE — Patient Instructions (Signed)
Access Code: [email or web address] URL: [email or web address] Date: 03/05/2021 Prepared by: Faustino Congress  Exercises Supine Shoulder Flexion Extension AAROM with Dowel - 3-5 x daily - 7 x weekly - 1-3 sets - 10 reps - 3-5 hold Supine Shoulder Abduction AAROM with Dowel - 3-5 x daily - 7 x weekly - 1-3 sets - 10 reps - 3-5 sec hold Supine Shoulder External Rotation AAROM with Dowel - 3-5 x daily - 7 x weekly - 1-3 sets - 10 reps - 3-5 sec hold Supine Shoulder Flexion Extension Full Range AROM - 1-2 x daily - 7 x weekly - 2 sets - 10 reps Sidelying Shoulder Abduction Palm Forward - 1-2 x daily - 7 x weekly - 2 sets - 10 reps Sidelying Shoulder External Rotation AROM - 1-2 x daily - 7 x weekly - 2 sets - 10 reps

## 2021-03-05 NOTE — Therapy (Signed)
[facility] Physical Therapy [address] [place], [place], [address] Phone: [phone]   Fax:  [phone]  Physical Therapy Treatment  Patient Details  Name: [name] MRN: [id number] Date of Birth: May 14, 1961 Referring Provider (PT): Dr. Marlou Sa   Encounter Date: 03/05/2021   PT End of Session - 03/05/21 1554    Visit Number 3    Number of Visits 24    Date for PT Re-Evaluation 05/21/21    Authorization - Number of Visits 25    PT Start Time 1513    PT Stop Time 1603    PT Time Calculation (min) 50 min    Activity Tolerance Patient tolerated treatment well    Behavior During Therapy [facility] for tasks assessed/performed           Past Medical History:  Diagnosis Date  . Abnormal Pap smear of cervix   . Anemia 2007   due to fibroids  . Duodenitis without hemorrhage   . Esophageal stricture   . Esophagitis   . Fibromyalgia   . GERD (gastroesophageal reflux disease)   . Hiatal hernia   . History of loop recorder 06/23/2020   had CVA 06-21-20  . Hyperlipidemia   . Hypertension   . Hypothyroidism   . Leiomyoma of uterus, unspecified    fibroids  . Pneumonia    aspiration pneumonia after balloon surgery for weight loss  . Stroke ([place]) 06/21/2020   Right MCA infarct-recieved TPA    Past Surgical History:  Procedure Laterality Date  . ABDOMINAL HYSTERECTOMY    . BICEPT TENODESIS Right 12/07/2019   Procedure: BICEPS TENODESIS;  Surgeon: Meredith Pel, MD;  Location: [place];  Service: Orthopedics;  Laterality: Right;  . BUBBLE STUDY  06/23/2020   Procedure: BUBBLE STUDY;  Surgeon: Donato Heinz, MD;  Location: [facility] ENDOSCOPY;  Service: Cardiovascular;;  . CARPAL TUNNEL RELEASE  2014   right wrist  . CHOLECYSTECTOMY    . HYSTEROSCOPY W/ ENDOMETRIAL ABLATION  4/05  . LOOP RECORDER INSERTION N/A 06/23/2020   Procedure: LOOP RECORDER INSERTION;  Surgeon: Constance Haw, MD;  Location: [place] CV LAB;   Service: Cardiovascular;  Laterality: N/A;  . RECTOCELE REPAIR  2007  . SHOULDER ARTHROSCOPY WITH OPEN ROTATOR CUFF REPAIR Right 12/07/2019   Procedure: RIGHT SHOULDER ARTHROSCOPY,   MINI OPEN SUBSCAPULARIS, AND SUPRASPINATUS REPAIR, OPEN BICEPS TENODESIS;  Surgeon: Meredith Pel, MD;  Location: [place];  Service: Orthopedics;  Laterality: Right;  . SHOULDER ARTHROSCOPY WITH ROTATOR CUFF REPAIR AND OPEN BICEPS TENODESIS Left 01/22/2021   Procedure: left shoulder arthroscopy debridement, mini open rotator cuff tear repair, biceps tenodesis;  Surgeon: Meredith Pel, MD;  Location: [place];  Service: Orthopedics;  Laterality: Left;  NEEDS RNFA PLEASE  . TEE WITHOUT CARDIOVERSION N/A 06/23/2020   Procedure: TRANSESOPHAGEAL ECHOCARDIOGRAM (TEE);  Surgeon: Donato Heinz, MD;  Location: [facility] ENDOSCOPY;  Service: Cardiovascular;  Laterality: N/A;  . TONSILLECTOMY     as child  . TOTAL VAGINAL HYSTERECTOMY  5/07  . TUBAL LIGATION  1991   BILATERAL    There were no vitals filed for this visit.   Subjective Assessment - 03/05/21 1515    Subjective slowly seeing improvement, sleeping in bed x 2 nights    Limitations Lifting    Patient Stated Goals regain use of LUE    Currently in Pain? Yes    Pain Score 2     Pain Location Shoulder  Pain Orientation Left    Pain Descriptors / Indicators Nagging;Throbbing    Pain Type Acute pain;Surgical pain    Pain Onset More than a month ago    Pain Frequency Intermittent    Aggravating Factors  sleeping, reaching up    Pain Relieving Factors topical creams              [facility] PT Assessment - 03/05/21 1517      Assessment   Medical Diagnosis Lt Mini open rotator cuff biceps tenodesis    Referring Provider (PT) Dr. Marlou Sa    Onset Date/Surgical Date 01/22/21    Hand Dominance Right    Next MD Visit 03/14/21      ROM / Strength   AROM / PROM / Strength AROM      AROM   AROM Assessment Site  Shoulder    Right/Left Shoulder Left    Left Shoulder Flexion 125 Degrees    Left Shoulder ABduction 70 Degrees    Left Shoulder Internal Rotation 85 Degrees   45 deg abdct supine   Left Shoulder External Rotation 62 Degrees   40 deg abdct; supine                        [facility] Adult PT Treatment/Exercise - 03/05/21 1520      Shoulder Exercises: Supine   Flexion Left;AROM;20 reps      Shoulder Exercises: Sidelying   External Rotation Left;20 reps    ABduction Left;20 reps      Shoulder Exercises: Standing   Row Both;20 reps;Theraband    Theraband Level (Shoulder Row) Level 3 (Green)    Other Standing Exercises ER/IR walkout 2x10 reps on Lt with L1 band      Shoulder Exercises: Pulleys   Flexion 3 minutes    Flexion Limitations 5 sec holds    Scaption 3 minutes    Scaption Limitations 5 sec holds      Vasopneumatic   Number Minutes Vasopneumatic  10 minutes    Vasopnuematic Location  Shoulder    Vasopneumatic Pressure Low    Vasopneumatic Temperature  34                  PT Education - 03/05/21 1533    Education Details HEP    Person(s) Educated Patient    Methods Explanation;Demonstration;Handout    Comprehension Verbalized understanding;Returned demonstration            PT Short Term Goals - 02/28/21 1159      PT SHORT TERM GOAL #1   Title Patient will demonstrate independent use of home exercise program to maintain progress from in clinic treatments.    Time 4    Period Weeks    Status On-going    Target Date 03/26/21             PT Long Term Goals - 02/26/21 1112      PT LONG TERM GOAL #1   Title Patient will demonstrate/report pain at worst less than or equal to 2/10 to facilitate minimal limitation in daily activity secondary to pain symptoms.    Time 12    Period Weeks    Status New    Target Date 05/21/21      PT LONG TERM GOAL #2   Title Patient will demonstrate independent use of home exercise program to facilitate  ability to maintain/progress functional gains from skilled physical therapy services.    Time 12    Status  New    Target Date 05/21/21      PT LONG TERM GOAL #3   Title Patient will demonstrate return to work/recreational activity at previous level of function without limitations secondary due to condition    Time 12    Period Weeks    Status On-going    Target Date 05/21/21      PT LONG TERM GOAL #4   Title Pt. will demonstrate Lt GH joint AROM WFL s symptoms to facilitate usual dressing, self care, reaching at PLOF.    Time 12    Period Weeks    Status New    Target Date 05/21/21      PT LONG TERM GOAL #5   Title Pt. will demonstrate RUE MMT 5/5 throughout to facilitate usual lifting, carrying in household and work activity at [employer].    Time 12    Period Weeks    Status New    Target Date 05/21/21      Additional Long Term Goals   Additional Long Term Goals Yes      PT LONG TERM GOAL #6   Title FOTO score improved to 68 for improved function    Time 12    Period Weeks    Status New    Target Date 05/21/21                 Plan - 03/05/21 1555    Clinical Impression Statement Pt tolerated session well today with initiation of AROM and RTC strengthening exercises.  Will continue to benefit from PT to maximize function.  Added AROM exercises to HEP today.    Personal Factors and Comorbidities Comorbidity 3+    Comorbidities Rt RTC repair, fibromyalgia, HTN, HLD, CVA 05/2020 s/p tPA    Examination-Activity Limitations Bathing;Bed Mobility;Sleep;Carry;Lift;Hygiene/Grooming;Dressing;Reach Overhead    Examination-Participation Restrictions Cleaning;Community Activity;Driving;Laundry;Meal Prep;Occupation    Stability/Clinical Decision Making Evolving/Moderate complexity    Rehab Potential Good    PT Frequency 2x / week    PT Duration 12 weeks    PT Treatment/Interventions ADLs/Self Care Home Management;Cryotherapy;Electrical Stimulation;Moist Heat;Therapeutic  exercise;Therapeutic activities;Ultrasound;Neuromuscular re-education;Patient/family education;Manual techniques;Vasopneumatic Device;Taping;Dry needling;Passive range of motion    PT Next Visit Plan see how AROM exercises are going, vaso PRN for pain, manual for modalities, AROM/RTC strengthening, ?BFR with hx of CVA    PT Home Exercise Plan Access Code: [email or web address]    Consulted and Agree with Plan of Care Patient           Patient will benefit from skilled therapeutic intervention in order to improve the following deficits and impairments:  Increased edema,Pain,Decreased strength,Decreased range of motion,Impaired flexibility,Postural dysfunction,Impaired UE functional use  Visit Diagnosis: Acute pain of left shoulder  Stiffness of left shoulder, not elsewhere classified  Abnormal posture  Muscle weakness (generalized)  Localized edema     Problem List Patient Active Problem List   Diagnosis Date Noted  . Traumatic complete tear of left rotator cuff   . Biceps tendonitis on left   . Essential hypertension 06/23/2020  . Prediabetes 06/23/2020  . Family hx-stroke 06/23/2020  . Restless legs syndrome (RLS) 06/23/2020  . Embolic cerebral infarction [facility]) s/p tPA, source unknown  06/21/2020  . Obesity (BMI 30.0-34.9) 09/16/2012  . Hyperlipidemia LDL goal <70 09/06/2008  . FIBROMYALGIA 09/06/2008  . HEADACHE, CHRONIC 09/06/2008  . FIBROIDS, UTERUS 09/01/2008  . Hypothyroidism 09/01/2008  . GERD 09/01/2008      Laureen Abrahams, PT, DPT 03/05/21 3:57 PM    [place]  Physical Therapy [address] [place], [place], [address] Phone: [phone]   Fax:  [phone]  Name: [name] MRN: [id number] Date of Birth: [birth year]

## 2021-03-07 ENCOUNTER — Encounter: Payer: Self-pay | Admitting: Physical Therapy

## 2021-03-07 ENCOUNTER — Other Ambulatory Visit: Payer: Self-pay

## 2021-03-07 ENCOUNTER — Ambulatory Visit: Payer: 59 | Admitting: Physical Therapy

## 2021-03-07 DIAGNOSIS — M25612 Stiffness of left shoulder, not elsewhere classified: Secondary | ICD-10-CM | POA: Diagnosis not present

## 2021-03-07 DIAGNOSIS — R293 Abnormal posture: Secondary | ICD-10-CM

## 2021-03-07 DIAGNOSIS — R600 Localized edema: Secondary | ICD-10-CM | POA: Diagnosis not present

## 2021-03-07 DIAGNOSIS — M6281 Muscle weakness (generalized): Secondary | ICD-10-CM

## 2021-03-07 DIAGNOSIS — M25512 Pain in left shoulder: Secondary | ICD-10-CM

## 2021-03-07 NOTE — Therapy (Signed)
[facility] Physical Therapy [address] [place], [place], [address] Phone: [phone]   Fax:  [phone]  Physical Therapy Treatment  Patient Details  Name: [name] MRN: [id number] Date of Birth: 08-12-1961 Referring Provider (PT): Dr. Marlou Sa   Encounter Date: 03/07/2021   PT End of Session - 03/07/21 1150    Visit Number 4    Number of Visits 24    Date for PT Re-Evaluation 05/21/21    Authorization - Number of Visits 25    PT Start Time 1100    PT Stop Time 1148    PT Time Calculation (min) 48 min    Activity Tolerance Patient tolerated treatment well    Behavior During Therapy [facility] for tasks assessed/performed           Past Medical History:  Diagnosis Date  . Abnormal Pap smear of cervix   . Anemia 2007   due to fibroids  . Duodenitis without hemorrhage   . Esophageal stricture   . Esophagitis   . Fibromyalgia   . GERD (gastroesophageal reflux disease)   . Hiatal hernia   . History of loop recorder 06/23/2020   had CVA 06-21-20  . Hyperlipidemia   . Hypertension   . Hypothyroidism   . Leiomyoma of uterus, unspecified    fibroids  . Pneumonia    aspiration pneumonia after balloon surgery for weight loss  . Stroke ([place]) 06/21/2020   Right MCA infarct-recieved TPA    Past Surgical History:  Procedure Laterality Date  . ABDOMINAL HYSTERECTOMY    . BICEPT TENODESIS Right 12/07/2019   Procedure: BICEPS TENODESIS;  Surgeon: Meredith Pel, MD;  Location: [place];  Service: Orthopedics;  Laterality: Right;  . BUBBLE STUDY  06/23/2020   Procedure: BUBBLE STUDY;  Surgeon: Donato Heinz, MD;  Location: [facility] ENDOSCOPY;  Service: Cardiovascular;;  . CARPAL TUNNEL RELEASE  2014   right wrist  . CHOLECYSTECTOMY    . HYSTEROSCOPY W/ ENDOMETRIAL ABLATION  4/05  . LOOP RECORDER INSERTION N/A 06/23/2020   Procedure: LOOP RECORDER INSERTION;  Surgeon: Constance Haw, MD;  Location: [place] CV LAB;   Service: Cardiovascular;  Laterality: N/A;  . RECTOCELE REPAIR  2007  . SHOULDER ARTHROSCOPY WITH OPEN ROTATOR CUFF REPAIR Right 12/07/2019   Procedure: RIGHT SHOULDER ARTHROSCOPY,   MINI OPEN SUBSCAPULARIS, AND SUPRASPINATUS REPAIR, OPEN BICEPS TENODESIS;  Surgeon: Meredith Pel, MD;  Location: [place];  Service: Orthopedics;  Laterality: Right;  . SHOULDER ARTHROSCOPY WITH ROTATOR CUFF REPAIR AND OPEN BICEPS TENODESIS Left 01/22/2021   Procedure: left shoulder arthroscopy debridement, mini open rotator cuff tear repair, biceps tenodesis;  Surgeon: Meredith Pel, MD;  Location: [place];  Service: Orthopedics;  Laterality: Left;  NEEDS RNFA PLEASE  . TEE WITHOUT CARDIOVERSION N/A 06/23/2020   Procedure: TRANSESOPHAGEAL ECHOCARDIOGRAM (TEE);  Surgeon: Donato Heinz, MD;  Location[facility] ENDOSCOPY;  Service: Cardiovascular;  Laterality: N/A;  . TONSILLECTOMY     as child  . TOTAL VAGINAL HYSTERECTOMY  5/07  . TUBAL LIGATION  1991   BILATERAL    There were no vitals filed for this visit.   Subjective Assessment - 03/07/21 1102    Subjective had to sleep in recliner the other night because shoulder is so sore from increased activity.  otherwise doing well.    Limitations Lifting    Patient Stated Goals regain use of LUE    Currently in Pain? Yes    Pain  Score 2     Pain Location Shoulder    Pain Orientation Left    Pain Descriptors / Indicators Nagging;Throbbing    Pain Onset More than a month ago    Pain Frequency Intermittent    Aggravating Factors  sleeping, reaching up    Pain Relieving Factors topical creams                             [facility] Adult PT Treatment/Exercise - 03/07/21 1104      Shoulder Exercises: Supine   Flexion Left;AROM;20 reps      Shoulder Exercises: Sidelying   External Rotation Left;20 reps    ABduction Left;20 reps      Shoulder Exercises: Standing   Other Standing Exercises wall  ladder flex/scaption x 10 reps; Lt      Shoulder Exercises: Pulleys   Flexion 3 minutes    Flexion Limitations 5 sec holds    Scaption 3 minutes    Scaption Limitations 5 sec holds      Shoulder Exercises: ROM/Strengthening   Proximal Shoulder Strengthening, Supine Lt, 1#; circles x20 reps CW/CCW      Vasopneumatic   Number Minutes Vasopneumatic  10 minutes    Vasopnuematic Location  Shoulder    Vasopneumatic Pressure Low    Vasopneumatic Temperature  34      Manual Therapy   Passive ROM Lt shoulder flexion/abduction/ir/er in supine to tolerance; grade 2-3 inf/A-P mobs                    PT Short Term Goals - 02/28/21 1159      PT SHORT TERM GOAL #1   Title Patient will demonstrate independent use of home exercise program to maintain progress from in clinic treatments.    Time 4    Period Weeks    Status On-going    Target Date 03/26/21             PT Long Term Goals - 02/26/21 1112      PT LONG TERM GOAL #1   Title Patient will demonstrate/report pain at worst less than or equal to 2/10 to facilitate minimal limitation in daily activity secondary to pain symptoms.    Time 12    Period Weeks    Status New    Target Date 05/21/21      PT LONG TERM GOAL #2   Title Patient will demonstrate independent use of home exercise program to facilitate ability to maintain/progress functional gains from skilled physical therapy services.    Time 12    Status New    Target Date 05/21/21      PT LONG TERM GOAL #3   Title Patient will demonstrate return to work/recreational activity at previous level of function without limitations secondary due to condition    Time 12    Period Weeks    Status On-going    Target Date 05/21/21      PT LONG TERM GOAL #4   Title Pt. will demonstrate Lt GH joint AROM WFL s symptoms to facilitate usual dressing, self care, reaching at PLOF.    Time 12    Period Weeks    Status New    Target Date 05/21/21      PT LONG TERM GOAL  #5   Title Pt. will demonstrate RUE MMT 5/5 throughout to facilitate usual lifting, carrying in household and work activity at [employer].    Time  12    Period Weeks    Status New    Target Date 05/21/21      Additional Long Term Goals   Additional Long Term Goals Yes      PT LONG TERM GOAL #6   Title FOTO score improved to 68 for improved function    Time 12    Period Weeks    Status New    Target Date 05/21/21                 Plan - 03/07/21 1150    Clinical Impression Statement Pt with expected soreness following increased activity last session. Tolerated session well today with continued focus on maximizing ROM as well as beginning light strengthening.  Will continue to benefit from PT to maximize function.    Personal Factors and Comorbidities Comorbidity 3+    Comorbidities Rt RTC repair, fibromyalgia, HTN, HLD, CVA 05/2020 s/p tPA    Examination-Activity Limitations Bathing;Bed Mobility;Sleep;Carry;Lift;Hygiene/Grooming;Dressing;Reach Overhead    Examination-Participation Restrictions Cleaning;Community Activity;Driving;Laundry;Meal Prep;Occupation    Stability/Clinical Decision Making Evolving/Moderate complexity    Rehab Potential Good    PT Frequency 2x / week    PT Duration 12 weeks    PT Treatment/Interventions ADLs/Self Care Home Management;Cryotherapy;Electrical Stimulation;Moist Heat;Therapeutic exercise;Therapeutic activities;Ultrasound;Neuromuscular re-education;Patient/family education;Manual techniques;Vasopneumatic Device;Taping;Dry needling;Passive range of motion    PT Next Visit Plan see how AROM exercises are going, vaso PRN for pain, manual for modalities, AROM/RTC strengthening, ?BFR with hx of CVA    PT Home Exercise Plan Access Code: [email or web address]    Consulted and Agree with Plan of Care Patient           Patient will benefit from skilled therapeutic intervention in order to improve the following deficits and impairments:  Increased edema,Pain,Decreased  strength,Decreased range of motion,Impaired flexibility,Postural dysfunction,Impaired UE functional use  Visit Diagnosis: Acute pain of left shoulder  Stiffness of left shoulder, not elsewhere classified  Abnormal posture  Muscle weakness (generalized)  Localized edema     Problem List Patient Active Problem List   Diagnosis Date Noted  . Traumatic complete tear of left rotator cuff   . Biceps tendonitis on left   . Essential hypertension 06/23/2020  . Prediabetes 06/23/2020  . Family hx-stroke 06/23/2020  . Restless legs syndrome (RLS) 06/23/2020  . Embolic cerebral infarction [facility]) s/p tPA, source unknown  06/21/2020  . Obesity (BMI 30.0-34.9) 09/16/2012  . Hyperlipidemia LDL goal <70 09/06/2008  . FIBROMYALGIA 09/06/2008  . HEADACHE, CHRONIC 09/06/2008  . FIBROIDS, UTERUS 09/01/2008  . Hypothyroidism 09/01/2008  . GERD 09/01/2008      Laureen Abrahams, PT, DPT 03/07/21 11:53 AM    [facility] Physical Therapy [address] [place], [place], [address] Phone: [phone]   Fax:  [phone]  Name: [name] MRN: [id number] Date of Birth: [birth year]

## 2021-03-11 MED FILL — Erenumab-aooe Subcutaneous Soln Auto-Injector 140 MG/ML: SUBCUTANEOUS | 28 days supply | Qty: 1 | Fill #0 | Status: AC

## 2021-03-12 ENCOUNTER — Other Ambulatory Visit: Payer: Self-pay

## 2021-03-12 ENCOUNTER — Encounter: Payer: Self-pay | Admitting: Rehabilitative and Restorative Service Providers"

## 2021-03-12 ENCOUNTER — Other Ambulatory Visit (HOSPITAL_COMMUNITY): Payer: Self-pay

## 2021-03-12 ENCOUNTER — Ambulatory Visit: Payer: 59 | Admitting: Rehabilitative and Restorative Service Providers"

## 2021-03-12 DIAGNOSIS — R600 Localized edema: Secondary | ICD-10-CM

## 2021-03-12 DIAGNOSIS — M25612 Stiffness of left shoulder, not elsewhere classified: Secondary | ICD-10-CM

## 2021-03-12 DIAGNOSIS — R293 Abnormal posture: Secondary | ICD-10-CM

## 2021-03-12 DIAGNOSIS — M6281 Muscle weakness (generalized): Secondary | ICD-10-CM | POA: Diagnosis not present

## 2021-03-12 DIAGNOSIS — M25512 Pain in left shoulder: Secondary | ICD-10-CM

## 2021-03-12 LAB — CUP PACEART REMOTE DEVICE CHECK
Date Time Interrogation Session: 20220417000433
Implantable Pulse Generator Implant Date: 20210730

## 2021-03-12 NOTE — Therapy (Signed)
[facility] [address] [place], [place], [address] Phone: [phone]   Fax:  [phone]  Physical Therapy Treatment/Progress note  Patient Details  Name: [name] MRN: [id number] Date of Birth: [birth year] Referring Provider (PT): Dr. Marlou Sa   Encounter Date: 03/12/2021   Progress Note Reporting Period 02/26/2021 to 03/12/2021  See note below for Objective Data and Assessment of Progress/Goals.        PT End of Session - 03/12/21 0812    Visit Number 5    Number of Visits 24    Date for PT Re-Evaluation 05/21/21    Authorization - Number of Visits 25    Progress Note Due on Visit 15    PT Start Time 0800    PT Stop Time 0849    PT Time Calculation (min) 49 min    Activity Tolerance Patient tolerated treatment well    Behavior During Therapy [facility] for tasks assessed/performed           Past Medical History:  Diagnosis Date  . Abnormal Pap smear of cervix   . Anemia 2007   due to fibroids  . Duodenitis without hemorrhage   . Esophageal stricture   . Esophagitis   . Fibromyalgia   . GERD (gastroesophageal reflux disease)   . Hiatal hernia   . History of loop recorder 06/23/2020   had CVA 06-21-20  . Hyperlipidemia   . Hypertension   . Hypothyroidism   . Leiomyoma of uterus, unspecified    fibroids  . Pneumonia    aspiration pneumonia after balloon surgery for weight loss  . Stroke ([place]) 06/21/2020   Right MCA infarct-recieved TPA    Past Surgical History:  Procedure Laterality Date  . ABDOMINAL HYSTERECTOMY    . BICEPT TENODESIS Right 12/07/2019   Procedure: BICEPS TENODESIS;  Surgeon: Meredith Pel, MD;  Location: [place];  Service: Orthopedics;  Laterality: Right;  . BUBBLE STUDY  06/23/2020   Procedure: BUBBLE STUDY;  Surgeon: Donato Heinz, MD;  Location: [facility] ENDOSCOPY;  Service: Cardiovascular;;  . CARPAL TUNNEL RELEASE  2014   right wrist  . CHOLECYSTECTOMY    .  HYSTEROSCOPY W/ ENDOMETRIAL ABLATION  4/05  . LOOP RECORDER INSERTION N/A 06/23/2020   Procedure: LOOP RECORDER INSERTION;  Surgeon: Constance Haw, MD;  Location: [place] CV LAB;  Service: Cardiovascular;  Laterality: N/A;  . RECTOCELE REPAIR  2007  . SHOULDER ARTHROSCOPY WITH OPEN ROTATOR CUFF REPAIR Right 12/07/2019   Procedure: RIGHT SHOULDER ARTHROSCOPY,   MINI OPEN SUBSCAPULARIS, AND SUPRASPINATUS REPAIR, OPEN BICEPS TENODESIS;  Surgeon: Meredith Pel, MD;  Location: [place];  Service: Orthopedics;  Laterality: Right;  . SHOULDER ARTHROSCOPY WITH ROTATOR CUFF REPAIR AND OPEN BICEPS TENODESIS Left 01/22/2021   Procedure: left shoulder arthroscopy debridement, mini open rotator cuff tear repair, biceps tenodesis;  Surgeon: Meredith Pel, MD;  Location: [place];  Service: Orthopedics;  Laterality: Left;  NEEDS RNFA PLEASE  . TEE WITHOUT CARDIOVERSION N/A 06/23/2020   Procedure: TRANSESOPHAGEAL ECHOCARDIOGRAM (TEE);  Surgeon: Donato Heinz, MD;  Location: [facility] ENDOSCOPY;  Service: Cardiovascular;  Laterality: N/A;  . TONSILLECTOMY     as child  . TOTAL VAGINAL HYSTERECTOMY  5/07  . TUBAL LIGATION  1991   BILATERAL    There were no vitals filed for this visit.   Subjective Assessment - 03/12/21 0804    Subjective Pt. stated achy today, around 5/10 or so.  Consistent ache  indicated.  Pt. stated feeling like she is getting better with her arm.    Limitations Lifting    Patient Stated Goals regain use of LUE    Pain Score 5     Pain Location Shoulder    Pain Orientation Left    Pain Descriptors / Indicators Aching    Pain Onset More than a month ago    Pain Frequency Constant    Aggravating Factors  insidious ache    Pain Relieving Factors light movement              [facility] PT Assessment - 03/12/21 0001      Assessment   Medical Diagnosis Lt Mini open rotator cuff biceps tenodesis    Referring Provider (PT) Dr. Marlou Sa     Onset Date/Surgical Date 01/22/21    Hand Dominance Right      AROM   Overall AROM Comments Pain end range flexion, abduction primarily on Lt shoulder    Left Shoulder Flexion 126 Degrees   in supine   Left Shoulder ABduction 100 Degrees   supine   Left Shoulder Internal Rotation 75 Degrees   45 degrees abduction supine   Left Shoulder External Rotation 65 Degrees   v     PROM   Overall PROM Comments Pain end range flexion, abduction primarily on Lt shoulder    Left Shoulder Flexion 135 Degrees   in supine   Left Shoulder ABduction 110 Degrees   supine   Left Shoulder Internal Rotation 75 Degrees   45 degrees abduction supine   Left Shoulder External Rotation 70 Degrees   45 degrees abduction supine     Strength   Strength Assessment Site Shoulder    Right/Left Shoulder Left                         [facility] Adult PT Treatment/Exercise - 03/12/21 0001      Shoulder Exercises: Supine   Flexion Left   to fatigue x 25, x20     Shoulder Exercises: Sidelying   External Rotation Left   x 30 (to fatigue)   ABduction Left   x 20  (to fatigue)     Shoulder Exercises: Standing   Row Both;Strengthening   3 x 10   Theraband Level (Shoulder Row) Level 3 (Green)    Other Standing Exercises wall ladder flex/scaption x 10 reps; Lt      Shoulder Exercises: Pulleys   Other Pulley Exercises Pulleys deferred to home use per Pt. today      Shoulder Exercises: ROM/Strengthening   Proximal Shoulder Strengthening, Supine Lt, 1#; circles 2 x20 reps CW/CCW each in supine 90 degrees flexion      Vasopneumatic   Number Minutes Vasopneumatic  10 minutes    Vasopnuematic Location  Shoulder    Vasopneumatic Pressure Low    Vasopneumatic Temperature  34      Manual Therapy   Manual therapy comments g3 inferior jt mobs in flexion, scaption, abduction Lt shoulder.  Mobilization c movement in 70 deg abduction for active ER c posterior glide hold x 10                    PT  Short Term Goals - 02/28/21 1159      PT SHORT TERM GOAL #1   Title Patient will demonstrate independent use of home exercise program to maintain progress from in clinic treatments.    Time 4  Period Weeks    Status On-going    Target Date 03/26/21             PT Long Term Goals - 03/12/21 0822      PT LONG TERM GOAL #1   Title Patient will demonstrate/report pain at worst less than or equal to 2/10 to facilitate minimal limitation in daily activity secondary to pain symptoms.    Time 12    Period Weeks    Status On-going    Target Date 05/21/21      PT LONG TERM GOAL #2   Title Patient will demonstrate independent use of home exercise program to facilitate ability to maintain/progress functional gains from skilled physical therapy services.    Time 12    Status On-going    Target Date 05/21/21      PT LONG TERM GOAL #3   Title Patient will demonstrate return to work/recreational activity at previous level of function without limitations secondary due to condition    Time 12    Period Weeks    Status On-going    Target Date 05/21/21      PT LONG TERM GOAL #4   Title Pt. will demonstrate Lt GH joint AROM WFL s symptoms to facilitate usual dressing, self care, reaching at PLOF.    Time 12    Period Weeks    Status On-going    Target Date 05/21/21      PT LONG TERM GOAL #5   Title Pt. will demonstrate RUE MMT 5/5 throughout to facilitate usual lifting, carrying in household and work activity at [employer].    Time 12    Period Weeks    Status On-going    Target Date 05/21/21      PT LONG TERM GOAL #6   Title FOTO score improved to 68 for improved function    Time 12    Period Weeks    Status On-going    Target Date 05/21/21                 Plan - 03/12/21 0819    Clinical Impression Statement Pt. has attended 5 visits overall during course of treatment. Global Rating of Change +3 somewhat better since evaluation.  See objective data for updated information.   Pt. continued to show improvement in passive and active movement to this point.  Overall strength deficits noted as Pt. is early in active movement/strengthening intervention per post surgical progression.  Pt. to continue to benefit from skilled PT services at this time    Personal Factors and Comorbidities Comorbidity 3+    Comorbidities Rt RTC repair, fibromyalgia, HTN, HLD, CVA 05/2020 s/p tPA    Examination-Activity Limitations Bathing;Bed Mobility;Sleep;Carry;Lift;Hygiene/Grooming;Dressing;Reach Overhead    Examination-Participation Restrictions Cleaning;Community Activity;Driving;Laundry;Meal Prep;Occupation    Stability/Clinical Decision Making Evolving/Moderate complexity    Clinical Decision Making Moderate    Rehab Potential Good    PT Frequency 2x / week    PT Duration 12 weeks    PT Treatment/Interventions ADLs/Self Care Home Management;Cryotherapy;Electrical Stimulation;Moist Heat;Therapeutic exercise;Therapeutic activities;Ultrasound;Neuromuscular re-education;Patient/family education;Manual techniques;Vasopneumatic Device;Taping;Dry needling;Passive range of motion    PT Next Visit Plan vaso PRN for pain, manual for modalities, AROM/RTC strengthening in supine/sidelying (gravity reduced due to early stages of protocol), ?BFR with hx of CVA    PT Home Exercise Plan Access Code: [email or web address]    Consulted and Agree with Plan of Care Patient           Patient will benefit from skilled therapeutic  intervention in order to improve the following deficits and impairments:  Increased edema,Pain,Decreased strength,Decreased range of motion,Impaired flexibility,Postural dysfunction,Impaired UE functional use  Visit Diagnosis: Acute pain of left shoulder  Stiffness of left shoulder, not elsewhere classified  Abnormal posture  Muscle weakness (generalized)  Localized edema     Problem List Patient Active Problem List   Diagnosis Date Noted  . Traumatic complete tear of left  rotator cuff   . Biceps tendonitis on left   . Essential hypertension 06/23/2020  . Prediabetes 06/23/2020  . Family hx-stroke 06/23/2020  . Restless legs syndrome (RLS) 06/23/2020  . Embolic cerebral infarction [facility]) s/p tPA, source unknown  06/21/2020  . Obesity (BMI 30.0-34.9) 09/16/2012  . Hyperlipidemia LDL goal <70 09/06/2008  . FIBROMYALGIA 09/06/2008  . HEADACHE, CHRONIC 09/06/2008  . FIBROIDS, UTERUS 09/01/2008  . Hypothyroidism 09/01/2008  . GERD 09/01/2008    Scot Jun, PT, DPT, OCS, ATC 03/12/21  8:40 AM    [facility] Physical Therapy [address] [place], [place], [address] Phone: [phone]   Fax:  [phone]  Name: [name] MRN: [id number] Date of Birth: [birth year]

## 2021-03-14 ENCOUNTER — Ambulatory Visit (INDEPENDENT_AMBULATORY_CARE_PROVIDER_SITE_OTHER): Payer: 59 | Admitting: Orthopedic Surgery

## 2021-03-14 ENCOUNTER — Encounter: Payer: 59 | Admitting: Rehabilitative and Restorative Service Providers"

## 2021-03-14 ENCOUNTER — Ambulatory Visit: Payer: 59 | Admitting: Rehabilitative and Restorative Service Providers"

## 2021-03-14 ENCOUNTER — Other Ambulatory Visit: Payer: Self-pay

## 2021-03-14 ENCOUNTER — Encounter: Payer: Self-pay | Admitting: Rehabilitative and Restorative Service Providers"

## 2021-03-14 DIAGNOSIS — M25512 Pain in left shoulder: Secondary | ICD-10-CM

## 2021-03-14 DIAGNOSIS — R600 Localized edema: Secondary | ICD-10-CM | POA: Diagnosis not present

## 2021-03-14 DIAGNOSIS — M6281 Muscle weakness (generalized): Secondary | ICD-10-CM | POA: Diagnosis not present

## 2021-03-14 DIAGNOSIS — R293 Abnormal posture: Secondary | ICD-10-CM | POA: Diagnosis not present

## 2021-03-14 DIAGNOSIS — M25612 Stiffness of left shoulder, not elsewhere classified: Secondary | ICD-10-CM | POA: Diagnosis not present

## 2021-03-14 DIAGNOSIS — S46012A Strain of muscle(s) and tendon(s) of the rotator cuff of left shoulder, initial encounter: Secondary | ICD-10-CM

## 2021-03-14 NOTE — Therapy (Signed)
[facility] Physical Therapy [address] [place], [place], [address] Phone: [phone]   Fax:  [phone]  Physical Therapy Treatment  Patient Details  Name: [name] MRN: [id number] Date of Birth: 06/15/1961 Referring Provider (PT): Dr. Marlou Sa   Encounter Date: 03/14/2021   PT End of Session - 03/14/21 1148    Visit Number 6    Number of Visits 24    Date for PT Re-Evaluation 05/21/21    Authorization - Number of Visits 25    Progress Note Due on Visit 15    PT Start Time 1143    PT Stop Time 1231    PT Time Calculation (min) 48 min    Activity Tolerance Patient tolerated treatment well    Behavior During Therapy [facility] for tasks assessed/performed           Past Medical History:  Diagnosis Date  . Abnormal Pap smear of cervix   . Anemia 2007   due to fibroids  . Duodenitis without hemorrhage   . Esophageal stricture   . Esophagitis   . Fibromyalgia   . GERD (gastroesophageal reflux disease)   . Hiatal hernia   . History of loop recorder 06/23/2020   had CVA 06-21-20  . Hyperlipidemia   . Hypertension   . Hypothyroidism   . Leiomyoma of uterus, unspecified    fibroids  . Pneumonia    aspiration pneumonia after balloon surgery for weight loss  . Stroke ([place]) 06/21/2020   Right MCA infarct-recieved TPA    Past Surgical History:  Procedure Laterality Date  . ABDOMINAL HYSTERECTOMY    . BICEPT TENODESIS Right 12/07/2019   Procedure: BICEPS TENODESIS;  Surgeon: Meredith Pel, MD;  Location: [place];  Service: Orthopedics;  Laterality: Right;  . BUBBLE STUDY  06/23/2020   Procedure: BUBBLE STUDY;  Surgeon: Donato Heinz, MD;  Location: [facility] ENDOSCOPY;  Service: Cardiovascular;;  . CARPAL TUNNEL RELEASE  2014   right wrist  . CHOLECYSTECTOMY    . HYSTEROSCOPY W/ ENDOMETRIAL ABLATION  4/05  . LOOP RECORDER INSERTION N/A 06/23/2020   Procedure: LOOP RECORDER INSERTION;  Surgeon: Constance Haw, MD;   Location: [place] CV LAB;  Service: Cardiovascular;  Laterality: N/A;  . RECTOCELE REPAIR  2007  . SHOULDER ARTHROSCOPY WITH OPEN ROTATOR CUFF REPAIR Right 12/07/2019   Procedure: RIGHT SHOULDER ARTHROSCOPY,   MINI OPEN SUBSCAPULARIS, AND SUPRASPINATUS REPAIR, OPEN BICEPS TENODESIS;  Surgeon: Meredith Pel, MD;  Location: [place];  Service: Orthopedics;  Laterality: Right;  . SHOULDER ARTHROSCOPY WITH ROTATOR CUFF REPAIR AND OPEN BICEPS TENODESIS Left 01/22/2021   Procedure: left shoulder arthroscopy debridement, mini open rotator cuff tear repair, biceps tenodesis;  Surgeon: Meredith Pel, MD;  Location: [place];  Service: Orthopedics;  Laterality: Left;  NEEDS RNFA PLEASE  . TEE WITHOUT CARDIOVERSION N/A 06/23/2020   Procedure: TRANSESOPHAGEAL ECHOCARDIOGRAM (TEE);  Surgeon: Donato Heinz, MD;  Location: [facility] ENDOSCOPY;  Service: Cardiovascular;  Laterality: N/A;  . TONSILLECTOMY     as child  . TOTAL VAGINAL HYSTERECTOMY  5/07  . TUBAL LIGATION  1991   BILATERAL    There were no vitals filed for this visit.   Subjective Assessment - 03/14/21 1147    Subjective Pt. stated no specifc worsened symptoms, some irritation from MD visit checks.  Going back to work May 2 with 10 lbs lifting limit.    Limitations Lifting    Patient Stated Goals regain use  of LUE    Currently in Pain? Yes    Pain Score 4     Pain Location Shoulder    Pain Orientation Left    Pain Descriptors / Indicators Aching;Sore    Pain Onset More than a month ago    Pain Frequency Constant    Aggravating Factors  checks from MD visit today, sore some after exercises.    Pain Relieving Factors better with movement                             [facility] Adult PT Treatment/Exercise - 03/14/21 0001      Shoulder Exercises: Supine   Horizontal ABduction Both;Theraband   2 x 10   Theraband Level (Shoulder Horizontal ABduction) Level 3 (Green)     Flexion Left   1 lb x 5, to fatigue 0 lb x20     Shoulder Exercises: Prone   Other Prone Exercises prone scapular retraction c gh ext to neutral 5 sec hold x 10      Shoulder Exercises: Sidelying   External Rotation --   held today with use of tband walk outs in standing   ABduction Left   to fatigue x 25     Shoulder Exercises: Standing   Other Standing Exercises ER at side with towel walk outs green band 5 sec hold x 10 Lt arm      Shoulder Exercises: ROM/Strengthening   Proximal Shoulder Strengthening, Supine Lt 2 lbs; circles 2 x20 reps CW/CCW each in supine 90 degrees flexion      Vasopneumatic   Number Minutes Vasopneumatic  10 minutes    Vasopnuematic Location  Shoulder    Vasopneumatic Pressure Low    Vasopneumatic Temperature  34      Manual Therapy   Manual therapy comments g3 inferior jt mobs in flexion, scaption, abduction Lt shoulder. Posterior mobs in ER end range in 70 deg abduction.  Contract/relax for IR/ER end range progression                    PT Short Term Goals - 02/28/21 1159      PT SHORT TERM GOAL #1   Title Patient will demonstrate independent use of home exercise program to maintain progress from in clinic treatments.    Time 4    Period Weeks    Status On-going    Target Date 03/26/21             PT Long Term Goals - 03/12/21 1761      PT LONG TERM GOAL #1   Title Patient will demonstrate/report pain at worst less than or equal to 2/10 to facilitate minimal limitation in daily activity secondary to pain symptoms.    Time 12    Period Weeks    Status On-going    Target Date 05/21/21      PT LONG TERM GOAL #2   Title Patient will demonstrate independent use of home exercise program to facilitate ability to maintain/progress functional gains from skilled physical therapy services.    Time 12    Status On-going    Target Date 05/21/21      PT LONG TERM GOAL #3   Title Patient will demonstrate return to work/recreational  activity at previous level of function without limitations secondary due to condition    Time 12    Period Weeks    Status On-going    Target Date 05/21/21  PT LONG TERM GOAL #4   Title Pt. will demonstrate Lt GH joint AROM WFL s symptoms to facilitate usual dressing, self care, reaching at PLOF.    Time 12    Period Weeks    Status On-going    Target Date 05/21/21      PT LONG TERM GOAL #5   Title Pt. will demonstrate RUE MMT 5/5 throughout to facilitate usual lifting, carrying in household and work activity at [employer].    Time 12    Period Weeks    Status On-going    Target Date 05/21/21      PT LONG TERM GOAL #6   Title FOTO score improved to 68 for improved function    Time 12    Period Weeks    Status On-going    Target Date 05/21/21                 Plan - 03/14/21 1214    Clinical Impression Statement Continued good overall progression noted in early strengthening active movement with typical fatigue noted in intervention.  Will continue to benefit from skilled PT services at this time c careful transitioning to gravity resisted active movement as appropriate (no shrug).    Personal Factors and Comorbidities Comorbidity 3+    Comorbidities Rt RTC repair, fibromyalgia, HTN, HLD, CVA 05/2020 s/p tPA    Examination-Activity Limitations Bathing;Bed Mobility;Sleep;Carry;Lift;Hygiene/Grooming;Dressing;Reach Overhead    Examination-Participation Restrictions Cleaning;Community Activity;Driving;Laundry;Meal Prep;Occupation    Stability/Clinical Decision Making Evolving/Moderate complexity    Rehab Potential Good    PT Frequency 2x / week    PT Duration 12 weeks    PT Treatment/Interventions ADLs/Self Care Home Management;Cryotherapy;Electrical Stimulation;Moist Heat;Therapeutic exercise;Therapeutic activities;Ultrasound;Neuromuscular re-education;Patient/family education;Manual techniques;Vasopneumatic Device;Taping;Dry needling;Passive range of motion    PT Next Visit  Plan manual for end range mobility gains, AROM/RTC strengthening in supine/sidelying c progression to prone, sitting as appropriate.    PT Home Exercise Plan Access Code: [email or web address]    Consulted and Agree with Plan of Care Patient           Patient will benefit from skilled therapeutic intervention in order to improve the following deficits and impairments:  Increased edema,Pain,Decreased strength,Decreased range of motion,Impaired flexibility,Postural dysfunction,Impaired UE functional use  Visit Diagnosis: Acute pain of left shoulder  Stiffness of left shoulder, not elsewhere classified  Abnormal posture  Muscle weakness (generalized)  Localized edema     Problem List Patient Active Problem List   Diagnosis Date Noted  . Traumatic complete tear of left rotator cuff   . Biceps tendonitis on left   . Essential hypertension 06/23/2020  . Prediabetes 06/23/2020  . Family hx-stroke 06/23/2020  . Restless legs syndrome (RLS) 06/23/2020  . Embolic cerebral infarction [facility]) s/p tPA, source unknown  06/21/2020  . Obesity (BMI 30.0-34.9) 09/16/2012  . Hyperlipidemia LDL goal <70 09/06/2008  . FIBROMYALGIA 09/06/2008  . HEADACHE, CHRONIC 09/06/2008  . FIBROIDS, UTERUS 09/01/2008  . Hypothyroidism 09/01/2008  . GERD 09/01/2008    Scot Jun, PT, DPT, OCS, ATC 03/14/21  12:24 PM    [facility] [address] [place], [place], [address] Phone: [phone]   Fax:  [phone]  Name: [name] MRN: [id number] Date of Birth: 01-11-1961

## 2021-03-18 ENCOUNTER — Encounter: Payer: Self-pay | Admitting: Orthopedic Surgery

## 2021-03-18 NOTE — Progress Notes (Signed)
Post-Op Visit Note   Patient: [name]           Date of Birth: 1961-09-26           MRN: [id number] Visit Date: 03/14/2021 PCP: Crist Infante, MD   Assessment & Plan:  Chief Complaint:  Chief Complaint  Patient presents with  . Left Shoulder - Follow-up   Visit Diagnoses:  1. Traumatic complete tear of left rotator cuff, initial encounter     Plan: Fiana is a patient who is now about 6 weeks out left shoulder arthroscopy with debridement and rotator cuff tear of a large rotator cuff repair.  Range of motion is 40/75/105.  Doing physical therapy with wall walking pulley and exercise band.  Rotator cuff strength feels good.  Shoulder remains a little stiff but there is no coarse grinding or crepitus with passive range of motion.  Okay to return to work May 2 with no lifting more than 10 pounds with the left arm until June 15.  Follow-up in 8 weeks for final check and release.  Follow-Up Instructions: Return in about 8 weeks (around 05/09/2021).   Orders:  No orders of the defined types were placed in this encounter.  No orders of the defined types were placed in this encounter.   Imaging: No results found.  PMFS History: Patient Active Problem List   Diagnosis Date Noted  . Traumatic complete tear of left rotator cuff   . Biceps tendonitis on left   . Essential hypertension 06/23/2020  . Prediabetes 06/23/2020  . Family hx-stroke 06/23/2020  . Restless legs syndrome (RLS) 06/23/2020  . Embolic cerebral infarction [facility]) s/p tPA, source unknown  06/21/2020  . Obesity (BMI 30.0-34.9) 09/16/2012  . Hyperlipidemia LDL goal <70 09/06/2008  . FIBROMYALGIA 09/06/2008  . HEADACHE, CHRONIC 09/06/2008  . FIBROIDS, UTERUS 09/01/2008  . Hypothyroidism 09/01/2008  . GERD 09/01/2008   Past Medical History:  Diagnosis Date  . Abnormal Pap smear of cervix   . Anemia 2007   due to fibroids  . Duodenitis without hemorrhage   . Esophageal stricture   . Esophagitis   .  Fibromyalgia   . GERD (gastroesophageal reflux disease)   . Hiatal hernia   . History of loop recorder 06/23/2020   had CVA 06-21-20  . Hyperlipidemia   . Hypertension   . Hypothyroidism   . Leiomyoma of uterus, unspecified    fibroids  . Pneumonia    aspiration pneumonia after balloon surgery for weight loss  . Stroke [facility]) 06/21/2020   Right MCA infarct-recieved TPA    Family History  Problem Relation Age of Onset  . Celiac disease Mother   . Osteoporosis Mother   . Heart disease Father   . Stroke Father   . Irritable bowel syndrome Sister   . Lung cancer Maternal Grandmother   . Breast cancer Paternal Grandmother   . Heart disease Sister   . Melanoma Sister   . Colon cancer Neg Hx   . Esophageal cancer Neg Hx   . Stomach cancer Neg Hx   . Rectal cancer Neg Hx     Past Surgical History:  Procedure Laterality Date  . ABDOMINAL HYSTERECTOMY    . BICEPT TENODESIS Right 12/07/2019   Procedure: BICEPS TENODESIS;  Surgeon: Meredith Pel, MD;  Location: [place];  Service: Orthopedics;  Laterality: Right;  . BUBBLE STUDY  06/23/2020   Procedure: BUBBLE STUDY;  Surgeon: Donato Heinz, MD;  Location: [facility] ENDOSCOPY;  Service: Cardiovascular;;  . CARPAL TUNNEL RELEASE  2014   right wrist  . CHOLECYSTECTOMY    . HYSTEROSCOPY W/ ENDOMETRIAL ABLATION  4/05  . LOOP RECORDER INSERTION N/A 06/23/2020   Procedure: LOOP RECORDER INSERTION;  Surgeon: Constance Haw, MD;  Location: [place] CV LAB;  Service: Cardiovascular;  Laterality: N/A;  . RECTOCELE REPAIR  2007  . SHOULDER ARTHROSCOPY WITH OPEN ROTATOR CUFF REPAIR Right 12/07/2019   Procedure: RIGHT SHOULDER ARTHROSCOPY,   MINI OPEN SUBSCAPULARIS, AND SUPRASPINATUS REPAIR, OPEN BICEPS TENODESIS;  Surgeon: Meredith Pel, MD;  Location: [place];  Service: Orthopedics;  Laterality: Right;  . SHOULDER ARTHROSCOPY WITH ROTATOR CUFF REPAIR AND OPEN BICEPS TENODESIS Left 01/22/2021    Procedure: left shoulder arthroscopy debridement, mini open rotator cuff tear repair, biceps tenodesis;  Surgeon: Meredith Pel, MD;  Location: [place];  Service: Orthopedics;  Laterality: Left;  NEEDS RNFA PLEASE  . TEE WITHOUT CARDIOVERSION N/A 06/23/2020   Procedure: TRANSESOPHAGEAL ECHOCARDIOGRAM (TEE);  Surgeon: Donato Heinz, MD;  Location: [facility] ENDOSCOPY;  Service: Cardiovascular;  Laterality: N/A;  . TONSILLECTOMY     as child  . TOTAL VAGINAL HYSTERECTOMY  5/07  . TUBAL LIGATION  1991   BILATERAL   Social History   Occupational History  . Occupation: Nurse  Tobacco Use  . Smoking status: Former Smoker    Quit date: 12/23/2002    Years since quitting: 18.2  . Smokeless tobacco: Never Used  Vaping Use  . Vaping Use: Never used  Substance and Sexual Activity  . Alcohol use: Not Currently  . Drug use: No  . Sexual activity: Yes    Partners: Male    Birth control/protection: Surgical    Comment: [facility]

## 2021-03-20 NOTE — Progress Notes (Signed)
Carelink Summary Report / Loop Recorder 

## 2021-03-21 ENCOUNTER — Encounter: Payer: Self-pay | Admitting: Rehabilitative and Restorative Service Providers"

## 2021-03-21 ENCOUNTER — Ambulatory Visit: Payer: 59 | Admitting: Rehabilitative and Restorative Service Providers"

## 2021-03-21 ENCOUNTER — Other Ambulatory Visit: Payer: Self-pay

## 2021-03-21 DIAGNOSIS — M6281 Muscle weakness (generalized): Secondary | ICD-10-CM

## 2021-03-21 DIAGNOSIS — M25512 Pain in left shoulder: Secondary | ICD-10-CM | POA: Diagnosis not present

## 2021-03-21 DIAGNOSIS — M25612 Stiffness of left shoulder, not elsewhere classified: Secondary | ICD-10-CM | POA: Diagnosis not present

## 2021-03-21 DIAGNOSIS — R293 Abnormal posture: Secondary | ICD-10-CM

## 2021-03-21 DIAGNOSIS — R600 Localized edema: Secondary | ICD-10-CM

## 2021-03-21 NOTE — Therapy (Signed)
[facility] Physical Therapy [address] [place], [place], [address] Phone: [phone]   Fax:  [phone]  Physical Therapy Treatment  Patient Details  Name: [name] MRN: [id number] Date of Birth: 02-01-1961 Referring Provider (PT): Dr. Marlou Sa   Encounter Date: 03/21/2021   PT End of Session - 03/21/21 1220    Visit Number 7    Number of Visits 24    Date for PT Re-Evaluation 05/21/21    Authorization - Number of Visits 25    Progress Note Due on Visit 15    PT Start Time 1145    PT Stop Time 1225    PT Time Calculation (min) 40 min    Activity Tolerance Patient tolerated treatment well    Behavior During Therapy [facility] for tasks assessed/performed           Past Medical History:  Diagnosis Date  . Abnormal Pap smear of cervix   . Anemia 2007   due to fibroids  . Duodenitis without hemorrhage   . Esophageal stricture   . Esophagitis   . Fibromyalgia   . GERD (gastroesophageal reflux disease)   . Hiatal hernia   . History of loop recorder 06/23/2020   had CVA 06-21-20  . Hyperlipidemia   . Hypertension   . Hypothyroidism   . Leiomyoma of uterus, unspecified    fibroids  . Pneumonia    aspiration pneumonia after balloon surgery for weight loss  . Stroke ([place]) 06/21/2020   Right MCA infarct-recieved TPA    Past Surgical History:  Procedure Laterality Date  . ABDOMINAL HYSTERECTOMY    . BICEPT TENODESIS Right 12/07/2019   Procedure: BICEPS TENODESIS;  Surgeon: Meredith Pel, MD;  Location: [place];  Service: Orthopedics;  Laterality: Right;  . BUBBLE STUDY  06/23/2020   Procedure: BUBBLE STUDY;  Surgeon: Donato Heinz, MD;  Location: [facility] ENDOSCOPY;  Service: Cardiovascular;;  . CARPAL TUNNEL RELEASE  2014   right wrist  . CHOLECYSTECTOMY    . HYSTEROSCOPY W/ ENDOMETRIAL ABLATION  4/05  . LOOP RECORDER INSERTION N/A 06/23/2020   Procedure: LOOP RECORDER INSERTION;  Surgeon: Constance Haw, MD;   Location: [place] CV LAB;  Service: Cardiovascular;  Laterality: N/A;  . RECTOCELE REPAIR  2007  . SHOULDER ARTHROSCOPY WITH OPEN ROTATOR CUFF REPAIR Right 12/07/2019   Procedure: RIGHT SHOULDER ARTHROSCOPY,   MINI OPEN SUBSCAPULARIS, AND SUPRASPINATUS REPAIR, OPEN BICEPS TENODESIS;  Surgeon: Meredith Pel, MD;  Location: [place];  Service: Orthopedics;  Laterality: Right;  . SHOULDER ARTHROSCOPY WITH ROTATOR CUFF REPAIR AND OPEN BICEPS TENODESIS Left 01/22/2021   Procedure: left shoulder arthroscopy debridement, mini open rotator cuff tear repair, biceps tenodesis;  Surgeon: Meredith Pel, MD;  Location: [place];  Service: Orthopedics;  Laterality: Left;  NEEDS RNFA PLEASE  . TEE WITHOUT CARDIOVERSION N/A 06/23/2020   Procedure: TRANSESOPHAGEAL ECHOCARDIOGRAM (TEE);  Surgeon: Donato Heinz, MD;  Location: [facility] ENDOSCOPY;  Service: Cardiovascular;  Laterality: N/A;  . TONSILLECTOMY     as child  . TOTAL VAGINAL HYSTERECTOMY  5/07  . TUBAL LIGATION  1991   BILATERAL    There were no vitals filed for this visit.   Subjective Assessment - 03/21/21 1216    Subjective Pt. stated she was a little sore in arm after some movements.  No specific pain upon arrival today.    Limitations Lifting    Patient Stated Goals regain use of LUE  Currently in Pain? No/denies    Pain Score 0-No pain    Pain Onset More than a month ago              [facility] PT Assessment - 03/21/21 0001      Assessment   Medical Diagnosis Lt Mini open rotator cuff biceps tenodesis    Referring Provider (PT) Dr. Marlou Sa    Onset Date/Surgical Date 01/22/21    Hand Dominance Right    Next MD Visit 03/14/21    Prior Therapy at this clinic last year for Rt shoulder      AROM   Left Shoulder Flexion 130 Degrees   supine   Left Shoulder ABduction 106 Degrees   supine   Left Shoulder Internal Rotation --   supine 45 deg abduction   Left Shoulder External Rotation 70  Degrees   supine 45 deg abduction     PROM   Left Shoulder Flexion 135 Degrees   supine   Left Shoulder ABduction 110 Degrees   supine   Left Shoulder External Rotation 75 Degrees   in supine 45 deg abduction                        [facility] Adult PT Treatment/Exercise - 03/21/21 0001      Neuro Re-ed    Neuro Re-ed Details  rhythmic stabilizations in 100 deg flexion c mild resistance 20 second bouts, ER/IR in supine 45 deg abduction 20 seconds bouts c mild resistance      Shoulder Exercises: Supine   Horizontal ABduction Both;Theraband   3 x 10   Theraband Level (Shoulder Horizontal ABduction) Level 3 (Green)    Flexion Left   to fatigue 1 lb x20, 1 lb x 20   Shoulder Flexion Weight (lbs) 1      Shoulder Exercises: Sidelying   External Rotation Left   3 x 10   External Rotation Weight (lbs) 1    ABduction Left   2 x 10   ABduction Weight (lbs) 1      Shoulder Exercises: ROM/Strengthening   UBE (Upper Arm Bike) Lvl 3 3 mins fwd/back each way      Manual Therapy   Manual therapy comments g3 inferior jt mobs in flexion, scaption, abduction Lt shoulder. Posterior mobs in ER end range in 70 deg abduction.  Contract/relax for IR/ER end range progression                    PT Short Term Goals - 02/28/21 1159      PT SHORT TERM GOAL #1   Title Patient will demonstrate independent use of home exercise program to maintain progress from in clinic treatments.    Time 4    Period Weeks    Status On-going    Target Date 03/26/21             PT Long Term Goals - 03/12/21 [id number]      PT LONG TERM GOAL #1   Title Patient will demonstrate/report pain at worst less than or equal to 2/10 to facilitate minimal limitation in daily activity secondary to pain symptoms.    Time 12    Period Weeks    Status On-going    Target Date 05/21/21      PT LONG TERM GOAL #2   Title Patient will demonstrate independent use of home exercise program to facilitate ability to  maintain/progress functional gains from skilled physical therapy services.  Time 12    Status On-going    Target Date 05/21/21      PT LONG TERM GOAL #3   Title Patient will demonstrate return to work/recreational activity at previous level of function without limitations secondary due to condition    Time 12    Period Weeks    Status On-going    Target Date 05/21/21      PT LONG TERM GOAL #4   Title Pt. will demonstrate Lt GH joint AROM WFL s symptoms to facilitate usual dressing, self care, reaching at PLOF.    Time 12    Period Weeks    Status On-going    Target Date 05/21/21      PT LONG TERM GOAL #5   Title Pt. will demonstrate RUE MMT 5/5 throughout to facilitate usual lifting, carrying in household and work activity at [employer].    Time 12    Period Weeks    Status On-going    Target Date 05/21/21      PT LONG TERM GOAL #6   Title FOTO score improved to 68 for improved function    Time 12    Period Weeks    Status On-going    Target Date 05/21/21                 Plan - 03/21/21 1219    Clinical Impression Statement Pt. demonstrated progression in sidelying and supine active movement c progression to very light weight in movement c good technique.  After consultation with MD, possible use of BFR in future visits indicated.    Personal Factors and Comorbidities Comorbidity 3+    Comorbidities Rt RTC repair, fibromyalgia, HTN, HLD, CVA 05/2020 s/p tPA    Examination-Activity Limitations Bathing;Bed Mobility;Sleep;Carry;Lift;Hygiene/Grooming;Dressing;Reach Overhead    Examination-Participation Restrictions Cleaning;Community Activity;Driving;Laundry;Meal Prep;Occupation    Stability/Clinical Decision Making Evolving/Moderate complexity    Rehab Potential Good    PT Frequency 2x / week    PT Duration 12 weeks    PT Treatment/Interventions ADLs/Self Care Home Management;Cryotherapy;Electrical Stimulation;Moist Heat;Therapeutic exercise;Therapeutic  activities;Ultrasound;Neuromuscular re-education;Patient/family education;Manual techniques;Vasopneumatic Device;Taping;Dry needling;Passive range of motion    PT Next Visit Plan manual for end range mobility gains, AROM/RTC strengthening in supine/sidelying c progression to prone, sitting as appropriate with BFR use (40% range) possible    PT Home Exercise Plan Access Code: [email or web address]    Consulted and Agree with Plan of Care Patient           Patient will benefit from skilled therapeutic intervention in order to improve the following deficits and impairments:  Increased edema,Pain,Decreased strength,Decreased range of motion,Impaired flexibility,Postural dysfunction,Impaired UE functional use  Visit Diagnosis: Acute pain of left shoulder  Stiffness of left shoulder, not elsewhere classified  Abnormal posture  Muscle weakness (generalized)  Localized edema     Problem List Patient Active Problem List   Diagnosis Date Noted  . Traumatic complete tear of left rotator cuff   . Biceps tendonitis on left   . Essential hypertension 06/23/2020  . Prediabetes 06/23/2020  . Family hx-stroke 06/23/2020  . Restless legs syndrome (RLS) 06/23/2020  . Embolic cerebral infarction [facility]) s/p tPA, source unknown  06/21/2020  . Obesity (BMI 30.0-34.9) 09/16/2012  . Hyperlipidemia LDL goal <70 09/06/2008  . FIBROMYALGIA 09/06/2008  . HEADACHE, CHRONIC 09/06/2008  . FIBROIDS, UTERUS 09/01/2008  . Hypothyroidism 09/01/2008  . GERD 09/01/2008    Scot Jun, PT, DPT, OCS, ATC 03/21/21  12:23 PM    [facility] [address]  [place], [place], [address] Phone: [phone]   Fax:  [phone]  Name: Sahaana Weitman MRN: [id number] Date of Birth: [birth year]

## 2021-03-28 ENCOUNTER — Other Ambulatory Visit: Payer: Self-pay

## 2021-03-28 ENCOUNTER — Encounter: Payer: Self-pay | Admitting: Rehabilitative and Restorative Service Providers"

## 2021-03-28 ENCOUNTER — Ambulatory Visit: Payer: 59 | Admitting: Rehabilitative and Restorative Service Providers"

## 2021-03-28 DIAGNOSIS — R600 Localized edema: Secondary | ICD-10-CM | POA: Diagnosis not present

## 2021-03-28 DIAGNOSIS — M25512 Pain in left shoulder: Secondary | ICD-10-CM | POA: Diagnosis not present

## 2021-03-28 DIAGNOSIS — M25612 Stiffness of left shoulder, not elsewhere classified: Secondary | ICD-10-CM

## 2021-03-28 DIAGNOSIS — M6281 Muscle weakness (generalized): Secondary | ICD-10-CM | POA: Diagnosis not present

## 2021-03-28 DIAGNOSIS — Z23 Encounter for immunization: Secondary | ICD-10-CM | POA: Diagnosis not present

## 2021-03-28 DIAGNOSIS — R293 Abnormal posture: Secondary | ICD-10-CM

## 2021-03-28 NOTE — Therapy (Signed)
[facility] Physical Therapy [address] [place], [place], [address] Phone: [phone]   Fax:  [phone]  Physical Therapy Treatment  Patient Details  Name: [name] MRN: [id number] Date of Birth: May 27, 1961 Referring Provider (PT): Dr. Marlou Sa   Encounter Date: 03/28/2021   PT End of Session - 03/28/21 1113    Visit Number 8    Number of Visits 24    Date for PT Re-Evaluation 05/21/21    Authorization - Number of Visits 25    Progress Note Due on Visit 15    PT Start Time 1059    PT Stop Time 1138    PT Time Calculation (min) 39 min    Activity Tolerance Patient tolerated treatment well    Behavior During Therapy [facility] for tasks assessed/performed           Past Medical History:  Diagnosis Date  . Abnormal Pap smear of cervix   . Anemia 2007   due to fibroids  . Duodenitis without hemorrhage   . Esophageal stricture   . Esophagitis   . Fibromyalgia   . GERD (gastroesophageal reflux disease)   . Hiatal hernia   . History of loop recorder 06/23/2020   had CVA 06-21-20  . Hyperlipidemia   . Hypertension   . Hypothyroidism   . Leiomyoma of uterus, unspecified    fibroids  . Pneumonia    aspiration pneumonia after balloon surgery for weight loss  . Stroke ([place]) 06/21/2020   Right MCA infarct-recieved TPA    Past Surgical History:  Procedure Laterality Date  . ABDOMINAL HYSTERECTOMY    . BICEPT TENODESIS Right 12/07/2019   Procedure: BICEPS TENODESIS;  Surgeon: Meredith Pel, MD;  Location: [place];  Service: Orthopedics;  Laterality: Right;  . BUBBLE STUDY  06/23/2020   Procedure: BUBBLE STUDY;  Surgeon: Donato Heinz, MD;  Location: [facility] ENDOSCOPY;  Service: Cardiovascular;;  . CARPAL TUNNEL RELEASE  2014   right wrist  . CHOLECYSTECTOMY    . HYSTEROSCOPY W/ ENDOMETRIAL ABLATION  4/05  . LOOP RECORDER INSERTION N/A 06/23/2020   Procedure: LOOP RECORDER INSERTION;  Surgeon: Constance Haw, MD;   Location: [place] CV LAB;  Service: Cardiovascular;  Laterality: N/A;  . RECTOCELE REPAIR  2007  . SHOULDER ARTHROSCOPY WITH OPEN ROTATOR CUFF REPAIR Right 12/07/2019   Procedure: RIGHT SHOULDER ARTHROSCOPY,   MINI OPEN SUBSCAPULARIS, AND SUPRASPINATUS REPAIR, OPEN BICEPS TENODESIS;  Surgeon: Meredith Pel, MD;  Location: [place];  Service: Orthopedics;  Laterality: Right;  . SHOULDER ARTHROSCOPY WITH ROTATOR CUFF REPAIR AND OPEN BICEPS TENODESIS Left 01/22/2021   Procedure: left shoulder arthroscopy debridement, mini open rotator cuff tear repair, biceps tenodesis;  Surgeon: Meredith Pel, MD;  Location: [place];  Service: Orthopedics;  Laterality: Left;  NEEDS RNFA PLEASE  . TEE WITHOUT CARDIOVERSION N/A 06/23/2020   Procedure: TRANSESOPHAGEAL ECHOCARDIOGRAM (TEE);  Surgeon: Donato Heinz, MD;  Location: [facility] ENDOSCOPY;  Service: Cardiovascular;  Laterality: N/A;  . TONSILLECTOMY     as child  . TOTAL VAGINAL HYSTERECTOMY  5/07  . TUBAL LIGATION  1991   BILATERAL    There were no vitals filed for this visit.   Subjective Assessment - 03/28/21 1113    Subjective Pt. indicated no pain complaints.  Pt. stated she was able to do some work, has always had help if needed.    Limitations Lifting    Patient Stated Goals regain use of LUE  Currently in Pain? No/denies    Pain Score 0-No pain    Pain Onset More than a month ago                             [facility] Adult PT Treatment/Exercise - 03/28/21 0001      Shoulder Exercises: Supine   Horizontal ABduction Both;Theraband   2 x 10   Theraband Level (Shoulder Horizontal ABduction) Level 3 (Green)    Flexion Left   to fatigue x35   Shoulder Flexion Weight (lbs) 1    Other Supine Exercises supine D2 extension Lt arm green band 2 x 10      Shoulder Exercises: Prone   Other Prone Exercises prone y, t 2 x 10 Lt UE (movement within pain free range)      Shoulder  Exercises: Standing   Extension 20 reps;Both    Theraband Level (Shoulder Extension) Level 3 (Green)    Row Both;20 reps    Theraband Level (Shoulder Row) Level 3 (Green)    Other Standing Exercises ER at side with towel walk outs green band 5 sec hold x 15 Lt arm      Shoulder Exercises: ROM/Strengthening   UBE (Upper Arm Bike) Lvl 3.5 4 mins fwd/back each way with 15 second interval of increased RPM from :45 to :60 each minute    Proximal Shoulder Strengthening, Supine Lt 3 lbs; circles 2 x20 reps CW/CCW each in supine 90 degrees flexion                  PT Education - 03/28/21 1129    Education Details HEP techniques, new intervention techniques    Person(s) Educated Patient    Methods Explanation;Demonstration;Verbal cues;Handout    Comprehension Verbalized understanding;Returned demonstration            PT Short Term Goals - 03/28/21 1129      PT SHORT TERM GOAL #1   Title Patient will demonstrate independent use of home exercise program to maintain progress from in clinic treatments.    Time 4    Period Weeks    Status Achieved    Target Date 03/26/21             PT Long Term Goals - 03/12/21 1610      PT LONG TERM GOAL #1   Title Patient will demonstrate/report pain at worst less than or equal to 2/10 to facilitate minimal limitation in daily activity secondary to pain symptoms.    Time 12    Period Weeks    Status On-going    Target Date 05/21/21      PT LONG TERM GOAL #2   Title Patient will demonstrate independent use of home exercise program to facilitate ability to maintain/progress functional gains from skilled physical therapy services.    Time 12    Status On-going    Target Date 05/21/21      PT LONG TERM GOAL #3   Title Patient will demonstrate return to work/recreational activity at previous level of function without limitations secondary due to condition    Time 12    Period Weeks    Status On-going    Target Date 05/21/21      PT  LONG TERM GOAL #4   Title Pt. will demonstrate Lt GH joint AROM WFL s symptoms to facilitate usual dressing, self care, reaching at PLOF.    Time 12    Period Weeks  Status On-going    Target Date 05/21/21      PT LONG TERM GOAL #5   Title Pt. will demonstrate RUE MMT 5/5 throughout to facilitate usual lifting, carrying in household and work activity at [employer].    Time 12    Period Weeks    Status On-going    Target Date 05/21/21      PT LONG TERM GOAL #6   Title FOTO score improved to 68 for improved function    Time 12    Period Weeks    Status On-going    Target Date 05/21/21                 Plan - 03/28/21 1129    Clinical Impression Statement Progression of intervention today to include diagonal movement as well as scapular musculature strengthening c fair performance, fatigue noted in movements.  A few resistance band additions to HEP at this time.    Personal Factors and Comorbidities Comorbidity 3+    Comorbidities Rt RTC repair, fibromyalgia, HTN, HLD, CVA 05/2020 s/p tPA    Examination-Activity Limitations Bathing;Bed Mobility;Sleep;Carry;Lift;Hygiene/Grooming;Dressing;Reach Overhead    Examination-Participation Restrictions Cleaning;Community Activity;Driving;Laundry;Meal Prep;Occupation    Stability/Clinical Decision Making Evolving/Moderate complexity    Rehab Potential Good    PT Frequency 2x / week    PT Duration 12 weeks    PT Treatment/Interventions ADLs/Self Care Home Management;Cryotherapy;Electrical Stimulation;Moist Heat;Therapeutic exercise;Therapeutic activities;Ultrasound;Neuromuscular re-education;Patient/family education;Manual techniques;Vasopneumatic Device;Taping;Dry needling;Passive range of motion    PT Next Visit Plan manual for end range mobility gains, progression to prone y and t strengthening, sitting as appropriate with BFR use (40% range) is possible    PT Home Exercise Plan Access Code: [email or web address]    Consulted and Agree with Plan of Care  Patient           Patient will benefit from skilled therapeutic intervention in order to improve the following deficits and impairments:  Increased edema,Pain,Decreased strength,Decreased range of motion,Impaired flexibility,Postural dysfunction,Impaired UE functional use  Visit Diagnosis: Acute pain of left shoulder  Stiffness of left shoulder, not elsewhere classified  Abnormal posture  Muscle weakness (generalized)  Localized edema     Problem List Patient Active Problem List   Diagnosis Date Noted  . Traumatic complete tear of left rotator cuff   . Biceps tendonitis on left   . Essential hypertension 06/23/2020  . Prediabetes 06/23/2020  . Family hx-stroke 06/23/2020  . Restless legs syndrome (RLS) 06/23/2020  . Embolic cerebral infarction [facility]) s/p tPA, source unknown  06/21/2020  . Obesity (BMI 30.0-34.9) 09/16/2012  . Hyperlipidemia LDL goal <70 09/06/2008  . FIBROMYALGIA 09/06/2008  . HEADACHE, CHRONIC 09/06/2008  . FIBROIDS, UTERUS 09/01/2008  . Hypothyroidism 09/01/2008  . GERD 09/01/2008    Scot Jun, PT, DPT, OCS, ATC 03/28/21  11:35 AM    [facility] [address] [place], [place], [address] Phone: [phone]   Fax:  [phone]  Name: [name] MRN: [id number] Date of Birth: 03-Feb-1961

## 2021-03-28 NOTE — Patient Instructions (Signed)
Access Code: [email or web address] URL: [email or web address] Date: 03/28/2021 Prepared by: Scot Jun  Exercises Supine Shoulder Flexion Extension AAROM with Dowel - 3-5 x daily - 7 x weekly - 1-3 sets - 10 reps - 3-5 hold Supine Shoulder Abduction AAROM with Dowel - 3-5 x daily - 7 x weekly - 1-3 sets - 10 reps - 3-5 sec hold Supine Shoulder External Rotation AAROM with Dowel - 3-5 x daily - 7 x weekly - 1-3 sets - 10 reps - 3-5 sec hold Supine Shoulder Flexion Extension Full Range AROM - 1-2 x daily - 7 x weekly - 2 sets - 10 reps Sidelying Shoulder Abduction Palm Forward - 1-2 x daily - 7 x weekly - 2 sets - 10 reps Sidelying Shoulder External Rotation AROM - 1-2 x daily - 7 x weekly - 2 sets - 10 reps Supine Shoulder Horizontal Abduction with Resistance - 1 x daily - 7 x weekly - 3 sets - 10 reps Standing Shoulder Row with Anchored Resistance - 1 x daily - 7 x weekly - 3 sets - 10 reps Shoulder Extension with Resistance - 1 x daily - 7 x weekly - 3 sets - 10 reps

## 2021-04-02 MED FILL — Losartan Potassium Tab 100 MG: ORAL | 30 days supply | Qty: 30 | Fill #1 | Status: AC

## 2021-04-03 ENCOUNTER — Other Ambulatory Visit (HOSPITAL_COMMUNITY): Payer: Self-pay

## 2021-04-04 ENCOUNTER — Other Ambulatory Visit: Payer: Self-pay

## 2021-04-04 ENCOUNTER — Telehealth: Payer: Self-pay | Admitting: Rehabilitative and Restorative Service Providers"

## 2021-04-04 ENCOUNTER — Encounter: Payer: Self-pay | Admitting: Rehabilitative and Restorative Service Providers"

## 2021-04-04 ENCOUNTER — Ambulatory Visit: Payer: 59 | Admitting: Rehabilitative and Restorative Service Providers"

## 2021-04-04 DIAGNOSIS — R293 Abnormal posture: Secondary | ICD-10-CM | POA: Diagnosis not present

## 2021-04-04 DIAGNOSIS — M6281 Muscle weakness (generalized): Secondary | ICD-10-CM

## 2021-04-04 DIAGNOSIS — R600 Localized edema: Secondary | ICD-10-CM | POA: Diagnosis not present

## 2021-04-04 DIAGNOSIS — M25512 Pain in left shoulder: Secondary | ICD-10-CM

## 2021-04-04 DIAGNOSIS — M25612 Stiffness of left shoulder, not elsewhere classified: Secondary | ICD-10-CM

## 2021-04-04 NOTE — Telephone Encounter (Signed)
Called pt. After missed appointment time.  Rescheduled for afternoon today.   Scot Jun, PT, DPT, OCS, ATC 04/04/21  1:06 PM

## 2021-04-04 NOTE — Therapy (Addendum)
[facility] [address] [place], [place], [address] Phone: [phone]   Fax:  [phone]  Physical Therapy Treatment  Patient Details  Name: [name] MRN: [id number] Date of Birth: 1960-12-13 Referring Provider (PT): Dr. Marlou Sa   Encounter Date: 04/04/2021   PT End of Session - 04/04/21 1304    Visit Number 9    Number of Visits 24    Date for PT Re-Evaluation 05/21/21    Authorization - Number of Visits 25    Progress Note Due on Visit 15    PT Start Time 2376    PT Stop Time 1338    PT Time Calculation (min) 40 min    Activity Tolerance Patient tolerated treatment well    Behavior During Therapy [facility] for tasks assessed/performed           Past Medical History:  Diagnosis Date  . Abnormal Pap smear of cervix   . Anemia 2007   due to fibroids  . Duodenitis without hemorrhage   . Esophageal stricture   . Esophagitis   . Fibromyalgia   . GERD (gastroesophageal reflux disease)   . Hiatal hernia   . History of loop recorder 06/23/2020   had CVA 06-21-20  . Hyperlipidemia   . Hypertension   . Hypothyroidism   . Leiomyoma of uterus, unspecified    fibroids  . Pneumonia    aspiration pneumonia after balloon surgery for weight loss  . Stroke ([place]) 06/21/2020   Right MCA infarct-recieved TPA    Past Surgical History:  Procedure Laterality Date  . ABDOMINAL HYSTERECTOMY    . BICEPT TENODESIS Right 12/07/2019   Procedure: BICEPS TENODESIS;  Surgeon: Meredith Pel, MD;  Location: [place];  Service: Orthopedics;  Laterality: Right;  . BUBBLE STUDY  06/23/2020   Procedure: BUBBLE STUDY;  Surgeon: Donato Heinz, MD;  Location: [facility] ENDOSCOPY;  Service: Cardiovascular;;  . CARPAL TUNNEL RELEASE  2014   right wrist  . CHOLECYSTECTOMY    . HYSTEROSCOPY W/ ENDOMETRIAL ABLATION  4/05  . LOOP RECORDER INSERTION N/A 06/23/2020   Procedure: LOOP RECORDER INSERTION;  Surgeon: Constance Haw, MD;   Location: [place] CV LAB;  Service: Cardiovascular;  Laterality: N/A;  . RECTOCELE REPAIR  2007  . SHOULDER ARTHROSCOPY WITH OPEN ROTATOR CUFF REPAIR Right 12/07/2019   Procedure: RIGHT SHOULDER ARTHROSCOPY,   MINI OPEN SUBSCAPULARIS, AND SUPRASPINATUS REPAIR, OPEN BICEPS TENODESIS;  Surgeon: Meredith Pel, MD;  Location: [place];  Service: Orthopedics;  Laterality: Right;  . SHOULDER ARTHROSCOPY WITH ROTATOR CUFF REPAIR AND OPEN BICEPS TENODESIS Left 01/22/2021   Procedure: left shoulder arthroscopy debridement, mini open rotator cuff tear repair, biceps tenodesis;  Surgeon: Meredith Pel, MD;  Location: [place];  Service: Orthopedics;  Laterality: Left;  NEEDS RNFA PLEASE  . TEE WITHOUT CARDIOVERSION N/A 06/23/2020   Procedure: TRANSESOPHAGEAL ECHOCARDIOGRAM (TEE);  Surgeon: Donato Heinz, MD;  Location: [facility] ENDOSCOPY;  Service: Cardiovascular;  Laterality: N/A;  . TONSILLECTOMY     as child  . TOTAL VAGINAL HYSTERECTOMY  5/07  . TUBAL LIGATION  1991   BILATERAL    There were no vitals filed for this visit.   Subjective Assessment - 04/04/21 1301    Subjective Pt. indicated no complaints of pain at this time upon arrival.  Pt. stated she just forgot the appointment this morning due to work being busy.    Limitations Lifting    Patient Stated Goals  regain use of LUE    Currently in Pain? No/denies    Pain Score 0-No pain    Pain Onset More than a month ago              [facility] PT Assessment - 04/04/21 0001      Assessment   Medical Diagnosis Lt Mini open rotator cuff biceps tenodesis    Referring Provider (PT) Dr. Marlou Sa    Onset Date/Surgical Date 01/22/21    Hand Dominance Right    Next MD Visit 03/14/21      Strength   Overall Strength Comments Hand held dynamometry in standing sitting MMT positioning    Left Shoulder Flexion --   4.1, 4.1 lbs   Left Shoulder External Rotation --   8.8, 9.5 lbs                         [facility] Adult PT Treatment/Exercise - 04/04/21 0001      Neuro Re-ed    Neuro Re-ed Details  rhythmic stabilizations in 100 deg flexion c mild resistance 20 second bouts, ER/IR in supine 45 deg abduction 20 seconds bouts c mild resistance      Shoulder Exercises: Supine   Horizontal ABduction Both;Theraband   2 x 10   Theraband Level (Shoulder Horizontal ABduction) Level 3 (Green)    Flexion Left   to fatigue x30   Shoulder Flexion Weight (lbs) 1    Other Supine Exercises supine D2 extension Lt arm green band 2 x 10      Shoulder Exercises: Prone   Other Prone Exercises --      Shoulder Exercises: Standing   External Rotation Left   Green eccentric control only 3x 10, arm at side with towel roll   Theraband Level (Shoulder External Rotation) Level 3 (Green)    Internal Rotation Left   3 x 10 arm at side with towel roll   Theraband Level (Shoulder Internal Rotation) Level 3 (Green)    Other Standing Exercises standing W pulls red band 2 x 10 c 2 second holds      Shoulder Exercises: ROM/Strengthening   UBE (Upper Arm Bike) Lvl 3.5 4 mins fwd/back each way with 15 second interval of increased RPM from :45 to :60 each minute      Shoulder Exercises: Stretch   Other Shoulder Stretches supine cross arm stretch 15 sec x 3                    PT Short Term Goals - 03/28/21 1129      PT SHORT TERM GOAL #1   Title Patient will demonstrate independent use of home exercise program to maintain progress from in clinic treatments.    Time 4    Period Weeks    Status Achieved    Target Date 03/26/21              04/25/21 1125  PT LONG TERM GOAL #1  Title Patient will demonstrate/report pain at worst less than or equal to 2/10 to facilitate minimal limitation in daily activity secondary to pain symptoms.  Time 12  Period Weeks  Status On-going  Target Date 05/21/21  PT LONG TERM GOAL #2  Title Patient will demonstrate independent use of home  exercise program to facilitate ability to maintain/progress functional gains from skilled physical therapy services.  Time 12  Status Achieved  Target Date 05/21/21  PT LONG TERM GOAL #3  Title Patient  will demonstrate return to work/recreational activity at previous level of function without limitations secondary due to condition  Time 12  Period Weeks  Status On-going  Target Date 05/21/21  PT LONG TERM GOAL #4  Title Pt. will demonstrate Lt GH joint AROM WFL s symptoms to facilitate usual dressing, self care, reaching at PLOF.  Time 12  Period Weeks  Status On-going  Target Date 05/21/21  PT LONG TERM GOAL #5  Title Pt. will demonstrate RUE MMT 5/5 throughout to facilitate usual lifting, carrying in household and work activity at [employer].  Time 12  Period Weeks  Status On-going  Target Date 05/21/21  PT LONG TERM GOAL #6  Title FOTO score improved to 68 for improved function  Time 12  Period Weeks  Status On-going  Target Date 05/21/21          Plan - 04/04/21 1333    Clinical Impression Statement Fatigue noted in intervention as resistance has increased in last few visits on active range movements.  Dynamometry readings performed today for flexion, er on Lt glenohumeral joint as documented.    Personal Factors and Comorbidities Comorbidity 3+    Comorbidities Rt RTC repair, fibromyalgia, HTN, HLD, CVA 05/2020 s/p tPA    Examination-Activity Limitations Bathing;Bed Mobility;Sleep;Carry;Lift;Hygiene/Grooming;Dressing;Reach Overhead    Examination-Participation Restrictions Cleaning;Community Activity;Driving;Laundry;Meal Prep;Occupation    Stability/Clinical Decision Making Evolving/Moderate complexity    Rehab Potential Good    PT Frequency 2x / week    PT Duration 12 weeks    PT Treatment/Interventions ADLs/Self Care Home Management;Cryotherapy;Electrical Stimulation;Moist Heat;Therapeutic exercise;Therapeutic activities;Ultrasound;Neuromuscular  re-education;Patient/family education;Manual techniques;Vasopneumatic Device;Taping;Dry needling;Passive range of motion    PT Next Visit Plan manual for end range mobility gains, progression to prone y and t strengthening, sitting as appropriate with BFR use (40% range) is possible    PT Home Exercise Plan Access Code: [email or web address]    Consulted and Agree with Plan of Care Patient           Patient will benefit from skilled therapeutic intervention in order to improve the following deficits and impairments:  Increased edema,Pain,Decreased strength,Decreased range of motion,Impaired flexibility,Postural dysfunction,Impaired UE functional use  Visit Diagnosis: Acute pain of left shoulder  Stiffness of left shoulder, not elsewhere classified  Abnormal posture  Muscle weakness (generalized)  Localized edema     Problem List Patient Active Problem List   Diagnosis Date Noted  . Traumatic complete tear of left rotator cuff   . Biceps tendonitis on left   . Essential hypertension 06/23/2020  . Prediabetes 06/23/2020  . Family hx-stroke 06/23/2020  . Restless legs syndrome (RLS) 06/23/2020  . Embolic cerebral infarction [facility]) s/p tPA, source unknown  06/21/2020  . Obesity (BMI 30.0-34.9) 09/16/2012  . Hyperlipidemia LDL goal <70 09/06/2008  . FIBROMYALGIA 09/06/2008  . HEADACHE, CHRONIC 09/06/2008  . FIBROIDS, UTERUS 09/01/2008  . Hypothyroidism 09/01/2008  . GERD 09/01/2008    Scot Jun, PT, DPT, OCS, ATC 04/04/21  1:38 PM    [facility] [address] [place], [place], [address] Phone: [phone]   Fax:  [phone]  Name: [name] MRN: [id number] Date of Birth: 11-May-1961

## 2021-04-11 ENCOUNTER — Other Ambulatory Visit: Payer: Self-pay

## 2021-04-11 ENCOUNTER — Ambulatory Visit (INDEPENDENT_AMBULATORY_CARE_PROVIDER_SITE_OTHER): Payer: 59 | Admitting: Rehabilitative and Restorative Service Providers"

## 2021-04-11 ENCOUNTER — Other Ambulatory Visit (HOSPITAL_COMMUNITY): Payer: Self-pay

## 2021-04-11 ENCOUNTER — Encounter: Payer: Self-pay | Admitting: Rehabilitative and Restorative Service Providers"

## 2021-04-11 DIAGNOSIS — M25512 Pain in left shoulder: Secondary | ICD-10-CM

## 2021-04-11 DIAGNOSIS — R293 Abnormal posture: Secondary | ICD-10-CM

## 2021-04-11 DIAGNOSIS — R600 Localized edema: Secondary | ICD-10-CM

## 2021-04-11 DIAGNOSIS — M6281 Muscle weakness (generalized): Secondary | ICD-10-CM

## 2021-04-11 DIAGNOSIS — M25612 Stiffness of left shoulder, not elsewhere classified: Secondary | ICD-10-CM | POA: Diagnosis not present

## 2021-04-11 MED FILL — Duloxetine HCl Enteric Coated Pellets Cap 30 MG (Base Eq): ORAL | 90 days supply | Qty: 90 | Fill #0 | Status: AC

## 2021-04-11 MED FILL — Rosuvastatin Calcium Tab 10 MG: ORAL | 90 days supply | Qty: 90 | Fill #0 | Status: AC

## 2021-04-11 MED FILL — Erenumab-aooe Subcutaneous Soln Auto-Injector 140 MG/ML: SUBCUTANEOUS | 28 days supply | Qty: 1 | Fill #1 | Status: AC

## 2021-04-11 NOTE — Therapy (Signed)
[facility] [address] [place], [place], [address] Phone: [phone]   Fax:  [phone]  Physical Therapy Treatment  Patient Details  Name: [name] MRN: [id number] Date of Birth: 01/09/1961 Referring Provider (PT): Dr. Marlou Sa   Encounter Date: 04/11/2021   PT End of Session - 04/11/21 1103    Visit Number 10    Number of Visits 24    Date for PT Re-Evaluation 05/21/21    Authorization - Number of Visits 25    Progress Note Due on Visit 15    PT Start Time 1100    PT Stop Time 1139    PT Time Calculation (min) 39 min    Activity Tolerance Patient tolerated treatment well    Behavior During Therapy [facility] for tasks assessed/performed           Past Medical History:  Diagnosis Date  . Abnormal Pap smear of cervix   . Anemia 2007   due to fibroids  . Duodenitis without hemorrhage   . Esophageal stricture   . Esophagitis   . Fibromyalgia   . GERD (gastroesophageal reflux disease)   . Hiatal hernia   . History of loop recorder 06/23/2020   had CVA 06-21-20  . Hyperlipidemia   . Hypertension   . Hypothyroidism   . Leiomyoma of uterus, unspecified    fibroids  . Pneumonia    aspiration pneumonia after balloon surgery for weight loss  . Stroke ([place]) 06/21/2020   Right MCA infarct-recieved TPA    Past Surgical History:  Procedure Laterality Date  . ABDOMINAL HYSTERECTOMY    . BICEPT TENODESIS Right 12/07/2019   Procedure: BICEPS TENODESIS;  Surgeon: Meredith Pel, MD;  Location: [place];  Service: Orthopedics;  Laterality: Right;  . BUBBLE STUDY  06/23/2020   Procedure: BUBBLE STUDY;  Surgeon: Donato Heinz, MD;  Location: [facility] ENDOSCOPY;  Service: Cardiovascular;;  . CARPAL TUNNEL RELEASE  2014   right wrist  . CHOLECYSTECTOMY    . HYSTEROSCOPY W/ ENDOMETRIAL ABLATION  4/05  . LOOP RECORDER INSERTION N/A 06/23/2020   Procedure: LOOP RECORDER INSERTION;  Surgeon: Constance Haw, MD;   Location: [place] CV LAB;  Service: Cardiovascular;  Laterality: N/A;  . RECTOCELE REPAIR  2007  . SHOULDER ARTHROSCOPY WITH OPEN ROTATOR CUFF REPAIR Right 12/07/2019   Procedure: RIGHT SHOULDER ARTHROSCOPY,   MINI OPEN SUBSCAPULARIS, AND SUPRASPINATUS REPAIR, OPEN BICEPS TENODESIS;  Surgeon: Meredith Pel, MD;  Location: [place];  Service: Orthopedics;  Laterality: Right;  . SHOULDER ARTHROSCOPY WITH ROTATOR CUFF REPAIR AND OPEN BICEPS TENODESIS Left 01/22/2021   Procedure: left shoulder arthroscopy debridement, mini open rotator cuff tear repair, biceps tenodesis;  Surgeon: Meredith Pel, MD;  Location: [place];  Service: Orthopedics;  Laterality: Left;  NEEDS RNFA PLEASE  . TEE WITHOUT CARDIOVERSION N/A 06/23/2020   Procedure: TRANSESOPHAGEAL ECHOCARDIOGRAM (TEE);  Surgeon: Donato Heinz, MD;  Location: [facility] ENDOSCOPY;  Service: Cardiovascular;  Laterality: N/A;  . TONSILLECTOMY     as child  . TOTAL VAGINAL HYSTERECTOMY  5/07  . TUBAL LIGATION  1991   BILATERAL    There were no vitals filed for this visit.   Subjective Assessment - 04/11/21 1103    Subjective Pt. indicated no specific pain today.  Pt. stated able to lie on that arm at night sometimes.    Limitations Lifting    Patient Stated Goals regain use of LUE    Currently  in Pain? No/denies    Pain Score 0-No pain    Pain Onset More than a month ago                             [facility] Adult PT Treatment/Exercise - 04/11/21 0001      Shoulder Exercises: Prone   Other Prone Exercises prone y, t 2 x 10 Lt UE (movement within pain free range)      Shoulder Exercises: Standing   External Rotation Left   eccentric control only 3 x 10 c towel at side   Theraband Level (Shoulder External Rotation) Level 3 (Green)    Internal Rotation Left   3 x 15 c towel at side   Theraband Level (Shoulder Internal Rotation) Level 3 (Green)    Flexion Left   1 lb x 10, 2  x 15 0 lbs 0-90 degrees   ABduction Left   2 x 15 0-90 degrees   Row Both;20 reps;Theraband    Theraband Level (Shoulder Row) Level 4 (Blue)      Shoulder Exercises: ROM/Strengthening   UBE (Upper Arm Bike) Lvl 3.5 4 mins fwd/back each way with 15 second interval of increased RPM from :45 to :60 each minute      Shoulder Exercises: Stretch   Other Shoulder Stretches standing 1 lb bar slide up back for HBB movement, bilaterally x 15 c 2-3 second hold    Other Shoulder Stretches IR rope stretch behind back c cues for prevent trunk flexion 30 sec x 3 Lt                    PT Short Term Goals - 03/28/21 1129      PT SHORT TERM GOAL #1   Title Patient will demonstrate independent use of home exercise program to maintain progress from in clinic treatments.    Time 4    Period Weeks    Status Achieved    Target Date 03/26/21             PT Long Term Goals - 03/12/21 3244      PT LONG TERM GOAL #1   Title Patient will demonstrate/report pain at worst less than or equal to 2/10 to facilitate minimal limitation in daily activity secondary to pain symptoms.    Time 12    Period Weeks    Status On-going    Target Date 05/21/21      PT LONG TERM GOAL #2   Title Patient will demonstrate independent use of home exercise program to facilitate ability to maintain/progress functional gains from skilled physical therapy services.    Time 12    Status On-going    Target Date 05/21/21      PT LONG TERM GOAL #3   Title Patient will demonstrate return to work/recreational activity at previous level of function without limitations secondary due to condition    Time 12    Period Weeks    Status On-going    Target Date 05/21/21      PT LONG TERM GOAL #4   Title Pt. will demonstrate Lt GH joint AROM WFL s symptoms to facilitate usual dressing, self care, reaching at PLOF.    Time 12    Period Weeks    Status On-going    Target Date 05/21/21      PT LONG TERM GOAL #5   Title  Pt. will demonstrate RUE MMT 5/5 throughout  to facilitate usual lifting, carrying in household and work activity at [employer].    Time 12    Period Weeks    Status On-going    Target Date 05/21/21      PT LONG TERM GOAL #6   Title FOTO score improved to 68 for improved function    Time 12    Period Weeks    Status On-going    Target Date 05/21/21                 Plan - 04/11/21 1116    Clinical Impression Statement Able to progress to standing elevation attempts today and added to HEP c good control overall to shoulder height c no shrug noted.  Fatigue was present but expected being first day to complete task.    Personal Factors and Comorbidities Comorbidity 3+    Comorbidities Rt RTC repair, fibromyalgia, HTN, HLD, CVA 05/2020 s/p tPA    Examination-Activity Limitations Bathing;Bed Mobility;Sleep;Carry;Lift;Hygiene/Grooming;Dressing;Reach Overhead    Examination-Participation Restrictions Cleaning;Community Activity;Driving;Laundry;Meal Prep;Occupation    Stability/Clinical Decision Making Evolving/Moderate complexity    Rehab Potential Good    PT Frequency 2x / week    PT Duration 12 weeks    PT Treatment/Interventions ADLs/Self Care Home Management;Cryotherapy;Electrical Stimulation;Moist Heat;Therapeutic exercise;Therapeutic activities;Ultrasound;Neuromuscular re-education;Patient/family education;Manual techniques;Vasopneumatic Device;Taping;Dry needling;Passive range of motion    PT Next Visit Plan Continue transitioning of strengthening in elevation from lying to standing.    PT Home Exercise Plan Access Code: [email or web address]    Consulted and Agree with Plan of Care Patient           Patient will benefit from skilled therapeutic intervention in order to improve the following deficits and impairments:  Increased edema,Pain,Decreased strength,Decreased range of motion,Impaired flexibility,Postural dysfunction,Impaired UE functional use  Visit Diagnosis: Acute pain of left  shoulder  Stiffness of left shoulder, not elsewhere classified  Abnormal posture  Muscle weakness (generalized)  Localized edema     Problem List Patient Active Problem List   Diagnosis Date Noted  . Traumatic complete tear of left rotator cuff   . Biceps tendonitis on left   . Essential hypertension 06/23/2020  . Prediabetes 06/23/2020  . Family hx-stroke 06/23/2020  . Restless legs syndrome (RLS) 06/23/2020  . Embolic cerebral infarction [facility]) s/p tPA, source unknown  06/21/2020  . Obesity (BMI 30.0-34.9) 09/16/2012  . Hyperlipidemia LDL goal <70 09/06/2008  . FIBROMYALGIA 09/06/2008  . HEADACHE, CHRONIC 09/06/2008  . FIBROIDS, UTERUS 09/01/2008  . Hypothyroidism 09/01/2008  . GERD 09/01/2008    Scot Jun, PT, DPT, OCS, ATC 04/11/21  11:35 AM    [facility] [address] [place], [place], [address] Phone: [phone]   Fax:  [phone]  Name: [name] MRN: [id number] Date of Birth: 1961-02-18

## 2021-04-12 DIAGNOSIS — Z760 Encounter for issue of repeat prescription: Secondary | ICD-10-CM | POA: Diagnosis not present

## 2021-04-17 ENCOUNTER — Other Ambulatory Visit (HOSPITAL_COMMUNITY): Payer: Self-pay | Admitting: Internal Medicine

## 2021-04-17 ENCOUNTER — Ambulatory Visit (INDEPENDENT_AMBULATORY_CARE_PROVIDER_SITE_OTHER): Payer: 59

## 2021-04-17 DIAGNOSIS — I495 Sick sinus syndrome: Secondary | ICD-10-CM | POA: Diagnosis not present

## 2021-04-18 ENCOUNTER — Other Ambulatory Visit (HOSPITAL_COMMUNITY): Payer: Self-pay | Admitting: Internal Medicine

## 2021-04-18 ENCOUNTER — Other Ambulatory Visit (HOSPITAL_COMMUNITY): Payer: Self-pay

## 2021-04-18 ENCOUNTER — Other Ambulatory Visit: Payer: Self-pay

## 2021-04-18 ENCOUNTER — Ambulatory Visit (INDEPENDENT_AMBULATORY_CARE_PROVIDER_SITE_OTHER): Payer: 59 | Admitting: Rehabilitative and Restorative Service Providers"

## 2021-04-18 ENCOUNTER — Encounter: Payer: Self-pay | Admitting: Rehabilitative and Restorative Service Providers"

## 2021-04-18 DIAGNOSIS — M6281 Muscle weakness (generalized): Secondary | ICD-10-CM

## 2021-04-18 DIAGNOSIS — R293 Abnormal posture: Secondary | ICD-10-CM

## 2021-04-18 DIAGNOSIS — R600 Localized edema: Secondary | ICD-10-CM

## 2021-04-18 DIAGNOSIS — M25612 Stiffness of left shoulder, not elsewhere classified: Secondary | ICD-10-CM | POA: Diagnosis not present

## 2021-04-18 DIAGNOSIS — M25512 Pain in left shoulder: Secondary | ICD-10-CM

## 2021-04-18 LAB — CUP PACEART REMOTE DEVICE CHECK
Date Time Interrogation Session: 20220520000645
Implantable Pulse Generator Implant Date: 20210730

## 2021-04-18 NOTE — Therapy (Signed)
[facility] [address] [place], [place], [address] Phone: [phone]   Fax:  [phone]  Physical Therapy Treatment  Patient Details  Name: [name] MRN: [id number] Date of Birth: 1961-08-05 Referring Provider (PT): Dr. Marlou Sa   Encounter Date: 04/18/2021   PT End of Session - 04/18/21 1059    Visit Number 11    Number of Visits 24    Date for PT Re-Evaluation 05/21/21    Authorization - Number of Visits 25    Progress Note Due on Visit 15    PT Start Time 1058    PT Stop Time 1138    PT Time Calculation (min) 40 min    Activity Tolerance Patient tolerated treatment well    Behavior During Therapy [facility] for tasks assessed/performed           Past Medical History:  Diagnosis Date  . Abnormal Pap smear of cervix   . Anemia 2007   due to fibroids  . Duodenitis without hemorrhage   . Esophageal stricture   . Esophagitis   . Fibromyalgia   . GERD (gastroesophageal reflux disease)   . Hiatal hernia   . History of loop recorder 06/23/2020   had CVA 06-21-20  . Hyperlipidemia   . Hypertension   . Hypothyroidism   . Leiomyoma of uterus, unspecified    fibroids  . Pneumonia    aspiration pneumonia after balloon surgery for weight loss  . Stroke ([place]) 06/21/2020   Right MCA infarct-recieved TPA    Past Surgical History:  Procedure Laterality Date  . ABDOMINAL HYSTERECTOMY    . BICEPT TENODESIS Right 12/07/2019   Procedure: BICEPS TENODESIS;  Surgeon: Meredith Pel, MD;  Location: [place];  Service: Orthopedics;  Laterality: Right;  . BUBBLE STUDY  06/23/2020   Procedure: BUBBLE STUDY;  Surgeon: Donato Heinz, MD;  Location: [facility] ENDOSCOPY;  Service: Cardiovascular;;  . CARPAL TUNNEL RELEASE  2014   right wrist  . CHOLECYSTECTOMY    . HYSTEROSCOPY W/ ENDOMETRIAL ABLATION  4/05  . LOOP RECORDER INSERTION N/A 06/23/2020   Procedure: LOOP RECORDER INSERTION;  Surgeon: Constance Haw, MD;   Location: [place] CV LAB;  Service: Cardiovascular;  Laterality: N/A;  . RECTOCELE REPAIR  2007  . SHOULDER ARTHROSCOPY WITH OPEN ROTATOR CUFF REPAIR Right 12/07/2019   Procedure: RIGHT SHOULDER ARTHROSCOPY,   MINI OPEN SUBSCAPULARIS, AND SUPRASPINATUS REPAIR, OPEN BICEPS TENODESIS;  Surgeon: Meredith Pel, MD;  Location: [place];  Service: Orthopedics;  Laterality: Right;  . SHOULDER ARTHROSCOPY WITH ROTATOR CUFF REPAIR AND OPEN BICEPS TENODESIS Left 01/22/2021   Procedure: left shoulder arthroscopy debridement, mini open rotator cuff tear repair, biceps tenodesis;  Surgeon: Meredith Pel, MD;  Location: [place];  Service: Orthopedics;  Laterality: Left;  NEEDS RNFA PLEASE  . TEE WITHOUT CARDIOVERSION N/A 06/23/2020   Procedure: TRANSESOPHAGEAL ECHOCARDIOGRAM (TEE);  Surgeon: Donato Heinz, MD;  Location: [facility] ENDOSCOPY;  Service: Cardiovascular;  Laterality: N/A;  . TONSILLECTOMY     as child  . TOTAL VAGINAL HYSTERECTOMY  5/07  . TUBAL LIGATION  1991   BILATERAL    There were no vitals filed for this visit.   Subjective Assessment - 04/18/21 1102    Subjective No pain indicated today upon arrival with no other specific things reported about progress today.  Similar feeling as last viist.    Limitations Lifting    Patient Stated Goals regain use of LUE  Currently in Pain? No/denies    Pain Score 0-No pain    Pain Onset More than a month ago              [facility] PT Assessment - 04/18/21 0001      Assessment   Medical Diagnosis Lt Mini open rotator cuff biceps tenodesis    Referring Provider (PT) Dr. Marlou Sa    Onset Date/Surgical Date 01/22/21    Hand Dominance Right      AROM   Left Shoulder Flexion 140 Degrees   in supine   Left Shoulder ABduction 130 Degrees   in supine   Left Shoulder Internal Rotation 70 Degrees   in supine 60 deg abduction   Left Shoulder External Rotation 75 Degrees   in supine 60 deg abduction                         [facility] Adult PT Treatment/Exercise - 04/18/21 0001      Shoulder Exercises: Supine   Other Supine Exercises supine D2 extension Lt arm green band 3 x 10      Shoulder Exercises: Standing   External Rotation Left   3 x 10 eccentric control only blue band   Theraband Level (Shoulder External Rotation) Level 4 (Blue)    Internal Rotation Left   3 x 10 c towel at side   Theraband Level (Shoulder Internal Rotation) Level 4 (Blue)    Other Standing Exercises ball circles (yellow ball) at 100 degrees flexion cw, ccw 30 x 2 bilateral    Other Standing Exercises wall push up c SA press 2 x 10      Shoulder Exercises: ROM/Strengthening   UBE (Upper Arm Bike) Lvl 4 4 mins fwd/back each way with 15 seconds interval quicker at :45 -:60 each minute      Shoulder Exercises: Stretch   Other Shoulder Stretches IR rope stretch behind back c cues for prevent trunk flexion 30 sec x 3 Lt                    PT Short Term Goals - 03/28/21 1129      PT SHORT TERM GOAL #1   Title Patient will demonstrate independent use of home exercise program to maintain progress from in clinic treatments.    Time 4    Period Weeks    Status Achieved    Target Date 03/26/21             PT Long Term Goals - 03/12/21 5916      PT LONG TERM GOAL #1   Title Patient will demonstrate/report pain at worst less than or equal to 2/10 to facilitate minimal limitation in daily activity secondary to pain symptoms.    Time 12    Period Weeks    Status On-going    Target Date 05/21/21      PT LONG TERM GOAL #2   Title Patient will demonstrate independent use of home exercise program to facilitate ability to maintain/progress functional gains from skilled physical therapy services.    Time 12    Status On-going    Target Date 05/21/21      PT LONG TERM GOAL #3   Title Patient will demonstrate return to work/recreational activity at previous level of function without  limitations secondary due to condition    Time 12    Period Weeks    Status On-going    Target Date 05/21/21  PT LONG TERM GOAL #4   Title Pt. will demonstrate Lt GH joint AROM WFL s symptoms to facilitate usual dressing, self care, reaching at PLOF.    Time 12    Period Weeks    Status On-going    Target Date 05/21/21      PT LONG TERM GOAL #5   Title Pt. will demonstrate RUE MMT 5/5 throughout to facilitate usual lifting, carrying in household and work activity at [employer].    Time 12    Period Weeks    Status On-going    Target Date 05/21/21      PT LONG TERM GOAL #6   Title FOTO score improved to 68 for improved function    Time 12    Period Weeks    Status On-going    Target Date 05/21/21                 Plan - 04/18/21 1126    Clinical Impression Statement Measured active mobility continued to show improvement at tihs time in Lt shoulder.  Pt. to benefit from continued strengthening program for shoulder and scapular region to improve functional lifting strength for daily activity.    Personal Factors and Comorbidities Comorbidity 3+    Comorbidities Rt RTC repair, fibromyalgia, HTN, HLD, CVA 05/2020 s/p tPA    Examination-Activity Limitations Bathing;Bed Mobility;Sleep;Carry;Lift;Hygiene/Grooming;Dressing;Reach Overhead    Examination-Participation Restrictions Cleaning;Community Activity;Driving;Laundry;Meal Prep;Occupation    Stability/Clinical Decision Making Evolving/Moderate complexity    Rehab Potential Good    PT Frequency 2x / week    PT Duration 12 weeks    PT Treatment/Interventions ADLs/Self Care Home Management;Cryotherapy;Electrical Stimulation;Moist Heat;Therapeutic exercise;Therapeutic activities;Ultrasound;Neuromuscular re-education;Patient/family education;Manual techniques;Vasopneumatic Device;Taping;Dry needling;Passive range of motion    PT Next Visit Plan Continued progression of progressive resistive exercise to tolerance.    PT Home Exercise  Plan Access Code: [email or web address]    Consulted and Agree with Plan of Care Patient           Patient will benefit from skilled therapeutic intervention in order to improve the following deficits and impairments:  Increased edema,Pain,Decreased strength,Decreased range of motion,Impaired flexibility,Postural dysfunction,Impaired UE functional use  Visit Diagnosis: Acute pain of left shoulder  Stiffness of left shoulder, not elsewhere classified  Abnormal posture  Muscle weakness (generalized)  Localized edema     Problem List Patient Active Problem List   Diagnosis Date Noted  . Traumatic complete tear of left rotator cuff   . Biceps tendonitis on left   . Essential hypertension 06/23/2020  . Prediabetes 06/23/2020  . Family hx-stroke 06/23/2020  . Restless legs syndrome (RLS) 06/23/2020  . Embolic cerebral infarction [facility]) s/p tPA, source unknown  06/21/2020  . Obesity (BMI 30.0-34.9) 09/16/2012  . Hyperlipidemia LDL goal <70 09/06/2008  . FIBROMYALGIA 09/06/2008  . HEADACHE, CHRONIC 09/06/2008  . FIBROIDS, UTERUS 09/01/2008  . Hypothyroidism 09/01/2008  . GERD 09/01/2008   Scot Jun, PT, DPT, OCS, ATC 04/18/21  11:35 AM    [facility] [address] [place], [place], [address] Phone: [phone]   Fax:  [phone]  Name: [name] MRN: [id number] Date of Birth: 21-Sep-1961

## 2021-04-19 ENCOUNTER — Other Ambulatory Visit (HOSPITAL_COMMUNITY): Payer: Self-pay

## 2021-04-20 ENCOUNTER — Other Ambulatory Visit (HOSPITAL_COMMUNITY): Payer: Self-pay

## 2021-04-24 ENCOUNTER — Other Ambulatory Visit (HOSPITAL_COMMUNITY): Payer: Self-pay

## 2021-04-24 MED ORDER — CHLORTHALIDONE 25 MG PO TABS
25.0000 mg | ORAL_TABLET | Freq: Every morning | ORAL | 0 refills | Status: DC
  Filled 2021-04-24: qty 90, 90d supply, fill #0

## 2021-04-25 ENCOUNTER — Encounter: Payer: Self-pay | Admitting: Rehabilitative and Restorative Service Providers"

## 2021-04-25 ENCOUNTER — Other Ambulatory Visit: Payer: Self-pay

## 2021-04-25 ENCOUNTER — Ambulatory Visit (INDEPENDENT_AMBULATORY_CARE_PROVIDER_SITE_OTHER): Payer: 59 | Admitting: Rehabilitative and Restorative Service Providers"

## 2021-04-25 DIAGNOSIS — M25612 Stiffness of left shoulder, not elsewhere classified: Secondary | ICD-10-CM

## 2021-04-25 DIAGNOSIS — R600 Localized edema: Secondary | ICD-10-CM

## 2021-04-25 DIAGNOSIS — M6281 Muscle weakness (generalized): Secondary | ICD-10-CM

## 2021-04-25 DIAGNOSIS — M25512 Pain in left shoulder: Secondary | ICD-10-CM | POA: Diagnosis not present

## 2021-04-25 DIAGNOSIS — R293 Abnormal posture: Secondary | ICD-10-CM | POA: Diagnosis not present

## 2021-04-25 NOTE — Therapy (Addendum)
[facility] [address] [place], [place], [address] Phone: [phone]   Fax:  [phone]  Physical Therapy Treatment/Discharge  Patient Details  Name: [name] MRN: [id number] Date of Birth: Apr 08, 1961 Referring Provider (PT): Dr. Marlou Sa   Encounter Date: 04/25/2021   PT End of Session - 04/25/21 1058     Visit Number 12    Number of Visits 24    Date for PT Re-Evaluation 05/21/21    Authorization - Number of Visits 25    Progress Note Due on Visit 15    PT Start Time 1057    PT Stop Time 1137    PT Time Calculation (min) 40 min    Activity Tolerance Patient tolerated treatment well    Behavior During Therapy [facility] for tasks assessed/performed             Past Medical History:  Diagnosis Date   Abnormal Pap smear of cervix    Anemia 2007   due to fibroids   Duodenitis without hemorrhage    Esophageal stricture    Esophagitis    Fibromyalgia    GERD (gastroesophageal reflux disease)    Hiatal hernia    History of loop recorder 06/23/2020   had CVA 06-21-20   Hyperlipidemia    Hypertension    Hypothyroidism    Leiomyoma of uterus, unspecified    fibroids   Pneumonia    aspiration pneumonia after balloon surgery for weight loss   Stroke ([place]) 06/21/2020   Right MCA infarct-recieved TPA    Past Surgical History:  Procedure Laterality Date   ABDOMINAL HYSTERECTOMY     BICEPT TENODESIS Right 12/07/2019   Procedure: BICEPS TENODESIS;  Surgeon: Meredith Pel, MD;  Location: [place];  Service: Orthopedics;  Laterality: Right;   BUBBLE STUDY  06/23/2020   Procedure: BUBBLE STUDY;  Surgeon: Donato Heinz, MD;  Location: [facility] ENDOSCOPY;  Service: Cardiovascular;;   CARPAL TUNNEL RELEASE  2014   right wrist   CHOLECYSTECTOMY     HYSTEROSCOPY W/ ENDOMETRIAL ABLATION  4/05   LOOP RECORDER INSERTION N/A 06/23/2020   Procedure: LOOP RECORDER INSERTION;  Surgeon: Constance Haw, MD;   Location: [place] CV [facility];  Service: Cardiovascular;  Laterality: N/A;   RECTOCELE REPAIR  2007   SHOULDER ARTHROSCOPY WITH OPEN ROTATOR CUFF REPAIR Right 12/07/2019   Procedure: RIGHT SHOULDER ARTHROSCOPY,   MINI OPEN SUBSCAPULARIS, AND SUPRASPINATUS REPAIR, OPEN BICEPS TENODESIS;  Surgeon: Meredith Pel, MD;  Location: [place];  Service: Orthopedics;  Laterality: Right;   SHOULDER ARTHROSCOPY WITH ROTATOR CUFF REPAIR AND OPEN BICEPS TENODESIS Left 01/22/2021   Procedure: left shoulder arthroscopy debridement, mini open rotator cuff tear repair, biceps tenodesis;  Surgeon: Meredith Pel, MD;  Location: [place];  Service: Orthopedics;  Laterality: Left;  NEEDS RNFA PLEASE   TEE WITHOUT CARDIOVERSION N/A 06/23/2020   Procedure: TRANSESOPHAGEAL ECHOCARDIOGRAM (TEE);  Surgeon: Donato Heinz, MD;  Location: [facility] ENDOSCOPY;  Service: Cardiovascular;  Laterality: N/A;   TONSILLECTOMY     as child   TOTAL VAGINAL HYSTERECTOMY  5/07   TUBAL LIGATION  1991   BILATERAL    There were no vitals filed for this visit.   Subjective Assessment - 04/25/21 1057     Subjective Pt. indicated she was trying to do more reaching c arm to improve ability.  No pain today.    Limitations Lifting    Patient Stated Goals regain use of LUE  Currently in Pain? No/denies    Pain Score 0-No pain    Pain Onset More than a month ago                [facility] PT Assessment - 04/25/21 0001       Assessment   Medical Diagnosis Lt Mini open rotator cuff biceps tenodesis    Referring Provider (PT) Dr. Marlou Sa    Onset Date/Surgical Date 01/22/21    Hand Dominance Right      Observation/Other Assessments   Focus on Therapeutic Outcomes (FOTO)  update 63%                           [facility] Adult PT Treatment/Exercise - 04/25/21 0001       Shoulder Exercises: Prone   Other Prone Exercises prone y, prone t 2 x 15 each      Shoulder Exercises:  Standing   External Rotation Left   ER c flexion punch 2 x 10   Theraband Level (Shoulder External Rotation) Level 3 (Green)    Other Standing Exercises ball circles (2 lb ball) at 100 degrees flexion cw, ccw 30 x 1 each, standing flexion to abd to side to abd to flexion to front 1 lb 2 x 10 bilateral    Other Standing Exercises wall push up c SA press 2 x 15      Shoulder Exercises: ROM/Strengthening   UBE (Upper Arm Bike) Lvl 4 4 mins fwd/back each way with 15 seconds interval quicker at :45 -:60 each minute      Shoulder Exercises: Stretch   Other Shoulder Stretches IR rope stretch behind back c cues for prevent trunk flexion 30 sec x 3 Lt, doorway er stretch 30 sec x 3                      PT Short Term Goals - 03/28/21 1129       PT SHORT TERM GOAL #1   Title Patient will demonstrate independent use of home exercise program to maintain progress from in clinic treatments.    Time 4    Period Weeks    Status Achieved    Target Date 03/26/21               PT Long Term Goals - 04/25/21 1125       PT LONG TERM GOAL #1   Title Patient will demonstrate/report pain at worst less than or equal to 2/10 to facilitate minimal limitation in daily activity secondary to pain symptoms.    Time 12    Period Weeks    Status On-going    Target Date 05/21/21      PT LONG TERM GOAL #2   Title Patient will demonstrate independent use of home exercise program to facilitate ability to maintain/progress functional gains from skilled physical therapy services.    Time 12    Status Achieved    Target Date 05/21/21      PT LONG TERM GOAL #3   Title Patient will demonstrate return to work/recreational activity at previous level of function without limitations secondary due to condition    Time 12    Period Weeks    Status On-going    Target Date 05/21/21      PT LONG TERM GOAL #4   Title Pt. will demonstrate Lt GH joint AROM WFL s symptoms to facilitate usual dressing, self  care, reaching at PLOF.  Time 12    Period Weeks    Status On-going    Target Date 05/21/21      PT LONG TERM GOAL #5   Title Pt. will demonstrate RUE MMT 5/5 throughout to facilitate usual lifting, carrying in household and work activity at [employer].    Time 12    Period Weeks    Status On-going    Target Date 05/21/21      PT LONG TERM GOAL #6   Title FOTO score improved to 68 for improved function    Time 12    Period Weeks    Status On-going    Target Date 05/21/21                   Plan - 04/25/21 1116     Clinical Impression Statement Endurance and strengthening improving at this time but still evident impairments noted that impair functional activity.   Plan to continue progression in difficulty and movement to overhead reaching control.    Personal Factors and Comorbidities Comorbidity 3+    Comorbidities Rt RTC repair, fibromyalgia, HTN, HLD, CVA 05/2020 s/p tPA    Examination-Activity Limitations Bathing;Bed Mobility;Sleep;Carry;Lift;Hygiene/Grooming;Dressing;Reach Overhead    Examination-Participation Restrictions Cleaning;Community Activity;Driving;Laundry;Meal Prep;Occupation    Stability/Clinical Decision Making Evolving/Moderate complexity    Rehab Potential Good    PT Frequency 2x / week    PT Duration 12 weeks    PT Treatment/Interventions ADLs/Self Care Home Management;Cryotherapy;Electrical Stimulation;Moist Heat;Therapeutic exercise;Therapeutic activities;Ultrasound;Neuromuscular re-education;Patient/family education;Manual techniques;Vasopneumatic Device;Taping;Dry needling;Passive range of motion    PT Next Visit Plan Multidirectional stabilization/strengthening, movemnet to shoulder and over shoulder as tolerated.    PT Home Exercise Plan Access Code: [email or web address]    Consulted and Agree with Plan of Care Patient             Patient will benefit from skilled therapeutic intervention in order to improve the following deficits and impairments:   Increased edema,Pain,Decreased strength,Decreased range of motion,Impaired flexibility,Postural dysfunction,Impaired UE functional use  Visit Diagnosis: Acute pain of left shoulder  Stiffness of left shoulder, not elsewhere classified  Abnormal posture  Muscle weakness (generalized)  Localized edema     Problem List Patient Active Problem List   Diagnosis Date Noted   Traumatic complete tear of left rotator cuff    Biceps tendonitis on left    Essential hypertension 06/23/2020   Prediabetes 06/23/2020   Family hx-stroke 06/23/2020   Restless legs syndrome (RLS) 89/16/9450   Embolic cerebral infarction ([place]) s/p tPA, source unknown  06/21/2020   Obesity (BMI 30.0-34.9) 09/16/2012   Hyperlipidemia LDL goal <70 09/06/2008   FIBROMYALGIA 09/06/2008   HEADACHE, CHRONIC 09/06/2008   FIBROIDS, UTERUS 09/01/2008   Hypothyroidism 09/01/2008   GERD 09/01/2008    Scot Jun, PT, DPT, OCS, ATC 04/25/21  11:33 AM  PHYSICAL THERAPY DISCHARGE SUMMARY  Visits from Start of Care: 12  Current functional level related to goals / functional outcomes: See note   Remaining deficits: See note   Education / Equipment: HEP   Patient agrees to discharge. Patient goals were partially met. Patient is being discharged due to being pleased with the current functional level.  Scot Jun, PT, DPT, OCS, ATC 07/04/21  2:39 PM     [facility] [address] [place], [place], [address] Phone: [phone]   Fax:  [phone]  Name: [name] MRN: [id number] Date of Birth: 21-Mar-1961

## 2021-05-01 ENCOUNTER — Other Ambulatory Visit (HOSPITAL_COMMUNITY): Payer: Self-pay

## 2021-05-01 MED FILL — Losartan Potassium Tab 100 MG: ORAL | 30 days supply | Qty: 30 | Fill #2 | Status: AC

## 2021-05-02 ENCOUNTER — Other Ambulatory Visit (HOSPITAL_COMMUNITY): Payer: Self-pay

## 2021-05-03 ENCOUNTER — Other Ambulatory Visit (HOSPITAL_COMMUNITY): Payer: Self-pay

## 2021-05-03 MED ORDER — PRAMIPEXOLE DIHYDROCHLORIDE 0.125 MG PO TABS
0.1250 mg | ORAL_TABLET | Freq: Every day | ORAL | 6 refills | Status: DC
  Filled 2021-05-03: qty 30, 30d supply, fill #0
  Filled 2021-05-27: qty 30, 30d supply, fill #1
  Filled 2021-06-25: qty 30, 30d supply, fill #2
  Filled 2021-07-24: qty 30, 30d supply, fill #3
  Filled 2021-08-21: qty 30, 30d supply, fill #4
  Filled 2021-09-18: qty 30, 30d supply, fill #5
  Filled 2021-10-12: qty 30, 30d supply, fill #6

## 2021-05-09 ENCOUNTER — Ambulatory Visit (INDEPENDENT_AMBULATORY_CARE_PROVIDER_SITE_OTHER): Payer: 59 | Admitting: Orthopedic Surgery

## 2021-05-09 ENCOUNTER — Other Ambulatory Visit: Payer: Self-pay

## 2021-05-09 ENCOUNTER — Other Ambulatory Visit (HOSPITAL_COMMUNITY): Payer: Self-pay

## 2021-05-09 DIAGNOSIS — S46012A Strain of muscle(s) and tendon(s) of the rotator cuff of left shoulder, initial encounter: Secondary | ICD-10-CM

## 2021-05-09 MED ORDER — AIMOVIG 140 MG/ML ~~LOC~~ SOAJ
140.0000 mg | SUBCUTANEOUS | 3 refills | Status: DC
  Filled 2021-05-09: qty 1, 30d supply, fill #0
  Filled 2021-06-06: qty 1, 30d supply, fill #1
  Filled 2021-07-08: qty 1, 30d supply, fill #2
  Filled 2021-08-06: qty 1, 30d supply, fill #3
  Filled 2021-09-07: qty 1, 30d supply, fill #4
  Filled 2021-10-11: qty 1, 30d supply, fill #5
  Filled 2021-11-07: qty 1, 30d supply, fill #6
  Filled 2021-12-05: qty 1, 30d supply, fill #7
  Filled 2022-01-06: qty 1, 30d supply, fill #8
  Filled 2022-02-04: qty 1, 30d supply, fill #9
  Filled 2022-02-28: qty 1, 30d supply, fill #10
  Filled 2022-04-03: qty 1, 30d supply, fill #11

## 2021-05-09 NOTE — Progress Notes (Signed)
Carelink Summary Report / Loop Recorder 

## 2021-05-10 ENCOUNTER — Encounter: Payer: Self-pay | Admitting: Orthopedic Surgery

## 2021-05-10 NOTE — Progress Notes (Signed)
Post-Op Visit Note   Patient: [name]           Date of Birth: [birth year]           MRN: [id number] Visit Date: 05/09/2021 PCP: Crist Infante, MD   Assessment & Plan:  Chief Complaint:  Chief Complaint  Patient presents with   Left Shoulder - Follow-up   Visit Diagnoses:  1. Traumatic complete tear of left rotator cuff, initial encounter     Plan: Patient is a 60 year old female who presents s/p left shoulder arthroscopy with rotator cuff repair on 01/22/2021.  Patient reports she is doing well.  She denies any issues.  She does not wake with pain and she is able to sleep on her left side.  She finished physical therapy and is transitioning to home exercise program.  She has returned to work about 1.5 months ago and denies any issues with this.  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    On exam she has 60 degrees external rotation, 90 degrees abduction, 140 degrees forward flexion.  Excellent rotator cuff strength of infraspinatus, supraspinatus, subscapularis.  Incisions are well-healed.  Axillary nerve intact with deltoid firing.  Overall patient is doing very well and she plans to continue with home exercise program.  She notes that her left shoulder feels similar to the right shoulder that she had surgery on at this point in time.  She expects it will take about 6 to 7 months for her left shoulder to feel more normal like it did on the right.  Follow-up as needed.  Follow-Up Instructions: No follow-ups on file.   Orders:  No orders of the defined types were placed in this encounter.  No orders of the defined types were placed in this encounter.   Imaging: No results found.  PMFS History: Patient Active Problem List   Diagnosis Date Noted   Traumatic complete tear of left rotator cuff    Biceps tendonitis on left    Essential hypertension 06/23/2020   Prediabetes 06/23/2020   Family hx-stroke 06/23/2020   Restless legs  syndrome (RLS) 14/48/1856   Embolic cerebral infarction ([place]) s/p tPA, source unknown  06/21/2020   Obesity (BMI 30.0-34.9) 09/16/2012   Hyperlipidemia LDL goal <70 09/06/2008   FIBROMYALGIA 09/06/2008   HEADACHE, CHRONIC 09/06/2008   FIBROIDS, UTERUS 09/01/2008   Hypothyroidism 09/01/2008   GERD 09/01/2008   Past Medical History:  Diagnosis Date   Abnormal Pap smear of cervix    Anemia 2007   due to fibroids   Duodenitis without hemorrhage    Esophageal stricture    Esophagitis    Fibromyalgia    GERD (gastroesophageal reflux disease)    Hiatal hernia    History of loop recorder 06/23/2020   had CVA 06-21-20   Hyperlipidemia    Hypertension    Hypothyroidism    Leiomyoma of uterus, unspecified    fibroids   Pneumonia    aspiration pneumonia after balloon surgery for weight loss   Stroke ([place]) 06/21/2020   Right MCA infarct-recieved TPA    Family History  Problem Relation Age of Onset   Celiac disease Mother    Osteoporosis Mother    Heart disease Father    Stroke Father    Irritable bowel syndrome Sister    Lung cancer Maternal Grandmother    Breast cancer Paternal Grandmother    Heart disease Sister    Melanoma Sister    Colon cancer  Neg Hx    Esophageal cancer Neg Hx    Stomach cancer Neg Hx    Rectal cancer Neg Hx     Past Surgical History:  Procedure Laterality Date   ABDOMINAL HYSTERECTOMY     BICEPT TENODESIS Right 12/07/2019   Procedure: BICEPS TENODESIS;  Surgeon: Meredith Pel, MD;  Location: [place];  Service: Orthopedics;  Laterality: Right;   BUBBLE STUDY  06/23/2020   Procedure: BUBBLE STUDY;  Surgeon: Donato Heinz, MD;  Location: [facility] ENDOSCOPY;  Service: Cardiovascular;;   CARPAL TUNNEL RELEASE  2014   right wrist   CHOLECYSTECTOMY     HYSTEROSCOPY W/ ENDOMETRIAL ABLATION  4/05   LOOP RECORDER INSERTION N/A 06/23/2020   Procedure: LOOP RECORDER INSERTION;  Surgeon: Constance Haw, MD;  Location: [place] CV LAB;  Service: Cardiovascular;  Laterality: N/A;   RECTOCELE REPAIR  2007   SHOULDER ARTHROSCOPY WITH OPEN ROTATOR CUFF REPAIR Right 12/07/2019   Procedure: RIGHT SHOULDER ARTHROSCOPY,   MINI OPEN SUBSCAPULARIS, AND SUPRASPINATUS REPAIR, OPEN BICEPS TENODESIS;  Surgeon: Meredith Pel, MD;  Location: [place];  Service: Orthopedics;  Laterality: Right;   SHOULDER ARTHROSCOPY WITH ROTATOR CUFF REPAIR AND OPEN BICEPS TENODESIS Left 01/22/2021   Procedure: left shoulder arthroscopy debridement, mini open rotator cuff tear repair, biceps tenodesis;  Surgeon: Meredith Pel, MD;  Location: [place];  Service: Orthopedics;  Laterality: Left;  NEEDS RNFA PLEASE   TEE WITHOUT CARDIOVERSION N/A 06/23/2020   Procedure: TRANSESOPHAGEAL ECHOCARDIOGRAM (TEE);  Surgeon: Donato Heinz, MD;  Location: [facility] ENDOSCOPY;  Service: Cardiovascular;  Laterality: N/A;   TONSILLECTOMY     as child   TOTAL VAGINAL HYSTERECTOMY  5/07   TUBAL LIGATION  1991   BILATERAL   Social History   Occupational History   Occupation: Nurse  Tobacco Use   Smoking status: Former    Pack years: 0.00    Types: Cigarettes    Quit date: 12/23/2002    Years since quitting: 18.3   Smokeless tobacco: Never  Vaping Use   Vaping Use: Never used  Substance and Sexual Activity   Alcohol use: Not Currently   Drug use: No   Sexual activity: Yes    Partners: Male    Birth control/protection: Surgical    Comment: [facility]

## 2021-05-15 MED FILL — Omeprazole Cap Delayed Release 40 MG: ORAL | 90 days supply | Qty: 180 | Fill #0 | Status: AC

## 2021-05-16 ENCOUNTER — Other Ambulatory Visit (HOSPITAL_COMMUNITY): Payer: Self-pay

## 2021-05-16 LAB — CUP PACEART REMOTE DEVICE CHECK
Date Time Interrogation Session: 20220622000910
Implantable Pulse Generator Implant Date: 20210730

## 2021-05-18 ENCOUNTER — Ambulatory Visit (INDEPENDENT_AMBULATORY_CARE_PROVIDER_SITE_OTHER): Payer: 59

## 2021-05-18 DIAGNOSIS — I495 Sick sinus syndrome: Secondary | ICD-10-CM

## 2021-05-27 MED FILL — Losartan Potassium Tab 100 MG: ORAL | 30 days supply | Qty: 30 | Fill #3 | Status: AC

## 2021-05-29 ENCOUNTER — Other Ambulatory Visit (HOSPITAL_COMMUNITY): Payer: Self-pay

## 2021-05-29 MED ORDER — LEVOTHYROXINE SODIUM 75 MCG PO TABS
75.0000 ug | ORAL_TABLET | Freq: Every day | ORAL | 1 refills | Status: DC
  Filled 2021-05-29: qty 84, 90d supply, fill #0
  Filled 2021-08-28: qty 84, 90d supply, fill #1
  Filled 2021-11-26: qty 84, 90d supply, fill #2
  Filled 2021-11-27: qty 12, 14d supply, fill #2

## 2021-05-31 ENCOUNTER — Ambulatory Visit: Payer: 59

## 2021-06-01 NOTE — Progress Notes (Signed)
Carelink Summary Report / Loop Recorder 

## 2021-06-06 ENCOUNTER — Other Ambulatory Visit (HOSPITAL_COMMUNITY): Payer: Self-pay

## 2021-06-07 ENCOUNTER — Other Ambulatory Visit (HOSPITAL_COMMUNITY): Payer: Self-pay

## 2021-06-13 ENCOUNTER — Other Ambulatory Visit: Payer: Self-pay

## 2021-06-13 ENCOUNTER — Ambulatory Visit (INDEPENDENT_AMBULATORY_CARE_PROVIDER_SITE_OTHER): Payer: 59 | Admitting: Obstetrics & Gynecology

## 2021-06-13 ENCOUNTER — Other Ambulatory Visit (HOSPITAL_COMMUNITY): Payer: Self-pay

## 2021-06-13 ENCOUNTER — Encounter (HOSPITAL_BASED_OUTPATIENT_CLINIC_OR_DEPARTMENT_OTHER): Payer: Self-pay | Admitting: Obstetrics & Gynecology

## 2021-06-13 VITALS — BP 115/77 | HR 78 | Ht 64.5 in | Wt 176.4 lb

## 2021-06-13 DIAGNOSIS — Z01419 Encounter for gynecological examination (general) (routine) without abnormal findings: Secondary | ICD-10-CM | POA: Diagnosis not present

## 2021-06-13 DIAGNOSIS — Z780 Asymptomatic menopausal state: Secondary | ICD-10-CM | POA: Diagnosis not present

## 2021-06-13 DIAGNOSIS — Z90710 Acquired absence of both cervix and uterus: Secondary | ICD-10-CM | POA: Diagnosis not present

## 2021-06-13 DIAGNOSIS — I63411 Cerebral infarction due to embolism of right middle cerebral artery: Secondary | ICD-10-CM

## 2021-06-13 DIAGNOSIS — N8110 Cystocele, unspecified: Secondary | ICD-10-CM | POA: Diagnosis not present

## 2021-06-13 DIAGNOSIS — B009 Herpesviral infection, unspecified: Secondary | ICD-10-CM | POA: Diagnosis not present

## 2021-06-13 MED ORDER — ACYCLOVIR 400 MG PO TABS
400.0000 mg | ORAL_TABLET | Freq: Two times a day (BID) | ORAL | 4 refills | Status: DC
  Filled 2021-06-13: qty 180, 90d supply, fill #0
  Filled 2021-12-09: qty 180, 90d supply, fill #1
  Filled 2022-05-24: qty 180, 90d supply, fill #2

## 2021-06-13 NOTE — Progress Notes (Signed)
60 y.o. G72P3003 Married White or Caucasian female here for annual exam.    Had total rotator cuff tear after a fall in the ice.  Had surgery in February.  Had done PT.  Has tightness in left shoulder with arm elevation.  She had the right repaired in 2020.  Had middle cerebral artery occlusion.  Had full work up.  No clear cause identified except possible her HRT.  On baby ASA now.  Released by neurology.  Does have cardiology follow up.  Has sick sinus syndrome.  Has loop recorder.  She is off HRT.  Does need RF for acyclovir.   Patient's last menstrual period was 11/25/2005.          Sexually active: Yes.    The current method of family planning is status post hysterectomy.    Exercising: No.  Smoker:  no  Health Maintenance: Pap:  04/10/2017 Negative History of abnormal Pap:  remote hx MMG:  09/15/2020 Negative Colonoscopy:  10/10/2017 BMD:   done with Dr. Joylene Draft, normal TDaP:  2016 Shingrix:   completed Hep C testing: will do with Dr. Joylene Draft Screening Labs: done with Dr. Joylene Draft   reports that she quit smoking about 18 years ago. Her smoking use included cigarettes. She has never used smokeless tobacco. She reports previous alcohol use. She reports that she does not use drugs.  Past Medical History:  Diagnosis Date   Abnormal Pap smear of cervix    Anemia 2007   due to fibroids   Duodenitis without hemorrhage    Esophageal stricture    Esophagitis    Fibromyalgia    GERD (gastroesophageal reflux disease)    Hiatal hernia    History of loop recorder 06/23/2020   had CVA 06-21-20   Hyperlipidemia    Hypertension    Hypothyroidism    Leiomyoma of uterus, unspecified    fibroids   Pneumonia    aspiration pneumonia after balloon surgery for weight loss   Stroke ([place]) 06/21/2020   Right MCA infarct-recieved TPA    Past Surgical History:  Procedure Laterality Date   ABDOMINAL HYSTERECTOMY     BICEPT TENODESIS Right 12/07/2019   Procedure: BICEPS TENODESIS;  Surgeon:  Meredith Pel, MD;  Location: [facility];  Service: Orthopedics;  Laterality: Right;   BUBBLE STUDY  06/23/2020   Procedure: BUBBLE STUDY;  Surgeon: Donato Heinz, MD;  Location: [facility] ENDOSCOPY;  Service: Cardiovascular;;   CARPAL TUNNEL RELEASE  2014   right wrist   CHOLECYSTECTOMY     HYSTEROSCOPY W/ ENDOMETRIAL ABLATION  4/05   LOOP RECORDER INSERTION N/A 06/23/2020   Procedure: LOOP RECORDER INSERTION;  Surgeon: Constance Haw, MD;  Location: [facility] CV LAB;  Service: Cardiovascular;  Laterality: N/A;   RECTOCELE REPAIR  2007   SHOULDER ARTHROSCOPY WITH OPEN ROTATOR CUFF REPAIR Right 12/07/2019   Procedure: RIGHT SHOULDER ARTHROSCOPY,   MINI OPEN SUBSCAPULARIS, AND SUPRASPINATUS REPAIR, OPEN BICEPS TENODESIS;  Surgeon: Meredith Pel, MD;  Location: [facility];  Service: Orthopedics;  Laterality: Right;   SHOULDER ARTHROSCOPY WITH ROTATOR CUFF REPAIR AND OPEN BICEPS TENODESIS Left 01/22/2021   Procedure: left shoulder arthroscopy debridement, mini open rotator cuff tear repair, biceps tenodesis;  Surgeon: Meredith Pel, MD;  Location: [place];  Service: Orthopedics;  Laterality: Left;  NEEDS RNFA PLEASE   TEE WITHOUT CARDIOVERSION N/A 06/23/2020   Procedure: TRANSESOPHAGEAL ECHOCARDIOGRAM (TEE);  Surgeon: Donato Heinz, MD;  Location: [facility] ENDOSCOPY;  Service: Cardiovascular;  Laterality: N/A;   TONSILLECTOMY     as child   TOTAL VAGINAL HYSTERECTOMY  5/07   TUBAL LIGATION  1991   BILATERAL    Current Outpatient Medications  Medication Sig Dispense Refill   aspirin EC 81 MG EC tablet Take 1 tablet (81 mg total) by mouth daily. Swallow whole. 30 tablet 11   chlorthalidone (HYGROTON) 25 MG tablet Take 1 tablet (25 mg total) by mouth every morning. 90 tablet 0   Cholecalciferol (VITAMIN D) 2000 units tablet Take 2,000 Units by mouth 2 (two) times daily.     DULoxetine (CYMBALTA) 30 MG capsule TAKE 1  CAPSULE BY MOUTH DAILY 90 capsule 2   Erenumab-aooe (AIMOVIG) 140 MG/ML SOAJ Inject 140 mg into the skin every 30 (thirty) days. 3 mL 3   levothyroxine (SYNTHROID) 75 MCG tablet Take 1 tablet (75 mcg total) by mouth daily for 6 days and 1/2 (one-half) a tablet by mouth for one day a week. 90 tablet 1   losartan (COZAAR) 100 MG tablet TAKE 1 TABLET BY MOUTH ONCE DAILY 90 tablet 2   Melatonin 5 MG CAPS Take 5 mg by mouth at bedtime.     Multiple Vitamin (MULTIVITAMIN) capsule Take 1 capsule by mouth daily.     omeprazole (PRILOSEC) 40 MG capsule TAKE 1 CAPSULE BY MOUTH TWICE DAILY. 180 capsule 2   rosuvastatin (CRESTOR) 10 MG tablet TAKE 1 TABLET BY MOUTH EVERY DAY 90 tablet 3   vitamin B-12 (CYANOCOBALAMIN) 100 MCG tablet Take 100 mcg by mouth daily.     acyclovir (ZOVIRAX) 400 MG tablet Take 1 tablet (400 mg total) by mouth 2 (two) times daily. 180 tablet 4   pramipexole (MIRAPEX) 0.125 MG tablet TAKE 1 TABLET BY MOUTH 2 TO 3 HOURS PRIOR TO BEDTIME 90 tablet 3   No current facility-administered medications for this visit.    Family History  Problem Relation Age of Onset   Celiac disease Mother    Osteoporosis Mother    Heart disease Father    Stroke Father    Irritable bowel syndrome Sister    Lung cancer Maternal Grandmother    Breast cancer Paternal Grandmother    Heart disease Sister    Melanoma Sister    Colon cancer Neg Hx    Esophageal cancer Neg Hx    Stomach cancer Neg Hx    Rectal cancer Neg Hx     Review of Systems  All other systems reviewed and are negative.  Exam:   BP 115/77 (BP Location: Left Arm, Patient Position: Sitting, Cuff Size: Large)   Pulse 78   Ht 5' 4.5" (1.638 m)   Wt 176 lb 6.4 oz (80 kg)   LMP 11/25/2005   BMI 29.81 kg/m   Height: 5' 4.5" (163.8 cm)  General appearance: alert, cooperative and appears stated age Head: Normocephalic, without obvious abnormality, atraumatic Neck: no adenopathy, supple, symmetrical, trachea midline and thyroid  normal to inspection and palpation Lungs: clear to auscultation bilaterally Breasts: normal appearance, no masses or tenderness Heart: regular rate and rhythm Abdomen: soft, non-tender; bowel sounds normal; no masses,  no organomegaly Extremities: extremities normal, atraumatic, no cyanosis or edema Skin: Skin color, texture, turgor normal. No rashes or lesions Lymph nodes: Cervical, supraclavicular, and axillary nodes normal. No abnormal inguinal nodes palpated Neurologic: Grossly normal   Pelvic: External genitalia:  no lesions              Urethra:  normal appearing urethra with  no masses, tenderness or lesions              Bartholins and Skenes: normal                 Vagina: normal appearing vagina with normal color and no discharge, no lesions, 3rd degree cystocele              Cervix: absent              Pap taken: No. Bimanual Exam:  Uterus:  uterus absent              Adnexa: no mass, fullness, tenderness               Rectovaginal: Confirms               Anus:  normal sphincter tone, no lesions  Chaperone, Octaviano Batty, CMA, was present for exam.  Assessment/Plan: 1. Well woman exam with routine gynecological exam - pap smear not indicated - MMG 08/2020 - colonoscopy 09/2017 - BMD done with Dr. Joylene Draft - vaccines updated  2. Postmenopausal - no HRT  3. Cerebral infarction due to embolism of right middle cerebral artery ([place]) - on baby ASA  4. H/O: hysterectomy  5. Female cystocele  6. HSV-1 infection - acyclovir rx to pharmacy

## 2021-06-17 ENCOUNTER — Telehealth: Payer: 59 | Admitting: Family

## 2021-06-17 DIAGNOSIS — L255 Unspecified contact dermatitis due to plants, except food: Secondary | ICD-10-CM | POA: Diagnosis not present

## 2021-06-17 MED ORDER — PREDNISONE 10 MG PO TABS
ORAL_TABLET | ORAL | 0 refills | Status: DC
Start: 2021-06-17 — End: 2021-08-04

## 2021-06-17 NOTE — Progress Notes (Signed)
E-Visit for Poison Ivy  We are sorry that you are not feeing well.  Here is how we plan to help!  Based on what you have shared with me it looks like you have had an allergic reaction to the oily resin from a group of plants.  This resin is very sticky, so it easily attaches to your skin, clothing, tools equipment, and pet's fur.    This blistering rash is often called poison ivy rash although it can come from contact with the leaves, stems and roots of poison ivy, poison oak and poison sumac.  The oily resin contains urushiol (u-ROO-she-ol) that produces a skin rash on exposed skin.  The severity of the rash depends on the amount of urushiol that gets on your skin.  A section of skin with more urushiol on it may develop a rash sooner.  The rash usually develops 12-48 hours after exposure and can last two to three weeks.  Your skin must come in direct contact with the plant's oil to be affected.  Blister fluid doesn't spread the rash.  However, if you come into contact with a piece of clothing or pet fur that has urushiol on it, the rash may spread out.  You can also transfer the oil to other parts of your body with your fingers.  Often the rash looks like a straight line because of the way the plant brushes against your skin.  Since your rash is widespread or has resulted in a large number of blisters, I have prescribed an oral corticosteroid.  Please follow these recommendations:  I have sent a prednisone dose pack to your chosen pharmacy. Be sure to follow the instructions carefully and complete the entire prescription. You may use Benadryl or Caladryl topical lotions to sooth the itch and remember cool, not hot, showers and baths can help relieve the itching!  Place cool, wet compresses on the affected area for 15-30 minutes several times a day.  You may also take oral antihistamines, such as diphenhydramine (Benadryl, others), which may also help you sleep better.  Watch your skin for any purulent  (pus) drainage or red streaking from the site.  If this occurs, contact your provider.  You may require an antibiotic for a skin infection.  Make sure that the clothes you were wearing as well as any towels or sheets that may have come in contact with the oil (urushiol) are washed in detergent and hot water.       I have developed the following plan to treat your condition I am prescribing a two week course of steroids (37 tablets of 10 mg prednisone).  Days 1-4 take 4 tablets (40 mg) daily  Days 5-8 take 3 tablets (30 mg) daily, Days 9-11 take 2 tablets (20 mg) daily, Days 12-14 take 1 tablet (10 mg) daily.    What can you do to prevent this rash?  Avoid the plants.  Learn how to identify poison ivy, poison oak and poison sumac in all seasons.  When hiking or engaging in other activities that might expose you to these plants, try to stay on cleared pathways.  If camping, make sure you pitch your tent in an area free of these plants.  Keep pets from running through wooded areas so that urushiol doesn't accidentally stick to their fur, which you may touch.  Remove or kill the plants.  In your yard, you can get rid of poison ivy by applying an herbicide or pulling it out of   the ground, including the roots, while wearing heavy gloves.  Afterward remove the gloves and thoroughly wash them and your hands.  Don't burn poison ivy or related plants because the urushiol can be carried by smoke.  Wear protective clothing.  If needed, protect your skin by wearing socks, boots, pants, long sleeves and vinyl gloves.  Wash your skin right away.  Washing off the oil with soap and water within 30 minutes of exposure may reduce your chances of getting a poison ivy rash.  Even washing after an hour or so can help reduce the severity of the rash.  If you walk through some poison ivy and then later touch your shoes, you may get some urushiol on your hands, which may then transfer to your face or body by touching or  rubbing.  If the contaminated object isn't cleaned, the urushiol on it can still cause a skin reaction years later.    Be careful not to reuse towels after you have washed your skin.  Also carefully wash clothing in detergent and hot water to remove all traces of the oil.  Handle contaminated clothing carefully so you don't transfer the urushiol to yourself, furniture, rugs or appliances.  Remember that pets can carry the oil on their fur and paws.  If you think your pet may be contaminated with urushiol, put on some long rubber gloves and give your pet a bath.  Finally, be careful not to burn these plants as the smoke can contain traces of the oil.  Inhaling the smoke may result in difficulty breathing. If that occurred you should see a physician as soon as possible.  See your doctor right away if:  The reaction is severe or widespread You inhaled the smoke from burning poison ivy and are having difficulty breathing Your skin continues to swell The rash affects your eyes, mouth or genitals Blisters are oozing pus You develop a fever greater than 100 F (37.8 C) The rash doesn't get better within a few weeks.  If you scratch the poison ivy rash, bacteria under your fingernails may cause the skin to become infected.  See your doctor if pus starts oozing from the blisters.  Treatment generally includes antibiotics.  Poison ivy treatments are usually limited to self-care methods.  And the rash typically goes away on its own in two to three weeks.     If the rash is widespread or results in a large number of blisters, your doctor may prescribe an oral corticosteroid, such as prednisone.  If a bacterial infection has developed at the rash site, your doctor may give you a prescription for an oral antibiotic.  MAKE SURE YOU  Understand these instructions. Will watch your condition. Will get help right away if you are not doing well or get worse.   Thank you for choosing an e-visit.  Your  e-visit answers were reviewed by a board certified advanced clinical practitioner to complete your personal care plan. Depending upon the condition, your plan could have included both over the counter or prescription medications.  Please review your pharmacy choice. Make sure the pharmacy is open so you can pick up prescription now. If there is a problem, you may contact your provider through MyChart messaging and have the prescription routed to another pharmacy.  Your safety is important to us. If you have drug allergies check your prescription carefully.   For the next 24 hours you can use MyChart to ask questions about today's visit, request a non-urgent   call back, or ask for a work or school excuse. You will get an email in the next two days asking about your experience. I hope that your e-visit has been valuable and will speed your recovery.   Approximately 5 minutes was spent documenting and reviewing patient's chart.

## 2021-06-18 ENCOUNTER — Ambulatory Visit (INDEPENDENT_AMBULATORY_CARE_PROVIDER_SITE_OTHER): Payer: 59

## 2021-06-18 DIAGNOSIS — I495 Sick sinus syndrome: Secondary | ICD-10-CM | POA: Diagnosis not present

## 2021-06-20 LAB — CUP PACEART REMOTE DEVICE CHECK
Date Time Interrogation Session: 20220725000635
Implantable Pulse Generator Implant Date: 20210730

## 2021-06-25 ENCOUNTER — Other Ambulatory Visit (HOSPITAL_COMMUNITY): Payer: Self-pay

## 2021-07-04 MED FILL — Losartan Potassium Tab 100 MG: ORAL | 30 days supply | Qty: 30 | Fill #4 | Status: AC

## 2021-07-05 ENCOUNTER — Other Ambulatory Visit (HOSPITAL_COMMUNITY): Payer: Self-pay

## 2021-07-06 DIAGNOSIS — E785 Hyperlipidemia, unspecified: Secondary | ICD-10-CM | POA: Diagnosis not present

## 2021-07-06 DIAGNOSIS — R7301 Impaired fasting glucose: Secondary | ICD-10-CM | POA: Diagnosis not present

## 2021-07-08 ENCOUNTER — Other Ambulatory Visit (HOSPITAL_COMMUNITY): Payer: Self-pay

## 2021-07-09 ENCOUNTER — Other Ambulatory Visit (HOSPITAL_COMMUNITY): Payer: Self-pay

## 2021-07-12 MED FILL — Duloxetine HCl Enteric Coated Pellets Cap 30 MG (Base Eq): ORAL | 90 days supply | Qty: 90 | Fill #1 | Status: AC

## 2021-07-12 MED FILL — Rosuvastatin Calcium Tab 10 MG: ORAL | 90 days supply | Qty: 90 | Fill #1 | Status: CN

## 2021-07-13 ENCOUNTER — Other Ambulatory Visit (HOSPITAL_COMMUNITY): Payer: Self-pay

## 2021-07-13 DIAGNOSIS — Z1331 Encounter for screening for depression: Secondary | ICD-10-CM | POA: Diagnosis not present

## 2021-07-13 DIAGNOSIS — Z1389 Encounter for screening for other disorder: Secondary | ICD-10-CM | POA: Diagnosis not present

## 2021-07-13 DIAGNOSIS — E785 Hyperlipidemia, unspecified: Secondary | ICD-10-CM | POA: Diagnosis not present

## 2021-07-13 DIAGNOSIS — Z1212 Encounter for screening for malignant neoplasm of rectum: Secondary | ICD-10-CM | POA: Diagnosis not present

## 2021-07-13 DIAGNOSIS — G43909 Migraine, unspecified, not intractable, without status migrainosus: Secondary | ICD-10-CM | POA: Diagnosis not present

## 2021-07-13 DIAGNOSIS — Z0000 Encounter for general adult medical examination without abnormal findings: Secondary | ICD-10-CM | POA: Diagnosis not present

## 2021-07-13 DIAGNOSIS — I10 Essential (primary) hypertension: Secondary | ICD-10-CM | POA: Diagnosis not present

## 2021-07-13 DIAGNOSIS — N951 Menopausal and female climacteric states: Secondary | ICD-10-CM | POA: Diagnosis not present

## 2021-07-13 DIAGNOSIS — R7301 Impaired fasting glucose: Secondary | ICD-10-CM | POA: Diagnosis not present

## 2021-07-13 DIAGNOSIS — I639 Cerebral infarction, unspecified: Secondary | ICD-10-CM | POA: Diagnosis not present

## 2021-07-13 DIAGNOSIS — R82998 Other abnormal findings in urine: Secondary | ICD-10-CM | POA: Diagnosis not present

## 2021-07-13 MED ORDER — ROSUVASTATIN CALCIUM 20 MG PO TABS
20.0000 mg | ORAL_TABLET | ORAL | 3 refills | Status: DC
Start: 2021-07-13 — End: 2022-07-12
  Filled 2021-07-13: qty 90, 90d supply, fill #0
  Filled 2021-10-12: qty 90, 90d supply, fill #1
  Filled 2022-01-07: qty 90, 90d supply, fill #2
  Filled 2022-04-11: qty 90, 90d supply, fill #3

## 2021-07-13 NOTE — Progress Notes (Signed)
Carelink Summary Report / Loop Recorder 

## 2021-07-16 ENCOUNTER — Other Ambulatory Visit: Payer: Self-pay | Admitting: Internal Medicine

## 2021-07-16 DIAGNOSIS — E785 Hyperlipidemia, unspecified: Secondary | ICD-10-CM

## 2021-07-19 ENCOUNTER — Ambulatory Visit (INDEPENDENT_AMBULATORY_CARE_PROVIDER_SITE_OTHER): Payer: 59

## 2021-07-19 DIAGNOSIS — I495 Sick sinus syndrome: Secondary | ICD-10-CM | POA: Diagnosis not present

## 2021-07-23 LAB — CUP PACEART REMOTE DEVICE CHECK
Date Time Interrogation Session: 20220827000540
Implantable Pulse Generator Implant Date: 20210730

## 2021-07-24 ENCOUNTER — Other Ambulatory Visit (HOSPITAL_COMMUNITY): Payer: Self-pay

## 2021-07-25 ENCOUNTER — Other Ambulatory Visit (HOSPITAL_COMMUNITY): Payer: Self-pay

## 2021-07-25 MED ORDER — CHLORTHALIDONE 25 MG PO TABS
25.0000 mg | ORAL_TABLET | Freq: Every morning | ORAL | 3 refills | Status: DC
  Filled 2021-07-25: qty 90, 90d supply, fill #0
  Filled 2021-10-27: qty 90, 90d supply, fill #1
  Filled 2022-01-28: qty 90, 90d supply, fill #2
  Filled 2022-05-08: qty 90, 90d supply, fill #3

## 2021-08-02 NOTE — Progress Notes (Signed)
Carelink Summary Report / Loop Recorder 

## 2021-08-04 ENCOUNTER — Telehealth: Payer: 59 | Admitting: Nurse Practitioner

## 2021-08-04 ENCOUNTER — Ambulatory Visit
Admission: EM | Admit: 2021-08-04 | Discharge: 2021-08-04 | Disposition: A | Discharge status: Home / Self Care | Payer: 59 | Attending: Family Medicine | Admitting: Family Medicine

## 2021-08-04 ENCOUNTER — Other Ambulatory Visit: Payer: Self-pay

## 2021-08-04 DIAGNOSIS — N3090 Cystitis, unspecified without hematuria: Secondary | ICD-10-CM

## 2021-08-04 DIAGNOSIS — R399 Unspecified symptoms and signs involving the genitourinary system: Secondary | ICD-10-CM

## 2021-08-04 DIAGNOSIS — M545 Low back pain, unspecified: Secondary | ICD-10-CM

## 2021-08-04 LAB — POCT URINALYSIS DIP (MANUAL ENTRY)
Glucose, UA: 100 mg/dL — AB
Nitrite, UA: POSITIVE — AB
Protein Ur, POC: >=300 mg/dL — AB
Spec Grav, UA: 1.015 (ref 1.010–1.025)
Urobilinogen, UA: >=8 E.U./dL — AB
pH, UA: 6 (ref 5.0–8.0)

## 2021-08-04 MED ORDER — CEPHALEXIN 500 MG PO CAPS: 500.0000 mg | ORAL_CAPSULE | Freq: Two times a day (BID) | ORAL | 0 refills | Status: DC

## 2021-08-04 MED ORDER — PHENAZOPYRIDINE HCL 200 MG PO TABS: 200.0000 mg | ORAL_TABLET | Freq: Three times a day (TID) | ORAL | 0 refills | Status: DC

## 2021-08-04 NOTE — ED Triage Notes (Signed)
Pt c/o hematuria, polyuria, decreased urine output, urgency, pelvic pain. Denies odor or discharge. Onset this morning.

## 2021-08-04 NOTE — ED Provider Notes (Signed)
[facility]    ASSESSMENT & PLAN:  1. Cystitis    Begin: Meds ordered this encounter  Medications   cephALEXin (KEFLEX) 500 MG capsule    Sig: Take 1 capsule (500 mg total) by mouth 2 (two) times daily.    Dispense:  10 capsule    Refill:  0   phenazopyridine (PYRIDIUM) 200 MG tablet    Sig: Take 1 tablet (200 mg total) by mouth 3 (three) times daily.    Dispense:  6 tablet    Refill:  0   No signs of pyelonephritis. Urine culture sent. Will follow up with her PCP or here if not showing improvement over the next 48 hours, sooner if needed.  Outlined signs and symptoms indicating need for more acute intervention. Patient verbalized understanding. After Visit Summary given.  SUBJECTIVE:  [name] is a 60 y.o. female who complains of urinary frequency, urgency and dysuria; onset today. Without associated flank pain, fever, chills, vaginal discharge or bleeding. Gross hematuria: present. No specific aggravating or alleviating factors reported. No LE edema. Normal PO intake without n/v/d. Without specific abdominal pain. Ambulatory without difficulty. OTC treatment: none.  LMP: Patient's last menstrual period was 11/25/2005.   OBJECTIVE:  Vitals:   08/04/21 1348  BP: (!) 131/93  Pulse: 83  Resp: 18  Temp: 98 F (36.7 C)  TempSrc: Oral  SpO2: 98%   General appearance: alert; no distress Lungs: unlabored respirations Skin: warm and dry Neurologic: normal gait Psychological: alert and cooperative; normal mood and affect  Labs Reviewed  POCT URINALYSIS DIP (MANUAL ENTRY) - Abnormal; Notable for the following components:      Result Value   Color, UA red (*)    Clarity, UA turbid (*)    Glucose, UA =100 (*)    Bilirubin, UA large (*)    Ketones, POC UA small (15) (*)    Blood, UA large (*)    Protein Ur, POC >=300 (*)    Urobilinogen, UA >=8.0 (*)    Nitrite, UA Positive (*)    Leukocytes, UA Large (3+) (*)    All other components within  normal limits  URINE CULTURE    No Known Allergies  Past Medical History:  Diagnosis Date   Duodenitis without hemorrhage    Esophageal stricture    Esophagitis    Fibromyalgia    GERD (gastroesophageal reflux disease)    Hiatal hernia    History of anemia 2007   due to fibroids   History of loop recorder 06/23/2020   had CVA 06-21-20   Hyperlipidemia    Hypertension    Hypothyroidism    Leiomyoma of uterus, unspecified    Pneumonia    aspiration pneumonia after balloon surgery for weight loss   Stroke [facility]) 06/21/2020   Right MCA infarct-recieved TPA   Social History   Socioeconomic History   Marital status: Married    Spouse name: Not on file   Number of children: 3   Years of education: Not on file   Highest education level: Not on file  Occupational History   Occupation: Nurse  Tobacco Use   Smoking status: Former    Types: Cigarettes    Quit date: 12/23/2002    Years since quitting: 18.6   Smokeless tobacco: Never  Vaping Use   Vaping Use: Never used  Substance and Sexual Activity   Alcohol use: Not Currently   Drug use: No   Sexual activity: Yes    Partners: Male  Birth control/protection: Surgical    Comment: [facility]  Other Topics Concern   Not on file  Social History Narrative   Not on file   Social Determinants of Health   Financial Resource Strain: Not on file  Food Insecurity: Not on file  Transportation Needs: Not on file  Physical Activity: Not on file  Stress: Not on file  Social Connections: Not on file  Intimate Partner Violence: Not on file   Family History  Problem Relation Age of Onset   Celiac disease Mother    Osteoporosis Mother    Heart disease Father    Stroke Father    Irritable bowel syndrome Sister    Lung cancer Maternal Grandmother    Breast cancer Paternal Grandmother    Heart disease Sister    Melanoma Sister    Colon cancer Neg Hx    Esophageal cancer Neg Hx    Stomach cancer Neg Hx    Rectal cancer Neg Hx          Vanessa Kick, MD 08/04/21 1417

## 2021-08-04 NOTE — Progress Notes (Signed)
Based on what you shared with me it looks like you have uti symptoms with back pain,that should be evaluated in a face to face office visit. Due to the associating back pain you will need a urinalysis and urine culture for proper treatment. NOTE: There will be NO CHARGE for this eVisit   If you are having a true medical emergency please call 911.      For an urgent face to face visit, [name] has six urgent care centers for your convenience:     [facility] [place] Get Driving Directions [phone] [address] [place], [place] [address]    [facility] ([place]) Get Driving Directions [phone] [address] [place], [place] [address]  [facility] ([place]) Get Driving Directions [phone] [address] [place],  [place]  [address]  [facility] Get Driving Directions [phone] [address] [place], [place] [address]   [facility] [place] Get Driving Directions  [phone] [address] [place], [place] [address]   [facility] [place] Get Driving Directions [phone] [address] [place],  [address]  Your MyChart E-visit questionnaire answers were reviewed by a board certified advanced clinical practitioner to complete your personal care plan based on your specific symptoms.  Thank you for using e-Visits.         

## 2021-08-06 ENCOUNTER — Other Ambulatory Visit (HOSPITAL_COMMUNITY): Payer: Self-pay

## 2021-08-06 LAB — URINE CULTURE: Culture: >=100000 — AB

## 2021-08-06 MED FILL — Losartan Potassium Tab 100 MG: ORAL | 30 days supply | Qty: 30 | Fill #5 | Status: AC

## 2021-08-06 MED FILL — Omeprazole Cap Delayed Release 40 MG: ORAL | 90 days supply | Qty: 180 | Fill #1 | Status: AC

## 2021-08-09 ENCOUNTER — Ambulatory Visit
Admission: RE | Admit: 2021-08-09 | Discharge: 2021-08-09 | Disposition: A | Discharge status: Home / Self Care | Payer: No Typology Code available for payment source | Source: Ambulatory Visit | Attending: Internal Medicine | Admitting: Internal Medicine

## 2021-08-09 DIAGNOSIS — E785 Hyperlipidemia, unspecified: Secondary | ICD-10-CM

## 2021-08-12 IMAGING — MR MR SHOULDER*R* W/CM
5 series · 40 of 40 positions shown · IV contrast (agent unspecified)
Comparison: None.

CLINICAL DATA: Shoulder pain and weakness after trauma

EXAM:
MR ARTHROGRAM OF THE  SHOULDER
TECHNIQUE: Multiplanar, multisequence MR imaging of the bilateral shoulder was
performed following the administration of intra-articular contrast.
CONTRAST:  See Injection Documentation.

[Series 3: T1 fat-sat · axial · 4.0mm · 0.27mm/px · z∈[-28,+55]mm · 8 of 18 slices shown (1 of 3)]
[im 1/18]
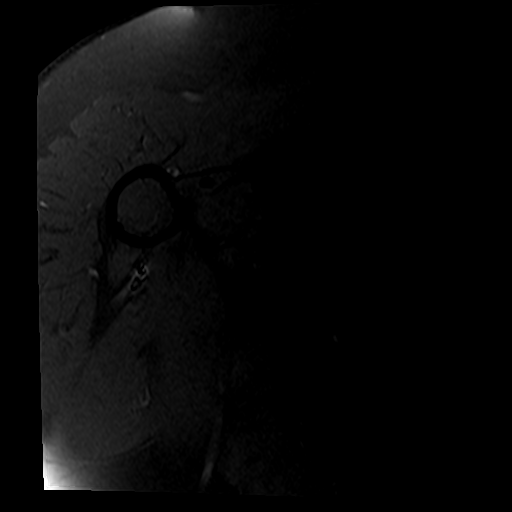
[im 3/18]
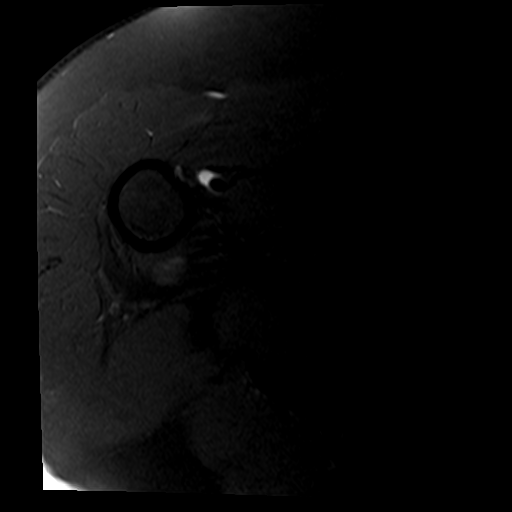
[im 5/18]
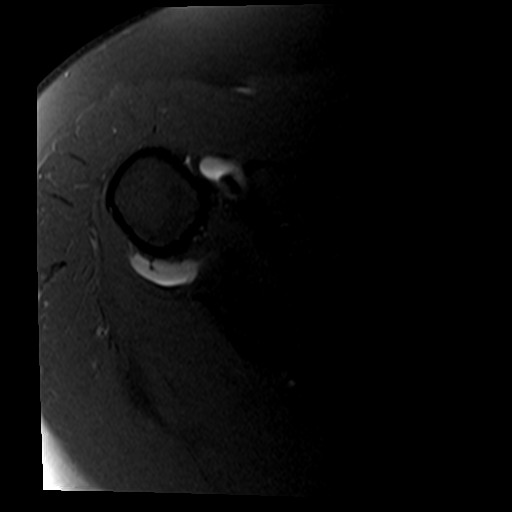
[im 8/18]
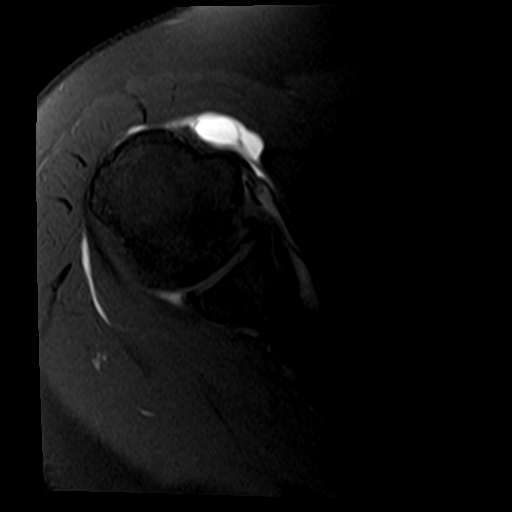
[im 10/18]
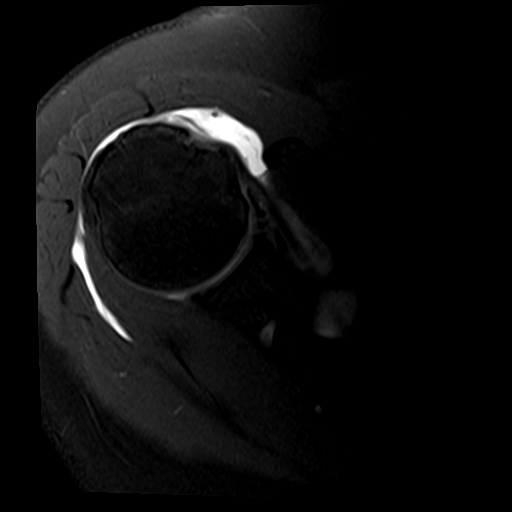
[im 13/18]
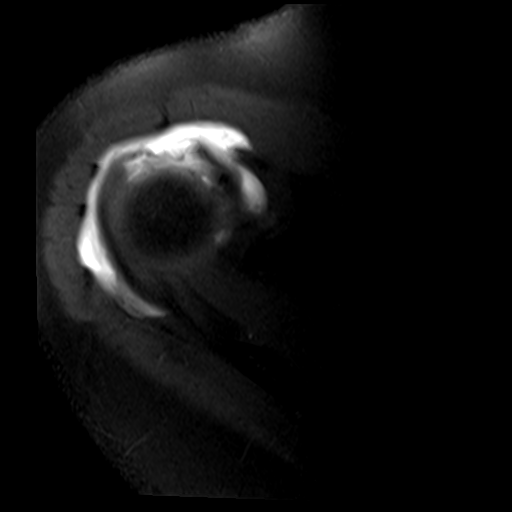
[im 15/18]
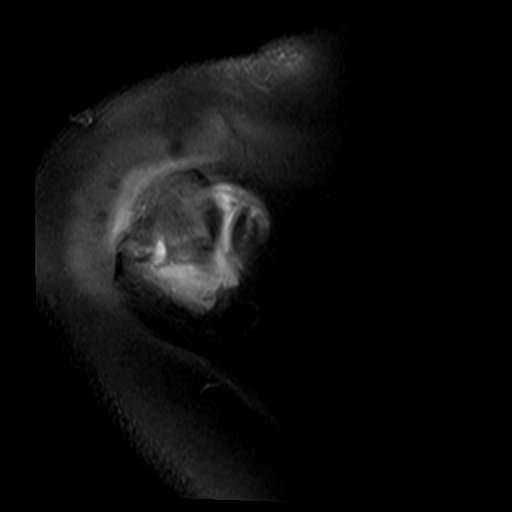
[im 18/18]
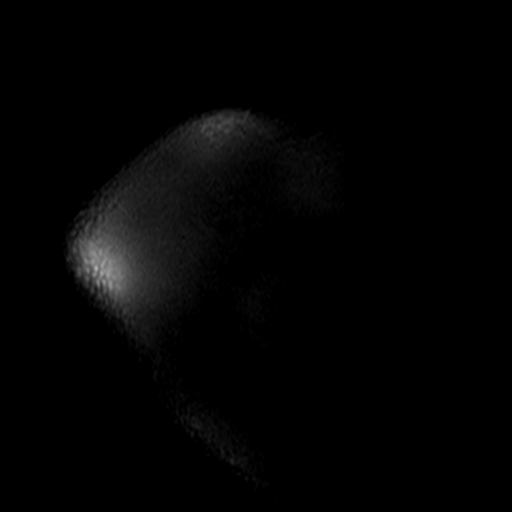

[Series 5: T1 fat-sat · sagittal · 4.0mm · 0.55mm/px · 7 of 16 slices shown (2 of 3)]
[im 1/16]
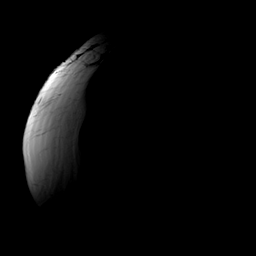
[im 3/16]
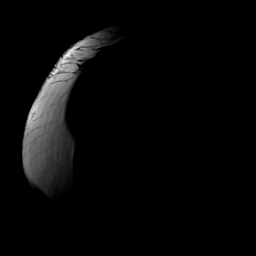
[im 6/16]
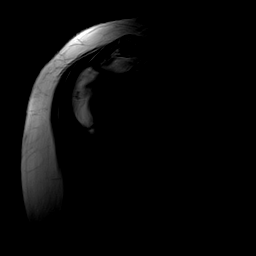
[im 8/16]
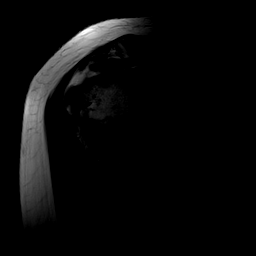
[im 11/16]
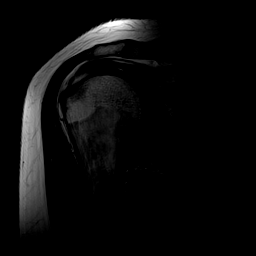
[im 13/16]
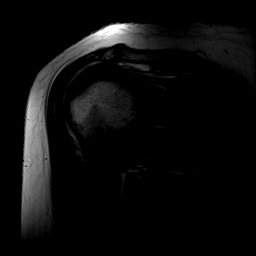
[im 16/16]
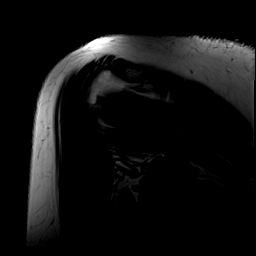

[Series 6: T1 fat-sat · sagittal · 4.0mm · 0.55mm/px · 8 of 16 slices shown (3 of 3)]
[im 1/16]
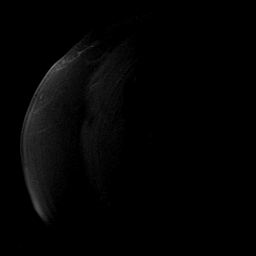
[im 3/16]
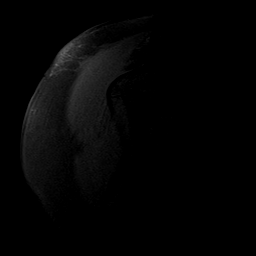
[im 5/16]
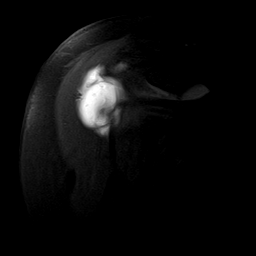
[im 7/16]
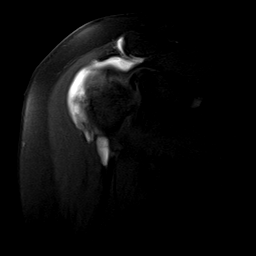
[im 9/16]
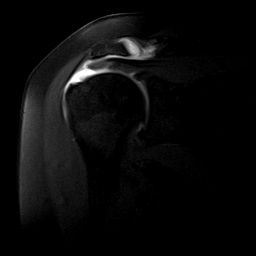
[im 11/16]
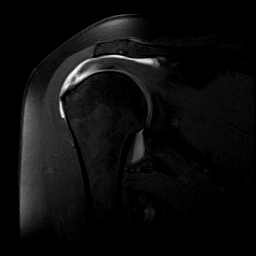
[im 13/16]
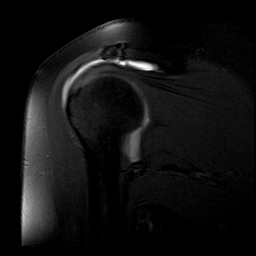
[im 16/16]
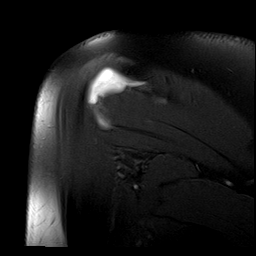

[Series 7: T2 fat-sat · sagittal · 4.0mm · 0.55mm/px · 8 of 16 slices shown (1 of 2)]
[im 1/16]
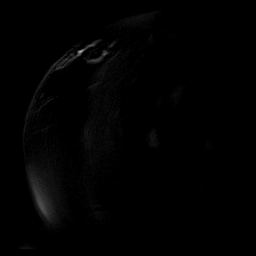
[im 3/16]
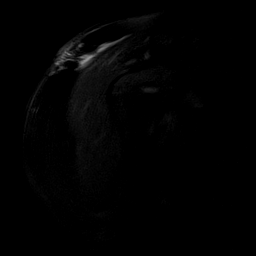
[im 5/16]
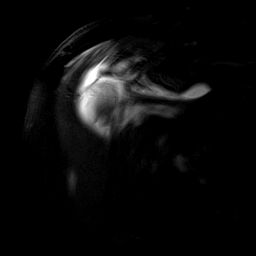
[im 7/16]
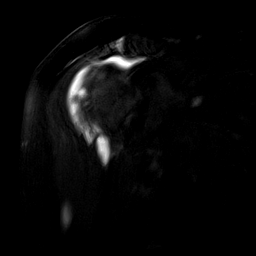
[im 9/16]
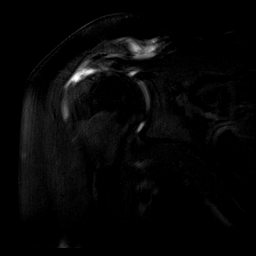
[im 11/16]
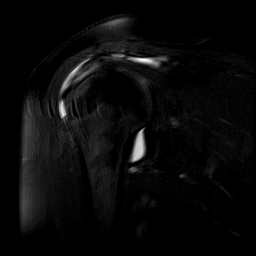
[im 13/16]
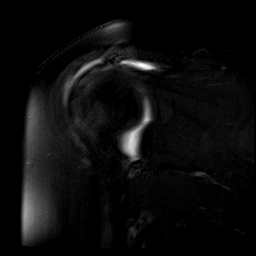
[im 16/16]
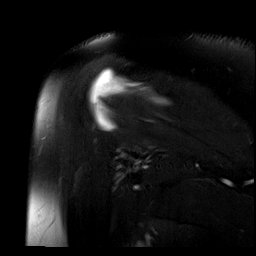

[Series 8: T2 fat-sat · oblique · 4.0mm · 0.55mm/px · 9 of 18 slices shown (2 of 2)]
[im 1/18]
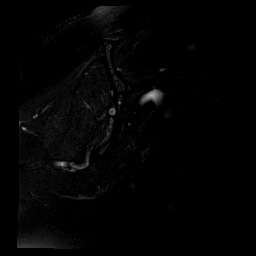
[im 3/18]
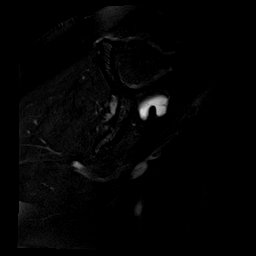
[im 5/18]
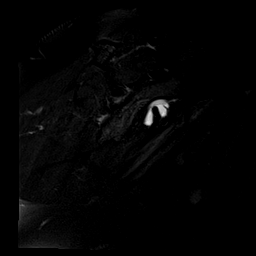
[im 7/18]
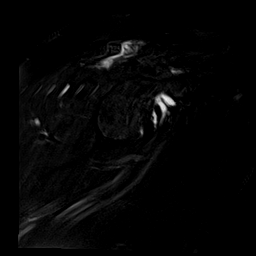
[im 9/18]
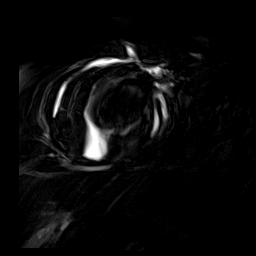
[im 11/18]
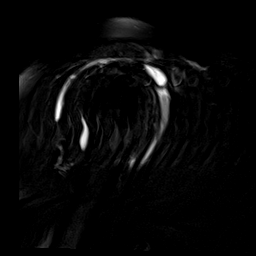
[im 13/18]
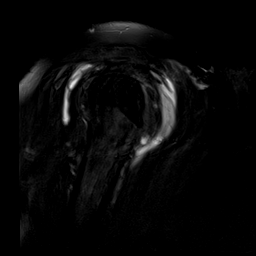
[im 15/18]
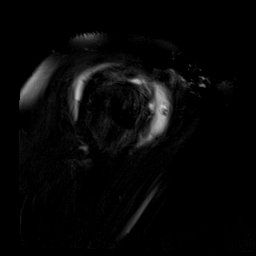
[im 18/18]
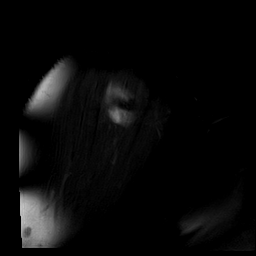

[40 of 40 positions shown; findings below may reference images not displayed]

FINDINGS: Rotator cuff: Full-thickness tear of the supraspinatus with a 13 mm
gap. This extends from the anterior to the very posterior
supraspinatus where there is a large articular surface partial tear.
Large articular surface partial tear extends to the superior
infraspinatus. Tear of the subscapularis. There is inferior
delamination of the supraspinatus.

Muscles: No cuff muscle atrophy.

Biceps long head: Long head biceps tendon dislocated medially. The
distal tendon is in the bicipital groove.

Acromioclavicular Joint: No significant arthropathy.

Glenohumeral Joint: No degenerative changes.  No subluxation.

Labrum: Labrum intact.

Bones: No fracture.
IMPRESSION: Massive rotator cuff tear involving nearly the entire supraspinatus
and the subscapularis. Retraction. No cuff muscle atrophy.

Medial dislocation of the biceps tendon.

No superior migration of the humeral head.

## 2021-08-13 ENCOUNTER — Other Ambulatory Visit (HOSPITAL_COMMUNITY): Payer: Self-pay

## 2021-08-13 MED ORDER — EZETIMIBE 10 MG PO TABS
10.0000 mg | ORAL_TABLET | Freq: Every day | ORAL | 3 refills | Status: DC
  Filled 2021-08-13: qty 90, 90d supply, fill #0
  Filled 2021-11-02: qty 90, 90d supply, fill #1
  Filled 2022-02-21: qty 90, 90d supply, fill #2
  Filled 2022-05-24: qty 90, 90d supply, fill #3

## 2021-08-20 ENCOUNTER — Ambulatory Visit (INDEPENDENT_AMBULATORY_CARE_PROVIDER_SITE_OTHER): Payer: 59

## 2021-08-20 DIAGNOSIS — I495 Sick sinus syndrome: Secondary | ICD-10-CM | POA: Diagnosis not present

## 2021-08-22 ENCOUNTER — Other Ambulatory Visit (HOSPITAL_COMMUNITY): Payer: Self-pay

## 2021-08-23 LAB — CUP PACEART REMOTE DEVICE CHECK
Date Time Interrogation Session: 20220929000700
Implantable Pulse Generator Implant Date: 20210730

## 2021-08-27 NOTE — Progress Notes (Signed)
Carelink Summary Report / Loop Recorder 

## 2021-08-28 ENCOUNTER — Other Ambulatory Visit (HOSPITAL_COMMUNITY): Payer: Self-pay

## 2021-09-05 ENCOUNTER — Other Ambulatory Visit (HOSPITAL_COMMUNITY): Payer: Self-pay

## 2021-09-05 MED FILL — Losartan Potassium Tab 100 MG: ORAL | 30 days supply | Qty: 30 | Fill #6 | Status: AC

## 2021-09-06 ENCOUNTER — Other Ambulatory Visit (HOSPITAL_COMMUNITY): Payer: Self-pay

## 2021-09-07 ENCOUNTER — Other Ambulatory Visit (HOSPITAL_COMMUNITY): Payer: Self-pay

## 2021-09-19 ENCOUNTER — Other Ambulatory Visit (HOSPITAL_COMMUNITY): Payer: Self-pay

## 2021-09-20 ENCOUNTER — Ambulatory Visit (INDEPENDENT_AMBULATORY_CARE_PROVIDER_SITE_OTHER): Payer: 59

## 2021-09-20 DIAGNOSIS — I495 Sick sinus syndrome: Secondary | ICD-10-CM

## 2021-09-25 LAB — CUP PACEART REMOTE DEVICE CHECK
Date Time Interrogation Session: 20221101000209
Implantable Pulse Generator Implant Date: 20210730

## 2021-09-27 NOTE — Progress Notes (Signed)
Carelink Summary Report / Loop Recorder 

## 2021-10-02 ENCOUNTER — Other Ambulatory Visit (HOSPITAL_COMMUNITY): Payer: Self-pay

## 2021-10-02 MED FILL — Losartan Potassium Tab 100 MG: ORAL | 30 days supply | Qty: 30 | Fill #7 | Status: AC

## 2021-10-12 ENCOUNTER — Other Ambulatory Visit (HOSPITAL_COMMUNITY): Payer: Self-pay

## 2021-10-15 ENCOUNTER — Other Ambulatory Visit (HOSPITAL_COMMUNITY): Payer: Self-pay

## 2021-10-19 ENCOUNTER — Other Ambulatory Visit (HOSPITAL_COMMUNITY): Payer: Self-pay

## 2021-10-22 ENCOUNTER — Ambulatory Visit (INDEPENDENT_AMBULATORY_CARE_PROVIDER_SITE_OTHER): Payer: 59

## 2021-10-22 ENCOUNTER — Other Ambulatory Visit (HOSPITAL_COMMUNITY): Payer: Self-pay

## 2021-10-22 DIAGNOSIS — I63411 Cerebral infarction due to embolism of right middle cerebral artery: Secondary | ICD-10-CM

## 2021-10-22 MED ORDER — DULOXETINE HCL 30 MG PO CPEP
30.0000 mg | ORAL_CAPSULE | Freq: Every day | ORAL | 3 refills | Status: DC
  Filled 2021-10-22: qty 90, 90d supply, fill #0
  Filled 2022-01-20: qty 90, 90d supply, fill #1
  Filled 2022-04-18: qty 90, 90d supply, fill #2
  Filled 2022-07-15: qty 90, 90d supply, fill #3

## 2021-10-25 DIAGNOSIS — Z1231 Encounter for screening mammogram for malignant neoplasm of breast: Secondary | ICD-10-CM | POA: Diagnosis not present

## 2021-10-29 ENCOUNTER — Other Ambulatory Visit (HOSPITAL_COMMUNITY): Payer: Self-pay

## 2021-10-29 LAB — CUP PACEART REMOTE DEVICE CHECK
Date Time Interrogation Session: 20221204000816
Implantable Pulse Generator Implant Date: 20210730

## 2021-10-30 ENCOUNTER — Encounter (HOSPITAL_BASED_OUTPATIENT_CLINIC_OR_DEPARTMENT_OTHER): Payer: Self-pay | Admitting: Obstetrics & Gynecology

## 2021-10-30 NOTE — Progress Notes (Signed)
Carelink Summary Report / Loop Recorder 

## 2021-11-02 ENCOUNTER — Other Ambulatory Visit (HOSPITAL_COMMUNITY): Payer: Self-pay

## 2021-11-05 ENCOUNTER — Other Ambulatory Visit (HOSPITAL_COMMUNITY): Payer: Self-pay

## 2021-11-06 ENCOUNTER — Other Ambulatory Visit (HOSPITAL_COMMUNITY): Payer: Self-pay

## 2021-11-06 MED ORDER — LOSARTAN POTASSIUM 100 MG PO TABS
100.0000 mg | ORAL_TABLET | Freq: Every day | ORAL | 3 refills | Status: DC
  Filled 2021-11-06: qty 90, 90d supply, fill #0
  Filled 2022-01-28: qty 90, 90d supply, fill #1
  Filled 2022-04-28: qty 90, 90d supply, fill #2
  Filled 2022-07-31: qty 90, 90d supply, fill #3

## 2021-11-06 MED ORDER — OMEPRAZOLE 40 MG PO CPDR
40.0000 mg | DELAYED_RELEASE_CAPSULE | Freq: Two times a day (BID) | ORAL | 3 refills | Status: DC
  Filled 2021-11-06: qty 180, 90d supply, fill #0
  Filled 2022-01-28: qty 180, 90d supply, fill #1
  Filled 2022-04-28: qty 180, 90d supply, fill #2
  Filled 2022-07-15: qty 180, 90d supply, fill #3

## 2021-11-07 ENCOUNTER — Other Ambulatory Visit (HOSPITAL_COMMUNITY): Payer: Self-pay

## 2021-11-14 ENCOUNTER — Other Ambulatory Visit (HOSPITAL_COMMUNITY): Payer: Self-pay

## 2021-11-14 MED ORDER — PRAMIPEXOLE DIHYDROCHLORIDE 0.125 MG PO TABS
0.1250 mg | ORAL_TABLET | Freq: Every evening | ORAL | 6 refills | Status: DC
  Filled 2021-11-14: qty 30, 30d supply, fill #0
  Filled 2021-12-09: qty 30, 30d supply, fill #1
  Filled 2022-01-07: qty 30, 30d supply, fill #2
  Filled 2022-02-04: qty 30, 30d supply, fill #3
  Filled 2022-02-28: qty 30, 30d supply, fill #4
  Filled 2022-04-02: qty 30, 30d supply, fill #5
  Filled 2022-04-28: qty 30, 30d supply, fill #6

## 2021-11-14 MED ORDER — PRAMIPEXOLE DIHYDROCHLORIDE 0.125 MG PO TABS: 0.1250 mg | ORAL_TABLET | Freq: Every day | ORAL | 6 refills | Status: AC

## 2021-11-22 ENCOUNTER — Ambulatory Visit (INDEPENDENT_AMBULATORY_CARE_PROVIDER_SITE_OTHER): Payer: 59

## 2021-11-22 DIAGNOSIS — I495 Sick sinus syndrome: Secondary | ICD-10-CM

## 2021-11-22 LAB — CUP PACEART REMOTE DEVICE CHECK
Date Time Interrogation Session: 20221229000455
Implantable Pulse Generator Implant Date: 20210730

## 2021-11-27 ENCOUNTER — Other Ambulatory Visit (HOSPITAL_COMMUNITY): Payer: Self-pay

## 2021-11-27 MED ORDER — LEVOTHYROXINE SODIUM 75 MCG PO TABS
75.0000 ug | ORAL_TABLET | ORAL | 1 refills | Status: DC
  Filled 2021-11-27 – 2021-12-09 (×2): qty 78, 84d supply, fill #0
  Filled 2022-02-28: qty 78, 84d supply, fill #1
  Filled 2022-05-24: qty 78, 84d supply, fill #2

## 2021-12-04 NOTE — Progress Notes (Signed)
Carelink Summary Report / Loop Recorder 

## 2021-12-06 ENCOUNTER — Other Ambulatory Visit (HOSPITAL_COMMUNITY): Payer: Self-pay

## 2021-12-10 ENCOUNTER — Other Ambulatory Visit (HOSPITAL_COMMUNITY): Payer: Self-pay

## 2021-12-25 ENCOUNTER — Ambulatory Visit (INDEPENDENT_AMBULATORY_CARE_PROVIDER_SITE_OTHER): Payer: 59

## 2021-12-25 DIAGNOSIS — I495 Sick sinus syndrome: Secondary | ICD-10-CM

## 2021-12-25 LAB — CUP PACEART REMOTE DEVICE CHECK
Date Time Interrogation Session: 20230131000502
Implantable Pulse Generator Implant Date: 20210730

## 2022-01-02 NOTE — Progress Notes (Signed)
Carelink Summary Report / Loop Recorder 

## 2022-01-07 ENCOUNTER — Other Ambulatory Visit (HOSPITAL_COMMUNITY): Payer: Self-pay

## 2022-01-09 ENCOUNTER — Other Ambulatory Visit (HOSPITAL_COMMUNITY): Payer: Self-pay

## 2022-01-09 MED ORDER — DOXYCYCLINE MONOHYDRATE 100 MG PO CAPS
100.0000 mg | ORAL_CAPSULE | Freq: Two times a day (BID) | ORAL | 0 refills | Status: DC
  Filled 2022-01-09: qty 14, 7d supply, fill #0

## 2022-01-21 ENCOUNTER — Other Ambulatory Visit (HOSPITAL_COMMUNITY): Payer: Self-pay

## 2022-01-28 ENCOUNTER — Ambulatory Visit (INDEPENDENT_AMBULATORY_CARE_PROVIDER_SITE_OTHER): Payer: 59

## 2022-01-28 DIAGNOSIS — I495 Sick sinus syndrome: Secondary | ICD-10-CM

## 2022-01-29 ENCOUNTER — Other Ambulatory Visit (HOSPITAL_COMMUNITY): Payer: Self-pay

## 2022-01-29 LAB — CUP PACEART REMOTE DEVICE CHECK
Date Time Interrogation Session: 20230305000334
Implantable Pulse Generator Implant Date: 20210730

## 2022-02-05 ENCOUNTER — Other Ambulatory Visit (HOSPITAL_COMMUNITY): Payer: Self-pay

## 2022-02-08 NOTE — Progress Notes (Signed)
Carelink Summary Report / Loop Recorder 

## 2022-02-21 ENCOUNTER — Other Ambulatory Visit (HOSPITAL_COMMUNITY): Payer: Self-pay

## 2022-02-26 ENCOUNTER — Encounter: Payer: Self-pay | Admitting: Emergency Medicine

## 2022-02-26 ENCOUNTER — Ambulatory Visit
Admission: RE | Admit: 2022-02-26 | Discharge: 2022-02-26 | Disposition: A | Discharge status: Home / Self Care | Payer: 59 | Source: Ambulatory Visit | Attending: Internal Medicine | Admitting: Internal Medicine

## 2022-02-26 ENCOUNTER — Other Ambulatory Visit (HOSPITAL_COMMUNITY): Payer: Self-pay

## 2022-02-26 ENCOUNTER — Ambulatory Visit
Admission: EM | Admit: 2022-02-26 | Discharge: 2022-02-26 | Disposition: A | Discharge status: Home / Self Care | Payer: 59 | Attending: Internal Medicine | Admitting: Internal Medicine

## 2022-02-26 DIAGNOSIS — M25551 Pain in right hip: Secondary | ICD-10-CM | POA: Diagnosis not present

## 2022-02-26 MED ORDER — PREDNISONE 10 MG PO TABS
20.0000 mg | ORAL_TABLET | Freq: Every day | ORAL | 0 refills | Status: DC
  Filled 2022-02-26: qty 10, 5d supply, fill #0

## 2022-02-26 NOTE — Discharge Instructions (Signed)
An x-ray has been ordered for you to have completed at [facility].  Please go there today and present x-ray ordered.  We will call with results.  Please follow-up with orthopedist for further evaluation and management. ?

## 2022-02-26 NOTE — ED Triage Notes (Signed)
Patient c/o right hip pain intermittent x 2 months but pain worsened yesterday.  Pain radiates down right leg, unable to sleep.  Patient has taken Aleve, Ibuprofen, Bengay w/o much relief. ?

## 2022-02-26 NOTE — ED Provider Notes (Signed)
?[place] ? ? ? ?CSN: [id number] ?Arrival date & time: 02/26/22  0827 ? ? ?  ? ?History   ?Chief Complaint ?Chief Complaint  ?Patient presents with  ? Hip Pain  ? ? ?HPI ?[name] is a 61 y.o. female.  ? ?Patient presents with right hip pain that has been intermittent over the past 2 months.  She reports that the pain has worsened over the past few days.  Pain starts at the right lateral hip and radiates down the entirety of the leg.  Denies any numbness or tingling.  Patient does report that she was having some lower back pain prior to hip pain starting that has now resolved.  She does have a history of spinal stenosis.  Patient has taken Aleve and used Bengay with no improvement in symptoms.  Pain is worsened with movement and with ambulating.  Denies any history of chronic pain or any recent injuries. ? ? ?Hip Pain ? ? ?Past Medical History:  ?Diagnosis Date  ? Duodenitis without hemorrhage   ? Esophageal stricture   ? Esophagitis   ? Fibromyalgia   ? GERD (gastroesophageal reflux disease)   ? Hiatal hernia   ? History of anemia 2007  ? due to fibroids  ? History of loop recorder 06/23/2020  ? had CVA 06-21-20  ? Hyperlipidemia   ? Hypertension   ? Hypothyroidism   ? Leiomyoma of uterus, unspecified   ? Pneumonia   ? aspiration pneumonia after balloon surgery for weight loss  ? Stroke ([place]) 06/21/2020  ? Right MCA infarct-recieved TPA  ? ? ?Patient Active Problem List  ? Diagnosis Date Noted  ? Traumatic complete tear of left rotator cuff   ? Biceps tendonitis on left   ? Essential hypertension 06/23/2020  ? Prediabetes 06/23/2020  ? Family hx-stroke 06/23/2020  ? Restless legs syndrome (RLS) 06/23/2020  ? 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 Obesity (BMI 30.0-34.9) 09/16/2012  ? Hyperlipidemia LDL goal <70 09/06/2008  ? FIBROMYALGIA 09/06/2008  ? HEADACHE, CHRONIC 09/06/2008  ? FIBROIDS, UTERUS 09/01/2008  ? Hypothyroidism 09/01/2008  ? GERD 09/01/2008   ? ? ?Past Surgical History:  ?Procedure Laterality Date  ? ABDOMINAL HYSTERECTOMY    ? BICEPT TENODESIS Right 12/07/2019  ? Procedure: BICEPS TENODESIS;  Surgeon: Meredith Pel, MD;  Location: [place];  Service: Orthopedics;  Laterality: Right;  ? BUBBLE STUDY  06/23/2020  ? Procedure: BUBBLE STUDY;  Surgeon: Donato Heinz, MD;  Location: [place];  Service: Cardiovascular;;  ? [place]  2014  ? right wrist  ? CHOLECYSTECTOMY    ? HYSTEROSCOPY W/ ENDOMETRIAL ABLATION  4/05  ? LOOP RECORDER INSERTION N/A 06/23/2020  ? Procedure: LOOP RECORDER INSERTION;  Surgeon: Constance Haw, MD;  Location: [place] [facility];  Service: Cardiovascular;  Laterality: N/A;  ? RECTOCELE REPAIR  2007  ? SHOULDER ARTHROSCOPY WITH OPEN ROTATOR CUFF REPAIR Right 12/07/2019  ? Procedure: RIGHT SHOULDER ARTHROSCOPY,   MINI OPEN SUBSCAPULARIS, AND SUPRASPINATUS REPAIR, OPEN BICEPS TENODESIS;  Surgeon: Meredith Pel, MD;  Location: [place];  Service: Orthopedics;  Laterality: Right;  ? SHOULDER ARTHROSCOPY WITH ROTATOR CUFF REPAIR AND OPEN BICEPS TENODESIS Left 01/22/2021  ? Procedure: left shoulder arthroscopy debridement, mini open rotator cuff tear repair, biceps tenodesis;  Surgeon: Meredith Pel, MD;  Location: [place];  Service: Orthopedics;  Laterality: Left;  NEEDS RNFA PLEASE  ? TEE  WITHOUT CARDIOVERSION N/A 06/23/2020  ? Procedure: TRANSESOPHAGEAL ECHOCARDIOGRAM (TEE);  Surgeon: Donato Heinz, MD;  Location[facility] ENDOSCOPY;  Service: Cardiovascular;  Laterality: N/A;  ? TONSILLECTOMY    ? as child  ? TOTAL VAGINAL HYSTERECTOMY  5/07  ? TUBAL LIGATION  1991  ? BILATERAL  ? ? ?OB History   ? ? Gravida  ?3  ? Para  ?3  ? Term  ?3  ? Preterm  ?0  ? AB  ?0  ? Living  ?3  ?  ? ? SAB  ?0  ? IAB  ?0  ? Ectopic  ?0  ? Multiple  ?0  ? Live Births  ?3  ?   ?  ?  ? ? ? ?Home Medications   ? ?Prior to Admission medications   ?Medication  Sig Start Date End Date Taking? Authorizing Provider  ?acyclovir (ZOVIRAX) 400 MG tablet Take 1 tablet (400 mg total) by mouth 2 (two) times daily. 06/13/21  Yes Megan Salon, MD  ?aspirin EC 81 MG EC tablet Take 1 tablet (81 mg total) by mouth daily. Swallow whole. 06/24/20  Yes Donzetta Starch, NP  ?cephALEXin (KEFLEX) 500 MG capsule Take 1 capsule (500 mg total) by mouth 2 (two) times daily. 08/04/21  Yes Vanessa Kick, MD  ?chlorthalidone (HYGROTON) 25 MG tablet Take 1 tablet (25 mg total) by mouth in the morning. 07/25/21  Yes   ?Cholecalciferol (VITAMIN D) 2000 units tablet Take 2,000 Units by mouth 2 (two) times daily.   Yes [provider]  ?doxycycline (MONODOX) 100 MG capsule Take 1 capsule (100 mg total) by mouth 2 (two) times daily for 7 days 01/09/22  Yes   ?DULoxetine (CYMBALTA) 30 MG capsule Take 1 capsule (30 mg total) by mouth daily. 10/22/21  Yes   ?Erenumab-aooe (AIMOVIG) 140 MG/ML SOAJ Inject 140 mg into the skin every 30 (thirty) days. 05/09/21  Yes   ?ezetimibe (ZETIA) 10 MG tablet Take 1 tablet (10 mg total) by mouth daily. 08/13/21  Yes   ?levothyroxine (SYNTHROID) 75 MCG tablet Take 1 tablet (75 mcg total) by mouth daily for 6 days and 1/2 (one-half) a tablet by mouth for one day a week. 11/27/21  Yes   ?losartan (COZAAR) 100 MG tablet Take 1 tablet (100 mg total) by mouth daily. 11/06/21  Yes   ?Melatonin 5 MG CAPS Take 5 mg by mouth at bedtime.   Yes [provider]  ?Multiple Vitamin (MULTIVITAMIN) capsule Take 1 capsule by mouth daily.   Yes [provider]  ?omeprazole (PRILOSEC) 40 MG capsule Take 1 capsule (40 mg total) by mouth 2 (two) times daily. 11/06/21  Yes   ?phenazopyridine (PYRIDIUM) 200 MG tablet Take 1 tablet (200 mg total) by mouth 3 (three) times daily. 08/04/21  Yes Vanessa Kick, MD  ?pramipexole (MIRAPEX) 0.125 MG tablet Take 1 tablet (0.125 mg total) by mouth 2 to 3 hours prior to bedtime. 11/14/21  Yes   ?pramipexole (MIRAPEX) 0.125 MG tablet Take 1  tablet (0.125 mg total) by mouth 2 to 3 hours prior to bedtime. 11/14/21  Yes   ?predniSONE (DELTASONE) 10 MG tablet Take 2 tablets (20 mg total) by mouth daily for 5 days 02/26/22  Yes Teodora Medici, FNP  ?rosuvastatin (CRESTOR) 20 MG tablet Take 1 tablet by mouth once daily. 07/13/21  Yes   ?vitamin B-12 (CYANOCOBALAMIN) 100 MCG tablet Take 100 mcg by mouth daily.   Yes [provider]  ?chlorthalidone (HYGROTON) 25 MG  tablet Take 1 tablet (25 mg total) by mouth every morning. 04/24/21     ?DULoxetine (CYMBALTA) 30 MG capsule TAKE 1 CAPSULE BY MOUTH DAILY 01/11/21 01/11/22  Crist Infante, MD  ?omeprazole (PRILOSEC) 40 MG capsule TAKE 1 CAPSULE BY MOUTH TWICE DAILY. 02/15/21 02/15/22  Crist Infante, MD  ?pramipexole (MIRAPEX) 0.125 MG tablet TAKE 1 TABLET BY MOUTH 2 TO 3 HOURS PRIOR TO BEDTIME 05/05/20 05/05/21  Crist Infante, MD  ? ? ?Family History ?Family History  ?Problem Relation Age of Onset  ? Celiac disease Mother   ? Osteoporosis Mother   ? Heart disease Father   ? Stroke Father   ? Irritable bowel syndrome Sister   ? Lung cancer Maternal Grandmother   ? Breast cancer Paternal Grandmother   ? Heart disease Sister   ? Melanoma Sister   ? Colon cancer Neg Hx   ? Esophageal cancer Neg Hx   ? Stomach cancer Neg Hx   ? Rectal cancer Neg Hx   ? ? ?Social History ?Social History  ? ?Tobacco Use  ? Smoking status: Former  ?  Types: Cigarettes  ?  Quit date: 12/23/2002  ?  Years since quitting: 19.1  ? Smokeless tobacco: Never  ?Vaping Use  ? Vaping Use: Never used  ?Substance Use Topics  ? Alcohol use: Not Currently  ? Drug use: No  ? ? ? ?Allergies   ?Patient has no known allergies. ? ? ?Review of Systems ?Review of Systems ?Per HPI ? ?Physical Exam ?Triage Vital Signs ?ED Triage Vitals  ?Enc Vitals Group  ?   BP 02/26/22 0858 118/81  ?   Pulse Rate 02/26/22 0858 87  ?   Resp 02/26/22 0858 18  ?   Temp 02/26/22 0858 98.1 ?F (36.7 ?C)  ?   Temp Source 02/26/22 0858 Oral  ?   SpO2 02/26/22 0858 98 %  ?   Weight  02/26/22 0859 176 lb 5.9 oz (80 kg)  ?   Height 02/26/22 0859 5' 4.5" (1.638 m)  ?   Head Circumference --   ?   Peak Flow --   ?   Pain Score 02/26/22 0859 7  ?   Pain Loc --   ?   Pain Edu? --   ?   Excl. in [facility]

## 2022-02-27 ENCOUNTER — Other Ambulatory Visit (HOSPITAL_COMMUNITY): Payer: Self-pay

## 2022-02-27 ENCOUNTER — Encounter: Payer: Self-pay | Admitting: Orthopedic Surgery

## 2022-02-27 DIAGNOSIS — M461 Sacroiliitis, not elsewhere classified: Secondary | ICD-10-CM | POA: Diagnosis not present

## 2022-02-27 DIAGNOSIS — M5416 Radiculopathy, lumbar region: Secondary | ICD-10-CM | POA: Diagnosis not present

## 2022-02-27 MED ORDER — PREDNISONE 10 MG (21) PO TBPK
ORAL_TABLET | ORAL | 0 refills | Status: DC
  Filled 2022-02-27: qty 21, 6d supply, fill #0

## 2022-02-27 MED ORDER — METHOCARBAMOL 500 MG PO TABS
500.0000 mg | ORAL_TABLET | Freq: Four times a day (QID) | ORAL | 0 refills | Status: DC
  Filled 2022-02-27: qty 40, 10d supply, fill #0

## 2022-02-28 ENCOUNTER — Other Ambulatory Visit (HOSPITAL_COMMUNITY): Payer: Self-pay

## 2022-03-01 ENCOUNTER — Other Ambulatory Visit (HOSPITAL_COMMUNITY): Payer: Self-pay

## 2022-03-01 NOTE — Progress Notes (Signed)
Attempted to call patient to notify of results. No answer. Patient will need to follow up with orthopedist for further evaluation and management.

## 2022-03-04 ENCOUNTER — Other Ambulatory Visit (HOSPITAL_COMMUNITY): Payer: Self-pay

## 2022-03-04 ENCOUNTER — Ambulatory Visit (INDEPENDENT_AMBULATORY_CARE_PROVIDER_SITE_OTHER): Payer: 59

## 2022-03-04 DIAGNOSIS — I495 Sick sinus syndrome: Secondary | ICD-10-CM | POA: Diagnosis not present

## 2022-03-06 ENCOUNTER — Telehealth (HOSPITAL_COMMUNITY): Payer: Self-pay | Admitting: Emergency Medicine

## 2022-03-06 ENCOUNTER — Telehealth: Payer: Self-pay

## 2022-03-06 LAB — CUP PACEART REMOTE DEVICE CHECK
Date Time Interrogation Session: 20230410001127
Implantable Pulse Generator Implant Date: 20210730

## 2022-03-06 NOTE — Telephone Encounter (Signed)
Attempted to call and check on patient, per Hildred Alamin, APP 's request.  LVM ?

## 2022-03-06 NOTE — Telephone Encounter (Signed)
I think she is got it coming on.  Looks like she needs an MRI of the pelvis because of her right-sided SI joint inflammation.  Not really sure where that is from.  Doubt that it is her hip based on available information but again unlikely MRI scan will be approved without some type of clinical information.  She could request that the person who is actually seen her for her hip and sacroiliitis order MRI of pelvis to evaluate both at the same time.  Otherwise she will need to make an appointment so I can see her and then try to get the MRI scan approved.  Me ordering it without having seen her will likely not be approved.

## 2022-03-06 NOTE — Telephone Encounter (Signed)
Dr Marlou Sa please see below patient sent via mychart about getting an MRI. This is a new problem.  ? ? ? ?I?ve already had an XRay done and taken a prednisone pack.  ?  ? ?[name]  Patient Appointment Schedule Request Pool 1 hour ago (2:54 PM)  ? ?Ok. I?m sorry to keep bugging you but my right hip and leg is just not feeling right and I feel like I could be pinching a nerve or something. I can?t even walk right. Do you think Dr Marlou Sa would order an MRI of my hip so that could be in the works? ?

## 2022-03-07 NOTE — Telephone Encounter (Signed)
IC spoke with patient and advised. She verbalized understanding. She will have scan ordered elsewhere and follow up with Dr Marlou Sa.  ?

## 2022-03-13 ENCOUNTER — Ambulatory Visit: Payer: 59 | Admitting: Surgical

## 2022-03-13 ENCOUNTER — Ambulatory Visit: Payer: Self-pay

## 2022-03-13 ENCOUNTER — Encounter: Payer: Self-pay | Admitting: Orthopedic Surgery

## 2022-03-13 DIAGNOSIS — M25551 Pain in right hip: Secondary | ICD-10-CM

## 2022-03-13 DIAGNOSIS — M5410 Radiculopathy, site unspecified: Secondary | ICD-10-CM | POA: Diagnosis not present

## 2022-03-13 MED ORDER — BUPIVACAINE HCL 0.25 % IJ SOLN
4.0000 mL | INTRAMUSCULAR | Status: AC | PRN
  Administered 2022-03-13: 4 mL via INTRA_ARTICULAR

## 2022-03-13 MED ORDER — LIDOCAINE HCL 1 % IJ SOLN
5.0000 mL | INTRAMUSCULAR | Status: AC | PRN
  Administered 2022-03-13: 5 mL

## 2022-03-13 MED ORDER — METHYLPREDNISOLONE ACETATE 40 MG/ML IJ SUSP
40.0000 mg | INTRAMUSCULAR | Status: AC | PRN
  Administered 2022-03-13: 40 mg via INTRA_ARTICULAR

## 2022-03-13 NOTE — Progress Notes (Signed)
? ?Office Visit Note ?  ?Patient: [name]           ?Date of Birth: 14-Jan-1961           ?MRN: [id number] ?Visit Date: 03/13/2022 ?Requested by: Crist Infante, MD ?[address] ?[place],  [place] [address] ?PCP: Crist Infante, MD ? ?Subjective: ?Chief Complaint  ?Patient presents with  ? Right Hip - Follow-up  ? ? ?HPI: Alysen Smylie is a 61 y.o. female who presents to the office complaining of right hip pain.  Patient states that she began to have right-sided low back pain with radicular pain down her right leg about 4 weeks ago.  This is typical for her at times and occasionally flares up.  However, after 1 to 2 weeks, she began to notice severely worsening buttock and hip pain that was worse than her typical sciatic pain.  She had to miss 4 days of work which is unusual for her.  She has radicular pain down from her buttocks into the bottom of her right foot.  No left-sided symptoms.  She has occasional numbness in her anterior shin.  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  She denies any hip or spine surgery.  No numbness or tingling aside from the anterior shin.  No groin pain.  She was seen in urgent care with radiographs of the right hip demonstrating no significant arthritis of the right hip but did show moderate to severe osteoarthritis of the right sacroiliac joint.  She denies any history of injury or fall.  Urgent care prescribed prednisone with temporary relief of symptoms but they have been returning recently. ?             ?ROS: All systems reviewed are negative as they relate to the chief complaint within the history of present illness.  Patient denies fevers or chills. ? ?Assessment & Plan: ?Visit Diagnoses:  ?1. Radicular syndrome of right leg   ?2. Pain in right hip   ?3. Greater trochanteric pain syndrome of right lower extremity   ? ? ?Plan: Patient is a 61 year old female who presents complaining of right  hip pain.  She has radicular pain traveling from the right buttock down to the bottom of the right foot.  She notes subjective weakness and fatigue with long walking particularly in the buttocks region.  There is weakness on exam today primarily of hamstring and dorsiflexion and EHL.  She also has tenderness over the greater trochanter.  Discussed options available patient.  After discussion, patient would like to try greater trochanter injection today with MRI of the lumbar spine for further evaluation of radicular pain.  This is indicated with failure of conservative management and the weakness and decreased reflexes that are present on exam today.  She will follow-up after MRI to review results.  Anticipate that she may get some relief from the trochanteric injection but likely not on a percent.  Ultrasound was utilized and the needle was observed in good position. ? ?Follow-Up Instructions: No follow-ups on file.  ? ?Orders:  ?Orders Placed This Encounter  ?Procedures  ? US Guided Needle Placement - No Linked Charges  ? MR Lumbar Spine w/o contrast  ? ?No orders of the defined types were placed in this encounter. ? ? ? ? Procedures: ?Large Joint Inj: R greater trochanter on 03/13/2022 5:36 PM ?Indications: pain and diagnostic evaluation ?Details: 18 G 3.5 in needle,  ultrasound-guided lateral approach ? ?Arthrogram: No ? ?Medications: 5 mL lidocaine 1 %; 4 mL bupivacaine 0.25 %; 40 mg methylPREDNISolone acetate 40 MG/ML ?Outcome: tolerated well, no immediate complications ?Procedure, treatment alternatives, risks and benefits explained, specific risks discussed. Consent was given by the patient. Immediately prior to procedure a time out was called to verify the correct patient, procedure, equipment, support staff and site/side marked as required. Patient was prepped and draped in the usual sterile fashion.  ? ? ? ? ?Clinical Data: ?No additional findings. ? ?Objective: ?Vital Signs: LMP 11/25/2005  ? ?Physical  Exam:  ?Constitutional: Patient appears well-developed ?HEENT:  ?Head: Normocephalic ?Eyes:EOM are normal ?Neck: Normal range of motion ?Cardiovascular: Normal rate ?Pulmonary/chest: Effort normal ?Neurologic: Patient is alert ?Skin: Skin is warm ?Psychiatric: Patient has normal mood and affect ? ?Ortho Exam: Ortho exam demonstrates right hip with tenderness over the greater trochanter moderate to severely.  She has positive Ober test.  Mild weakness with abduction of the right hip.  No weakness with internal rotation of the right hip.  No pain with internal rotation.  Negative Stinchfield sign.  Positive straight leg raise.  Weakness of dorsiflexion and EHL and hamstring of the right leg compared with the left rated 4/5 versus 5/5.  5/5 quad, hip flexion, plantarflexion strength.  Absent Achilles tendon reflex versus 2+ reflex on the left leg.  3+ patellar tendon reflexes bilaterally. ? ?Specialty Comments:  ?No specialty comments available. ? ?Imaging: ?No results found. ? ? ?PMFS History: ?Patient Active Problem List  ? Diagnosis Date Noted  ? Traumatic complete tear of left rotator cuff   ? Biceps tendonitis on left   ? Essential hypertension 06/23/2020  ? Prediabetes 06/23/2020  ? Family hx-stroke 06/23/2020  ? Restless legs syndrome (RLS) 06/23/2020  ? 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 Obesity (BMI 30.0-34.9) 09/16/2012  ? Hyperlipidemia LDL goal <70 09/06/2008  ? FIBROMYALGIA 09/06/2008  ? HEADACHE, CHRONIC 09/06/2008  ? FIBROIDS, UTERUS 09/01/2008  ? Hypothyroidism 09/01/2008  ? GERD 09/01/2008  ? ?Past Medical History:  ?Diagnosis Date  ? Duodenitis without hemorrhage   ? Esophageal stricture   ? Esophagitis   ? Fibromyalgia   ? GERD (gastroesophageal reflux disease)   ? Hiatal hernia   ? History of anemia 2007  ? due to fibroids  ? History of loop recorder 06/23/2020  ? had CVA 06-21-20  ? Hyperlipidemia   ? Hypertension   ? Hypothyroidism   ? Leiomyoma of uterus,  unspecified   ? Pneumonia   ? aspiration pneumonia after balloon surgery for weight loss  ? Stroke ([place]) 06/21/2020  ? Right MCA infarct-recieved TPA  ?  ?Family History  ?Problem Relation Age of Onset  ? Celiac disease Mother   ? Osteoporosis Mother   ? Heart disease Father   ? Stroke Father   ? Irritable bowel syndrome Sister   ? Lung cancer Maternal Grandmother   ? Breast cancer Paternal Grandmother   ? Heart disease Sister   ? Melanoma Sister   ? Colon cancer Neg Hx   ? Esophageal cancer Neg Hx   ? Stomach cancer Neg Hx   ? Rectal cancer Neg Hx   ?  ?Past Surgical History:  ?Procedure Laterality Date  ? ABDOMINAL HYSTERECTOMY    ? BICEPT TENODESIS Right 12/07/2019  ? Procedure: BICEPS TENODESIS;  Surgeon: Meredith Pel, MD;  Location: [place];  Service: Orthopedics;  Laterality: Right;  ? BUBBLE STUDY  06/23/2020  ? Procedure: BUBBLE STUDY;  Surgeon: Donato Heinz, MD;  Location: [place];  Service: Cardiovascular;;  ? [place]  2014  ? right wrist  ? CHOLECYSTECTOMY    ? HYSTEROSCOPY W/ ENDOMETRIAL ABLATION  4/05  ? LOOP RECORDER INSERTION N/A 06/23/2020  ? Procedure: LOOP RECORDER INSERTION;  Surgeon: Constance Haw, MD;  Location: [place] [facility];  Service: Cardiovascular;  Laterality: N/A;  ? RECTOCELE REPAIR  2007  ? SHOULDER ARTHROSCOPY WITH OPEN ROTATOR CUFF REPAIR Right 12/07/2019  ? Procedure: RIGHT SHOULDER ARTHROSCOPY,   MINI OPEN SUBSCAPULARIS, AND SUPRASPINATUS REPAIR, OPEN BICEPS TENODESIS;  Surgeon: Meredith Pel, MD;  Location: [place];  Service: Orthopedics;  Laterality: Right;  ? SHOULDER ARTHROSCOPY WITH ROTATOR CUFF REPAIR AND OPEN BICEPS TENODESIS Left 01/22/2021  ? Procedure: left shoulder arthroscopy debridement, mini open rotator cuff tear repair, biceps tenodesis;  Surgeon: Meredith Pel, MD;  Location: [place];  Service: Orthopedics;  Laterality: Left;  NEEDS RNFA PLEASE  ? TEE WITHOUT  CARDIOVERSION N/A 06/23/2020  ? Procedure: TRANSESOPHAGEAL ECHOCARDIOGRAM (TEE);  Surgeon: Donato Heinz, MD;  Location: [place];  Service: Cardiovascular;  Laterality: N/A;  ? TONSILLECTOMY

## 2022-03-17 ENCOUNTER — Ambulatory Visit
Admission: RE | Admit: 2022-03-17 | Discharge: 2022-03-17 | Disposition: A | Discharge status: Home / Self Care | Payer: 59 | Source: Ambulatory Visit | Attending: Orthopedic Surgery | Admitting: Orthopedic Surgery

## 2022-03-17 DIAGNOSIS — M5117 Intervertebral disc disorders with radiculopathy, lumbosacral region: Secondary | ICD-10-CM | POA: Diagnosis not present

## 2022-03-17 DIAGNOSIS — M4726 Other spondylosis with radiculopathy, lumbar region: Secondary | ICD-10-CM | POA: Diagnosis not present

## 2022-03-17 DIAGNOSIS — M48061 Spinal stenosis, lumbar region without neurogenic claudication: Secondary | ICD-10-CM | POA: Diagnosis not present

## 2022-03-17 DIAGNOSIS — M25551 Pain in right hip: Secondary | ICD-10-CM

## 2022-03-18 ENCOUNTER — Other Ambulatory Visit: Payer: Self-pay

## 2022-03-18 DIAGNOSIS — M5410 Radiculopathy, site unspecified: Secondary | ICD-10-CM

## 2022-03-21 NOTE — Progress Notes (Signed)
Carelink Summary Report / Loop Recorder 

## 2022-03-23 ENCOUNTER — Other Ambulatory Visit: Payer: 59

## 2022-03-25 NOTE — Progress Notes (Signed)
[name] called her and she is set up to see neurosurgery

## 2022-04-01 DIAGNOSIS — M5126 Other intervertebral disc displacement, lumbar region: Secondary | ICD-10-CM | POA: Diagnosis not present

## 2022-04-01 DIAGNOSIS — Z6832 Body mass index (BMI) 32.0-32.9, adult: Secondary | ICD-10-CM | POA: Diagnosis not present

## 2022-04-03 ENCOUNTER — Other Ambulatory Visit (HOSPITAL_COMMUNITY): Payer: Self-pay

## 2022-04-09 ENCOUNTER — Ambulatory Visit (INDEPENDENT_AMBULATORY_CARE_PROVIDER_SITE_OTHER): Payer: 59

## 2022-04-09 DIAGNOSIS — I495 Sick sinus syndrome: Secondary | ICD-10-CM | POA: Diagnosis not present

## 2022-04-11 ENCOUNTER — Other Ambulatory Visit (HOSPITAL_COMMUNITY): Payer: Self-pay

## 2022-04-11 LAB — CUP PACEART REMOTE DEVICE CHECK
Date Time Interrogation Session: 20230513000800
Implantable Pulse Generator Implant Date: 20210730

## 2022-04-12 ENCOUNTER — Other Ambulatory Visit (HOSPITAL_COMMUNITY): Payer: Self-pay

## 2022-04-15 ENCOUNTER — Other Ambulatory Visit (HOSPITAL_COMMUNITY): Payer: Self-pay

## 2022-04-15 DIAGNOSIS — M5127 Other intervertebral disc displacement, lumbosacral region: Secondary | ICD-10-CM | POA: Diagnosis not present

## 2022-04-15 DIAGNOSIS — M5126 Other intervertebral disc displacement, lumbar region: Secondary | ICD-10-CM | POA: Diagnosis not present

## 2022-04-15 HISTORY — PX: LUMBAR DISC SURGERY: SHX700

## 2022-04-15 MED ORDER — CYCLOBENZAPRINE HCL 10 MG PO TABS
10.0000 mg | ORAL_TABLET | Freq: Three times a day (TID) | ORAL | 0 refills | Status: DC | PRN
  Filled 2022-04-15: qty 50, 17d supply, fill #0

## 2022-04-15 MED ORDER — OXYCODONE-ACETAMINOPHEN 10-325 MG PO TABS
1.0000 | ORAL_TABLET | Freq: Four times a day (QID) | ORAL | 0 refills | Status: DC | PRN
  Filled 2022-04-15: qty 28, 7d supply, fill #0

## 2022-04-18 ENCOUNTER — Other Ambulatory Visit (HOSPITAL_COMMUNITY): Payer: Self-pay

## 2022-04-25 NOTE — Progress Notes (Addendum)
[facility] Summary Report / Loop Recorder 

## 2022-04-29 ENCOUNTER — Other Ambulatory Visit (HOSPITAL_COMMUNITY): Payer: Self-pay

## 2022-05-01 ENCOUNTER — Other Ambulatory Visit (HOSPITAL_COMMUNITY): Payer: Self-pay

## 2022-05-08 ENCOUNTER — Other Ambulatory Visit (HOSPITAL_COMMUNITY): Payer: Self-pay

## 2022-05-09 ENCOUNTER — Other Ambulatory Visit (HOSPITAL_COMMUNITY): Payer: Self-pay

## 2022-05-09 MED ORDER — METHOCARBAMOL 500 MG PO TABS
500.0000 mg | ORAL_TABLET | Freq: Three times a day (TID) | ORAL | 0 refills | Status: DC | PRN
Start: 2022-05-09 — End: 2023-09-26
  Filled 2022-05-09: qty 90, 30d supply, fill #0

## 2022-05-09 MED ORDER — AIMOVIG 140 MG/ML ~~LOC~~ SOAJ
140.0000 mg | SUBCUTANEOUS | 3 refills | Status: DC
  Filled 2022-05-09: qty 3, 90d supply, fill #0
  Filled 2022-07-31: qty 3, 90d supply, fill #1
  Filled 2022-10-28: qty 3, 90d supply, fill #2
  Filled 2023-01-29 – 2023-02-05 (×5): qty 3, 90d supply, fill #3

## 2022-05-13 ENCOUNTER — Ambulatory Visit (INDEPENDENT_AMBULATORY_CARE_PROVIDER_SITE_OTHER): Payer: 59

## 2022-05-13 DIAGNOSIS — I495 Sick sinus syndrome: Secondary | ICD-10-CM | POA: Diagnosis not present

## 2022-05-15 LAB — CUP PACEART REMOTE DEVICE CHECK
Date Time Interrogation Session: 20230615000543
Implantable Pulse Generator Implant Date: 20210730

## 2022-05-24 ENCOUNTER — Other Ambulatory Visit (HOSPITAL_COMMUNITY): Payer: Self-pay

## 2022-05-25 ENCOUNTER — Other Ambulatory Visit (HOSPITAL_COMMUNITY): Payer: Self-pay

## 2022-05-27 ENCOUNTER — Other Ambulatory Visit (HOSPITAL_COMMUNITY): Payer: Self-pay

## 2022-05-27 MED ORDER — PRAMIPEXOLE DIHYDROCHLORIDE 0.125 MG PO TABS
0.1250 mg | ORAL_TABLET | ORAL | 3 refills | Status: DC
  Filled 2022-05-27: qty 30, 30d supply, fill #0
  Filled 2022-06-22: qty 30, 30d supply, fill #1
  Filled 2022-07-21: qty 30, 30d supply, fill #2
  Filled 2022-08-26: qty 30, 30d supply, fill #3

## 2022-05-27 MED ORDER — LEVOTHYROXINE SODIUM 75 MCG PO TABS
ORAL_TABLET | ORAL | 2 refills | Status: DC
  Filled 2022-05-27: qty 78, 84d supply, fill #0
  Filled 2022-08-19: qty 78, 84d supply, fill #1
  Filled 2022-11-10: qty 78, 84d supply, fill #2
  Filled 2023-02-05: qty 28, 30d supply, fill #3
  Filled 2023-03-03: qty 8, 8d supply, fill #0

## 2022-05-29 ENCOUNTER — Other Ambulatory Visit (HOSPITAL_COMMUNITY): Payer: Self-pay

## 2022-05-29 MED ORDER — PRAMIPEXOLE DIHYDROCHLORIDE 0.125 MG PO TABS
0.1250 mg | ORAL_TABLET | ORAL | 1 refills | Status: DC
  Filled 2022-05-29 – 2023-03-25 (×2): qty 90, 90d supply, fill #0

## 2022-05-29 MED ORDER — PRAMIPEXOLE DIHYDROCHLORIDE 0.125 MG PO TABS
0.1250 mg | ORAL_TABLET | ORAL | 1 refills | Status: DC
  Filled 2022-05-29: qty 9, 9d supply, fill #0

## 2022-06-04 NOTE — Progress Notes (Signed)
Carelink Summary Report / Loop Recorder 

## 2022-06-17 ENCOUNTER — Ambulatory Visit (INDEPENDENT_AMBULATORY_CARE_PROVIDER_SITE_OTHER): Payer: 59

## 2022-06-17 DIAGNOSIS — I495 Sick sinus syndrome: Secondary | ICD-10-CM

## 2022-06-18 LAB — CUP PACEART REMOTE DEVICE CHECK
Date Time Interrogation Session: 20230718000942
Implantable Pulse Generator Implant Date: 20210730

## 2022-06-21 ENCOUNTER — Other Ambulatory Visit (HOSPITAL_COMMUNITY): Payer: Self-pay

## 2022-06-21 ENCOUNTER — Encounter (HOSPITAL_BASED_OUTPATIENT_CLINIC_OR_DEPARTMENT_OTHER): Payer: Self-pay | Admitting: Obstetrics & Gynecology

## 2022-06-21 ENCOUNTER — Ambulatory Visit (INDEPENDENT_AMBULATORY_CARE_PROVIDER_SITE_OTHER): Payer: 59 | Admitting: Obstetrics & Gynecology

## 2022-06-21 VITALS — BP 107/82 | HR 97 | Ht 65.0 in | Wt 200.8 lb

## 2022-06-21 DIAGNOSIS — R232 Flushing: Secondary | ICD-10-CM

## 2022-06-21 DIAGNOSIS — B009 Herpesviral infection, unspecified: Secondary | ICD-10-CM | POA: Diagnosis not present

## 2022-06-21 DIAGNOSIS — Z90710 Acquired absence of both cervix and uterus: Secondary | ICD-10-CM | POA: Diagnosis not present

## 2022-06-21 DIAGNOSIS — N8111 Cystocele, midline: Secondary | ICD-10-CM | POA: Diagnosis not present

## 2022-06-21 DIAGNOSIS — Z01419 Encounter for gynecological examination (general) (routine) without abnormal findings: Secondary | ICD-10-CM | POA: Diagnosis not present

## 2022-06-21 DIAGNOSIS — Z8673 Personal history of transient ischemic attack (TIA), and cerebral infarction without residual deficits: Secondary | ICD-10-CM | POA: Diagnosis not present

## 2022-06-21 MED ORDER — GABAPENTIN 100 MG PO CAPS
100.0000 mg | ORAL_CAPSULE | Freq: Every evening | ORAL | 1 refills | Status: DC
  Filled 2022-06-21: qty 60, 12d supply, fill #0
  Filled 2022-07-21: qty 60, 12d supply, fill #1

## 2022-06-21 MED ORDER — ACYCLOVIR 400 MG PO TABS
400.0000 mg | ORAL_TABLET | Freq: Two times a day (BID) | ORAL | 4 refills | Status: DC
  Filled 2022-06-21 – 2023-06-02 (×3): qty 180, 90d supply, fill #0

## 2022-06-21 NOTE — Progress Notes (Signed)
61 y.o. G108P3003 Married White or Caucasian female here for annual exam.  Did have lumbar discectomy in may.  Had done really well.  Had stroke--middle cerebral artery occlusion.  Had negative work up.  Loop recorder is still place.    Denies vaginal bleeding.  Does need acyclovir for refill.  Continues to have hot flashes.  Options for treatment discussed.    Patient's last menstrual period was 11/25/2005.          Sexually active: Yes.    The current method of family planning is status post hysterectomy.    Smoker:  no  Health Maintenance: Pap:  04/10/2017 Negative History of abnormal Pap:  no MMG:  10/25/2021 Negtaive Colonoscopy:  10/10/2017, Dr. Henrene Pastor, follow up 10 years BMD:   has done with Dr. Joylene Draft.  Pt reports this was normal.   Screening Labs: remote hx   reports that she quit smoking about 19 years ago. Her smoking use included cigarettes. She has never used smokeless tobacco. She reports that she does not currently use alcohol. She reports that she does not use drugs.  Past Medical History:  Diagnosis Date   Duodenitis without hemorrhage    Esophageal stricture    Esophagitis    Fibromyalgia    GERD (gastroesophageal reflux disease)    Hiatal hernia    History of anemia 2007   due to fibroids   History of loop recorder 06/23/2020   had CVA 06-21-20   Hyperlipidemia    Hypertension    Hypothyroidism    Leiomyoma of uterus, unspecified    Pneumonia    aspiration pneumonia after balloon surgery for weight loss   Stroke ([place]) 06/21/2020   Right MCA infarct-recieved TPA    Past Surgical History:  Procedure Laterality Date   BICEPT TENODESIS Right 12/07/2019   Procedure: BICEPS TENODESIS;  Surgeon: Meredith Pel, MD;  Location: [place];  Service: Orthopedics;  Laterality: Right;   BUBBLE STUDY  06/23/2020   Procedure: BUBBLE STUDY;  Surgeon: Donato Heinz, MD;  Location: [facility] ENDOSCOPY;  Service: Cardiovascular;;   CARPAL TUNNEL  RELEASE  2014   right wrist   CHOLECYSTECTOMY     HYSTEROSCOPY W/ ENDOMETRIAL ABLATION  02/2004   LOOP RECORDER INSERTION N/A 06/23/2020   Procedure: LOOP RECORDER INSERTION;  Surgeon: Constance Haw, MD;  Location: [place] [facility];  Service: Cardiovascular;  Laterality: N/A;   LUMBAR [place] SURGERY  04/15/2022   [place] [facility]   RECTOCELE REPAIR  2007   SHOULDER ARTHROSCOPY WITH OPEN ROTATOR CUFF REPAIR Right 12/07/2019   Procedure: RIGHT SHOULDER ARTHROSCOPY,   MINI OPEN SUBSCAPULARIS, AND SUPRASPINATUS REPAIR, OPEN BICEPS TENODESIS;  Surgeon: Meredith Pel, MD;  Location: [place];  Service: Orthopedics;  Laterality: Right;   SHOULDER ARTHROSCOPY WITH ROTATOR CUFF REPAIR AND OPEN BICEPS TENODESIS Left 01/22/2021   Procedure: left shoulder arthroscopy debridement, mini open rotator cuff tear repair, biceps tenodesis;  Surgeon: Meredith Pel, MD;  Location: [place];  Service: Orthopedics;  Laterality: Left;  NEEDS RNFA PLEASE   TEE WITHOUT CARDIOVERSION N/A 06/23/2020   Procedure: TRANSESOPHAGEAL ECHOCARDIOGRAM (TEE);  Surgeon: Donato Heinz, MD;  Location: [place];  Service: Cardiovascular;  Laterality: N/A;   TONSILLECTOMY     as child   TOTAL VAGINAL HYSTERECTOMY  03/2006   TUBAL LIGATION  1991   BILATERAL    Current Outpatient Medications  Medication Sig Dispense Refill   acyclovir (ZOVIRAX) 400 MG tablet Take  1 tablet (400 mg total) by mouth 2 (two) times daily. 180 tablet 4   aspirin EC 81 MG EC tablet Take 1 tablet (81 mg total) by mouth daily. Swallow whole. 30 tablet 11   cephALEXin (KEFLEX) 500 MG capsule Take 1 capsule (500 mg total) by mouth 2 (two) times daily. 10 capsule 0   chlorthalidone (HYGROTON) 25 MG tablet Take 1 tablet (25 mg total) by mouth in the morning. 90 tablet 3   Cholecalciferol (VITAMIN D) 2000 units tablet Take 2,000 Units by mouth 2 (two) times daily.     cyclobenzaprine (FLEXERIL) 10 MG  tablet Take 1 tablet (10 mg total) by mouth every 8 (eight) hours as needed for spasms 50 tablet 0   doxycycline (MONODOX) 100 MG capsule Take 1 capsule (100 mg total) by mouth 2 (two) times daily for 7 days 14 capsule 0   DULoxetine (CYMBALTA) 30 MG capsule Take 1 capsule (30 mg total) by mouth daily. 90 capsule 3   Erenumab-aooe (AIMOVIG) 140 MG/ML SOAJ Inject 140 mg into the skin every 30 (thirty) days. 3 mL 3   ezetimibe (ZETIA) 10 MG tablet Take 1 tablet (10 mg total) by mouth daily. 90 tablet 3   levothyroxine (SYNTHROID) 75 MCG tablet Take 1 tablet (75 mcg total) by mouth daily for 6 days and 1/2 (one-half) a tablet by mouth for one day a week. 90 tablet 2   losartan (COZAAR) 100 MG tablet Take 1 tablet (100 mg total) by mouth daily. 90 tablet 3   Melatonin 5 MG CAPS Take 5 mg by mouth at bedtime.     methocarbamol (ROBAXIN) 500 MG tablet Take 1 tablet (500 mg total) by mouth every 6 (six) hours. 40 tablet 0   methocarbamol (ROBAXIN) 500 MG tablet Take 1 tablet (500 mg total) by mouth every 8 (eight) hours as needed for muscle spasms 90 tablet 0   Multiple Vitamin (MULTIVITAMIN) capsule Take 1 capsule by mouth daily.     omeprazole (PRILOSEC) 40 MG capsule Take 1 capsule (40 mg total) by mouth 2 (two) times daily. 180 capsule 3   oxyCODONE-acetaminophen (PERCOCET) 10-325 MG tablet Take 1 tablet by mouth every 6 (six) hours as needed. 28 tablet 0   phenazopyridine (PYRIDIUM) 200 MG tablet Take 1 tablet (200 mg total) by mouth 3 (three) times daily. 6 tablet 0   pramipexole (MIRAPEX) 0.125 MG tablet Take 1 tablet (0.125 mg total) by mouth 2 to 3 hours prior to bedtime. 30 tablet 6   pramipexole (MIRAPEX) 0.125 MG tablet Take 1 tablet (0.125 mg total) by mouth 2 to 3 hours prior to bedtime as directed 30 tablet 3   pramipexole (MIRAPEX) 0.125 MG tablet Take 1 tablet (0.125 mg total) by mouth 2 to 3 hours prior to bedtime as directed 9 tablet 1   pramipexole (MIRAPEX) 0.125 MG tablet Take 1  tablet (0.125 mg total) by mouth 2-3 hours prior to bedtime as directed 90 tablet 1   predniSONE (DELTASONE) 10 MG tablet Take 2 tablets (20 mg total) by mouth daily for 5 days 10 tablet 0   predniSONE (STERAPRED UNI-PAK 21 TAB) 10 MG (21) TBPK tablet Take as directed for 6 days. 21 tablet 0   rosuvastatin (CRESTOR) 20 MG tablet Take 1 tablet by mouth once daily. 90 tablet 3   vitamin B-12 (CYANOCOBALAMIN) 100 MCG tablet Take 100 mcg by mouth daily.     chlorthalidone (HYGROTON) 25 MG tablet Take 1 tablet (25 mg total) by mouth  every morning. 90 tablet 0   DULoxetine (CYMBALTA) 30 MG capsule TAKE 1 CAPSULE BY MOUTH DAILY 90 capsule 2   omeprazole (PRILOSEC) 40 MG capsule TAKE 1 CAPSULE BY MOUTH TWICE DAILY. 180 capsule 2   No current facility-administered medications for this visit.    Family History  Problem Relation Age of Onset   Celiac disease Mother    Osteoporosis Mother    Heart disease Father    Stroke Father    Irritable bowel syndrome Sister    Lung cancer Maternal Grandmother    Breast cancer Paternal Grandmother    Heart disease Sister    Melanoma Sister    Colon cancer Neg Hx    Esophageal cancer Neg Hx    Stomach cancer Neg Hx    Rectal cancer Neg Hx     ROS: Constitutional: negative Genitourinary:negative  Exam:   BP 107/82 (BP Location: Left Arm, Patient Position: Sitting, Cuff Size: Large)   Pulse 97   Ht '5\' 5"'$  (1.651 m) Comment: Reported  Wt 200 lb 12.8 oz (91.1 kg)   LMP 11/25/2005   BMI 33.41 kg/m   Height: '5\' 5"'$  (165.1 cm) (Reported)  General appearance: alert, cooperative and appears stated age Head: Normocephalic, without obvious abnormality, atraumatic Neck: no adenopathy, supple, symmetrical, trachea midline and thyroid normal to inspection and palpation Lungs: clear to auscultation bilaterally Breasts: normal appearance, no masses or tenderness Heart: regular rate and rhythm Abdomen: soft, non-tender; bowel sounds normal; no masses,  no  organomegaly Extremities: extremities normal, atraumatic, no cyanosis or edema Skin: Skin color, texture, turgor normal. No rashes or lesions Lymph nodes: Cervical, supraclavicular, and axillary nodes normal. No abnormal inguinal nodes palpated Neurologic: Grossly normal   Pelvic: External genitalia:  no lesions              Urethra:  normal appearing urethra with no masses, tenderness or lesions              Bartholins and Skenes: normal                 Vagina: normal appearing vagina with normal color and no discharge, no lesions              Cervix: absent              Pap taken: No. Bimanual Exam:  Uterus:  uterus absent              Adnexa: no mass, fullness, tenderness               Rectovaginal: Confirms               Anus:  normal sphincter tone, no lesions  Chaperone, Octaviano Batty, CMA, was present for exam.  Assessment/Plan: 1. Well woman exam with routine gynecological exam - Pap smear not indicated - Mammogram up to date - Colonoscopy 2018.  Follow up 10 years. - Bone mineral density done with Dr. Joylene Draft - lab work done done with Dr. Joylene Draft - vaccines reviewed/updated  2. HSV-1 infection - acyclovir (ZOVIRAX) 400 MG tablet; Take 1 tablet (400 mg total) by mouth 2 (two) times daily.  Dispense: 180 tablet; Refill: 4  3. Cystocele, midline  4. H/O: hysterectomy  5. History of stroke  6.  Hot flashes - will try gabapentin '100mg'$  nightly and can increase this every 3 nights by '100mg'$ .  Rx to pharmacy.

## 2022-06-24 ENCOUNTER — Other Ambulatory Visit (HOSPITAL_COMMUNITY): Payer: Self-pay

## 2022-06-26 ENCOUNTER — Other Ambulatory Visit (HOSPITAL_COMMUNITY): Payer: Self-pay

## 2022-07-11 ENCOUNTER — Other Ambulatory Visit (HOSPITAL_COMMUNITY): Payer: Self-pay

## 2022-07-12 ENCOUNTER — Other Ambulatory Visit (HOSPITAL_COMMUNITY): Payer: Self-pay

## 2022-07-12 MED ORDER — ROSUVASTATIN CALCIUM 20 MG PO TABS
20.0000 mg | ORAL_TABLET | Freq: Every day | ORAL | 3 refills | Status: DC
  Filled 2022-07-12 (×2): qty 90, 90d supply, fill #0
  Filled 2022-10-14: qty 90, 90d supply, fill #1
  Filled 2023-01-29: qty 90, 90d supply, fill #2
  Filled 2023-04-28: qty 90, 90d supply, fill #0

## 2022-07-16 ENCOUNTER — Other Ambulatory Visit (HOSPITAL_COMMUNITY): Payer: Self-pay

## 2022-07-22 ENCOUNTER — Other Ambulatory Visit (HOSPITAL_COMMUNITY): Payer: Self-pay

## 2022-07-22 ENCOUNTER — Ambulatory Visit (INDEPENDENT_AMBULATORY_CARE_PROVIDER_SITE_OTHER): Payer: 59

## 2022-07-22 DIAGNOSIS — I495 Sick sinus syndrome: Secondary | ICD-10-CM | POA: Diagnosis not present

## 2022-07-23 LAB — CUP PACEART REMOTE DEVICE CHECK
Date Time Interrogation Session: 20230820000449
Implantable Pulse Generator Implant Date: 20210730

## 2022-07-24 ENCOUNTER — Other Ambulatory Visit (HOSPITAL_COMMUNITY): Payer: Self-pay

## 2022-07-26 NOTE — Progress Notes (Signed)
Carelink Summary Report / Loop Recorder 

## 2022-07-31 ENCOUNTER — Other Ambulatory Visit (HOSPITAL_COMMUNITY): Payer: Self-pay

## 2022-08-14 DIAGNOSIS — N390 Urinary tract infection, site not specified: Secondary | ICD-10-CM | POA: Diagnosis not present

## 2022-08-15 NOTE — Progress Notes (Signed)
Carelink Summary Report / Loop Recorder 

## 2022-08-16 ENCOUNTER — Ambulatory Visit (INDEPENDENT_AMBULATORY_CARE_PROVIDER_SITE_OTHER): Payer: 59

## 2022-08-16 DIAGNOSIS — I495 Sick sinus syndrome: Secondary | ICD-10-CM

## 2022-08-19 ENCOUNTER — Other Ambulatory Visit (HOSPITAL_COMMUNITY): Payer: Self-pay

## 2022-08-19 DIAGNOSIS — R7989 Other specified abnormal findings of blood chemistry: Secondary | ICD-10-CM | POA: Diagnosis not present

## 2022-08-19 DIAGNOSIS — E785 Hyperlipidemia, unspecified: Secondary | ICD-10-CM | POA: Diagnosis not present

## 2022-08-19 DIAGNOSIS — I10 Essential (primary) hypertension: Secondary | ICD-10-CM | POA: Diagnosis not present

## 2022-08-19 DIAGNOSIS — Z1212 Encounter for screening for malignant neoplasm of rectum: Secondary | ICD-10-CM | POA: Diagnosis not present

## 2022-08-19 DIAGNOSIS — R7301 Impaired fasting glucose: Secondary | ICD-10-CM | POA: Diagnosis not present

## 2022-08-19 LAB — CUP PACEART REMOTE DEVICE CHECK
Date Time Interrogation Session: 20230922001027
Implantable Pulse Generator Implant Date: 20210730

## 2022-08-19 MED ORDER — CHLORTHALIDONE 25 MG PO TABS
25.0000 mg | ORAL_TABLET | Freq: Every morning | ORAL | 3 refills | Status: DC
  Filled 2022-08-19: qty 90, 90d supply, fill #0
  Filled 2022-11-25: qty 90, 90d supply, fill #1
  Filled 2023-02-21: qty 90, 90d supply, fill #0
  Filled 2023-06-02: qty 90, 90d supply, fill #1

## 2022-08-20 ENCOUNTER — Other Ambulatory Visit (HOSPITAL_COMMUNITY): Payer: Self-pay

## 2022-08-20 ENCOUNTER — Other Ambulatory Visit (HOSPITAL_BASED_OUTPATIENT_CLINIC_OR_DEPARTMENT_OTHER): Payer: Self-pay | Admitting: *Deleted

## 2022-08-20 ENCOUNTER — Encounter (HOSPITAL_BASED_OUTPATIENT_CLINIC_OR_DEPARTMENT_OTHER): Payer: Self-pay | Admitting: Obstetrics & Gynecology

## 2022-08-20 DIAGNOSIS — R232 Flushing: Secondary | ICD-10-CM

## 2022-08-20 MED ORDER — GABAPENTIN 100 MG PO CAPS
200.0000 mg | ORAL_CAPSULE | Freq: Every day | ORAL | 0 refills | Status: DC
  Filled 2022-08-20: qty 180, 90d supply, fill #0

## 2022-08-22 DIAGNOSIS — I495 Sick sinus syndrome: Secondary | ICD-10-CM | POA: Diagnosis not present

## 2022-08-23 NOTE — Progress Notes (Signed)
Carelink Summary Report / Loop Recorder 

## 2022-08-26 ENCOUNTER — Other Ambulatory Visit (HOSPITAL_COMMUNITY): Payer: Self-pay

## 2022-08-26 DIAGNOSIS — R7301 Impaired fasting glucose: Secondary | ICD-10-CM | POA: Diagnosis not present

## 2022-08-26 DIAGNOSIS — I2510 Atherosclerotic heart disease of native coronary artery without angina pectoris: Secondary | ICD-10-CM | POA: Diagnosis not present

## 2022-08-26 DIAGNOSIS — R82998 Other abnormal findings in urine: Secondary | ICD-10-CM | POA: Diagnosis not present

## 2022-08-26 DIAGNOSIS — I639 Cerebral infarction, unspecified: Secondary | ICD-10-CM | POA: Diagnosis not present

## 2022-08-26 DIAGNOSIS — E669 Obesity, unspecified: Secondary | ICD-10-CM | POA: Diagnosis not present

## 2022-08-26 DIAGNOSIS — E785 Hyperlipidemia, unspecified: Secondary | ICD-10-CM | POA: Diagnosis not present

## 2022-08-26 DIAGNOSIS — I10 Essential (primary) hypertension: Secondary | ICD-10-CM | POA: Diagnosis not present

## 2022-08-26 DIAGNOSIS — Z0000 Encounter for general adult medical examination without abnormal findings: Secondary | ICD-10-CM | POA: Diagnosis not present

## 2022-08-26 DIAGNOSIS — M4800 Spinal stenosis, site unspecified: Secondary | ICD-10-CM | POA: Diagnosis not present

## 2022-08-26 MED ORDER — EZETIMIBE 10 MG PO TABS
10.0000 mg | ORAL_TABLET | Freq: Every day | ORAL | 3 refills | Status: DC
  Filled 2022-08-26 – 2023-02-21 (×2): qty 90, 90d supply, fill #0
  Filled 2023-06-02: qty 90, 90d supply, fill #1
  Filled 2023-08-24: qty 90, 90d supply, fill #2

## 2022-08-26 MED ORDER — EZETIMIBE 10 MG PO TABS
10.0000 mg | ORAL_TABLET | Freq: Every day | ORAL | 3 refills | Status: DC
  Filled 2022-08-26: qty 90, 90d supply, fill #0
  Filled 2022-11-25: qty 90, 90d supply, fill #1

## 2022-09-18 ENCOUNTER — Ambulatory Visit (INDEPENDENT_AMBULATORY_CARE_PROVIDER_SITE_OTHER): Payer: 59

## 2022-09-18 DIAGNOSIS — I495 Sick sinus syndrome: Secondary | ICD-10-CM

## 2022-09-18 LAB — CUP PACEART REMOTE DEVICE CHECK
Date Time Interrogation Session: 20231025000932
Implantable Pulse Generator Implant Date: 20210730

## 2022-09-21 ENCOUNTER — Other Ambulatory Visit (HOSPITAL_COMMUNITY): Payer: Self-pay

## 2022-09-23 ENCOUNTER — Other Ambulatory Visit (HOSPITAL_COMMUNITY): Payer: Self-pay

## 2022-09-23 MED ORDER — PRAMIPEXOLE DIHYDROCHLORIDE 0.125 MG PO TABS
0.1250 mg | ORAL_TABLET | Freq: Every day | ORAL | 1 refills | Status: DC
  Filled 2022-09-23: qty 90, 90d supply, fill #0
  Filled 2022-12-26: qty 90, 90d supply, fill #1

## 2022-10-01 NOTE — Progress Notes (Signed)
Carelink Summary Report / Loop Recorder 

## 2022-10-07 NOTE — Addendum Note (Signed)
Addended by: Douglass Rivers D on: 10/07/2022 08:51 AM   Modules accepted: Level of Service

## 2022-10-14 ENCOUNTER — Other Ambulatory Visit (HOSPITAL_COMMUNITY): Payer: Self-pay

## 2022-10-15 ENCOUNTER — Other Ambulatory Visit (HOSPITAL_COMMUNITY): Payer: Self-pay

## 2022-10-15 MED ORDER — DULOXETINE HCL 30 MG PO CPEP
ORAL_CAPSULE | ORAL | 3 refills | Status: DC
  Filled 2022-10-15 – 2023-02-21 (×2): qty 90, 90d supply, fill #0
  Filled 2023-05-18: qty 90, 90d supply, fill #1
  Filled 2023-07-14 – 2023-08-18 (×2): qty 90, 90d supply, fill #2

## 2022-10-15 MED ORDER — OMEPRAZOLE 40 MG PO CPDR
40.0000 mg | DELAYED_RELEASE_CAPSULE | Freq: Two times a day (BID) | ORAL | 3 refills | Status: DC
  Filled 2022-10-15: qty 180, 90d supply, fill #0
  Filled 2023-01-05: qty 180, 90d supply, fill #1
  Filled 2023-04-07: qty 180, 90d supply, fill #0
  Filled 2023-06-27: qty 180, 90d supply, fill #1

## 2022-10-21 ENCOUNTER — Ambulatory Visit (INDEPENDENT_AMBULATORY_CARE_PROVIDER_SITE_OTHER): Payer: 59

## 2022-10-21 DIAGNOSIS — I495 Sick sinus syndrome: Secondary | ICD-10-CM

## 2022-10-22 LAB — CUP PACEART REMOTE DEVICE CHECK
Date Time Interrogation Session: 20231127001108
Implantable Pulse Generator Implant Date: 20210730

## 2022-10-28 ENCOUNTER — Other Ambulatory Visit (HOSPITAL_COMMUNITY): Payer: Self-pay

## 2022-10-29 ENCOUNTER — Other Ambulatory Visit (HOSPITAL_COMMUNITY): Payer: Self-pay

## 2022-10-31 DIAGNOSIS — Z1231 Encounter for screening mammogram for malignant neoplasm of breast: Secondary | ICD-10-CM | POA: Diagnosis not present

## 2022-11-10 ENCOUNTER — Other Ambulatory Visit (HOSPITAL_COMMUNITY): Payer: Self-pay

## 2022-11-10 ENCOUNTER — Other Ambulatory Visit (HOSPITAL_BASED_OUTPATIENT_CLINIC_OR_DEPARTMENT_OTHER): Payer: Self-pay | Admitting: Obstetrics & Gynecology

## 2022-11-10 DIAGNOSIS — R232 Flushing: Secondary | ICD-10-CM

## 2022-11-11 ENCOUNTER — Other Ambulatory Visit: Payer: Self-pay

## 2022-11-11 ENCOUNTER — Other Ambulatory Visit (HOSPITAL_COMMUNITY): Payer: Self-pay

## 2022-11-11 MED ORDER — GABAPENTIN 100 MG PO CAPS
200.0000 mg | ORAL_CAPSULE | Freq: Every day | ORAL | 0 refills | Status: DC
  Filled 2022-11-11 – 2022-11-15 (×2): qty 180, 90d supply, fill #0

## 2022-11-11 MED ORDER — LOSARTAN POTASSIUM 100 MG PO TABS
100.0000 mg | ORAL_TABLET | Freq: Every day | ORAL | 3 refills | Status: DC
  Filled 2022-11-11 – 2023-02-10 (×2): qty 90, 90d supply, fill #0
  Filled 2023-05-12: qty 90, 90d supply, fill #1
  Filled 2023-08-04 (×2): qty 90, 90d supply, fill #2

## 2022-11-12 ENCOUNTER — Encounter (HOSPITAL_BASED_OUTPATIENT_CLINIC_OR_DEPARTMENT_OTHER): Payer: Self-pay | Admitting: Obstetrics & Gynecology

## 2022-11-13 ENCOUNTER — Encounter (HOSPITAL_BASED_OUTPATIENT_CLINIC_OR_DEPARTMENT_OTHER): Payer: Self-pay | Admitting: *Deleted

## 2022-11-13 ENCOUNTER — Other Ambulatory Visit (HOSPITAL_BASED_OUTPATIENT_CLINIC_OR_DEPARTMENT_OTHER): Payer: Self-pay | Admitting: *Deleted

## 2022-11-13 ENCOUNTER — Other Ambulatory Visit (HOSPITAL_COMMUNITY): Payer: Self-pay

## 2022-11-15 ENCOUNTER — Other Ambulatory Visit (HOSPITAL_COMMUNITY): Payer: Self-pay

## 2022-11-26 ENCOUNTER — Ambulatory Visit (INDEPENDENT_AMBULATORY_CARE_PROVIDER_SITE_OTHER): Payer: Commercial Managed Care - PPO

## 2022-11-26 DIAGNOSIS — I63411 Cerebral infarction due to embolism of right middle cerebral artery: Secondary | ICD-10-CM

## 2022-11-26 LAB — CUP PACEART REMOTE DEVICE CHECK
Date Time Interrogation Session: 20240102000959
Implantable Pulse Generator Implant Date: 20210730

## 2022-11-27 ENCOUNTER — Other Ambulatory Visit (HOSPITAL_COMMUNITY): Payer: Self-pay

## 2022-11-28 ENCOUNTER — Other Ambulatory Visit (HOSPITAL_COMMUNITY): Payer: Self-pay

## 2022-11-28 ENCOUNTER — Other Ambulatory Visit: Payer: Self-pay

## 2022-11-28 MED ORDER — SULFAMETHOXAZOLE-TRIMETHOPRIM 800-160 MG PO TABS
1.0000 | ORAL_TABLET | Freq: Two times a day (BID) | ORAL | 0 refills | Status: DC
  Filled 2022-11-28 (×2): qty 20, 10d supply, fill #0

## 2022-12-03 NOTE — Progress Notes (Signed)
Carelink Summary Report / Loop Recorder 

## 2022-12-26 ENCOUNTER — Other Ambulatory Visit (HOSPITAL_COMMUNITY): Payer: Self-pay

## 2022-12-30 ENCOUNTER — Ambulatory Visit: Payer: Commercial Managed Care - PPO

## 2022-12-30 DIAGNOSIS — I63411 Cerebral infarction due to embolism of right middle cerebral artery: Secondary | ICD-10-CM

## 2022-12-31 LAB — CUP PACEART REMOTE DEVICE CHECK
Date Time Interrogation Session: 20240204001027
Implantable Pulse Generator Implant Date: 20210730

## 2022-12-31 NOTE — Progress Notes (Signed)
Carelink Summary Report / Loop Recorder 

## 2023-01-01 ENCOUNTER — Other Ambulatory Visit (HOSPITAL_COMMUNITY): Payer: Self-pay

## 2023-01-05 ENCOUNTER — Other Ambulatory Visit: Payer: Self-pay

## 2023-01-06 ENCOUNTER — Other Ambulatory Visit: Payer: Self-pay

## 2023-01-06 ENCOUNTER — Other Ambulatory Visit (HOSPITAL_COMMUNITY): Payer: Self-pay

## 2023-01-29 ENCOUNTER — Other Ambulatory Visit (HOSPITAL_COMMUNITY): Payer: Self-pay

## 2023-01-30 ENCOUNTER — Other Ambulatory Visit (HOSPITAL_COMMUNITY): Payer: Self-pay

## 2023-01-31 ENCOUNTER — Other Ambulatory Visit (HOSPITAL_COMMUNITY): Payer: Self-pay

## 2023-02-03 ENCOUNTER — Ambulatory Visit (INDEPENDENT_AMBULATORY_CARE_PROVIDER_SITE_OTHER): Payer: Commercial Managed Care - PPO

## 2023-02-03 DIAGNOSIS — I63411 Cerebral infarction due to embolism of right middle cerebral artery: Secondary | ICD-10-CM | POA: Diagnosis not present

## 2023-02-05 ENCOUNTER — Other Ambulatory Visit (HOSPITAL_COMMUNITY): Payer: Self-pay

## 2023-02-05 LAB — CUP PACEART REMOTE DEVICE CHECK
Date Time Interrogation Session: 20240311001431
Implantable Pulse Generator Implant Date: 20210730

## 2023-02-06 ENCOUNTER — Other Ambulatory Visit: Payer: Self-pay

## 2023-02-06 ENCOUNTER — Other Ambulatory Visit (HOSPITAL_COMMUNITY): Payer: Self-pay

## 2023-02-10 ENCOUNTER — Other Ambulatory Visit (HOSPITAL_COMMUNITY): Payer: Self-pay

## 2023-02-10 ENCOUNTER — Other Ambulatory Visit (HOSPITAL_BASED_OUTPATIENT_CLINIC_OR_DEPARTMENT_OTHER): Payer: Self-pay | Admitting: Obstetrics and Gynecology

## 2023-02-10 DIAGNOSIS — R232 Flushing: Secondary | ICD-10-CM

## 2023-02-10 MED ORDER — GABAPENTIN 100 MG PO CAPS
200.0000 mg | ORAL_CAPSULE | Freq: Every day | ORAL | 1 refills | Status: DC
  Filled 2023-02-10: qty 180, 90d supply, fill #0
  Filled 2023-05-18: qty 180, 90d supply, fill #1

## 2023-02-11 ENCOUNTER — Other Ambulatory Visit (HOSPITAL_COMMUNITY): Payer: Self-pay

## 2023-02-13 NOTE — Progress Notes (Signed)
Carelink Summary Report / Loop Recorder 

## 2023-02-14 ENCOUNTER — Other Ambulatory Visit (HOSPITAL_COMMUNITY): Payer: Self-pay

## 2023-02-21 ENCOUNTER — Other Ambulatory Visit (HOSPITAL_COMMUNITY): Payer: Self-pay

## 2023-03-03 ENCOUNTER — Other Ambulatory Visit (HOSPITAL_COMMUNITY): Payer: Self-pay

## 2023-03-03 MED ORDER — LEVOTHYROXINE SODIUM 75 MCG PO TABS
ORAL_TABLET | ORAL | 2 refills | Status: DC
  Filled 2023-03-03: qty 83, 89d supply, fill #0
  Filled 2023-06-03: qty 83, 89d supply, fill #1
  Filled 2023-09-08: qty 83, 89d supply, fill #2
  Filled 2023-12-08: qty 83, 89d supply, fill #3

## 2023-03-07 ENCOUNTER — Other Ambulatory Visit (HOSPITAL_COMMUNITY): Payer: Self-pay

## 2023-03-10 ENCOUNTER — Ambulatory Visit (INDEPENDENT_AMBULATORY_CARE_PROVIDER_SITE_OTHER): Payer: Commercial Managed Care - PPO

## 2023-03-10 DIAGNOSIS — I63411 Cerebral infarction due to embolism of right middle cerebral artery: Secondary | ICD-10-CM | POA: Diagnosis not present

## 2023-03-11 LAB — CUP PACEART REMOTE DEVICE CHECK
Date Time Interrogation Session: 20240413000449
Implantable Pulse Generator Implant Date: 20210730

## 2023-03-14 ENCOUNTER — Other Ambulatory Visit: Payer: Self-pay

## 2023-03-14 ENCOUNTER — Other Ambulatory Visit (HOSPITAL_COMMUNITY): Payer: Self-pay

## 2023-03-17 NOTE — Progress Notes (Signed)
Carelink Summary Report / Loop Recorder 

## 2023-03-21 DIAGNOSIS — I10 Essential (primary) hypertension: Secondary | ICD-10-CM | POA: Diagnosis not present

## 2023-03-21 DIAGNOSIS — E785 Hyperlipidemia, unspecified: Secondary | ICD-10-CM | POA: Diagnosis not present

## 2023-03-21 DIAGNOSIS — R7301 Impaired fasting glucose: Secondary | ICD-10-CM | POA: Diagnosis not present

## 2023-03-25 ENCOUNTER — Other Ambulatory Visit (HOSPITAL_COMMUNITY): Payer: Self-pay

## 2023-04-07 ENCOUNTER — Other Ambulatory Visit (HOSPITAL_COMMUNITY): Payer: Self-pay

## 2023-04-14 ENCOUNTER — Ambulatory Visit (INDEPENDENT_AMBULATORY_CARE_PROVIDER_SITE_OTHER): Payer: Commercial Managed Care - PPO

## 2023-04-14 ENCOUNTER — Other Ambulatory Visit (HOSPITAL_COMMUNITY): Payer: Self-pay

## 2023-04-14 DIAGNOSIS — I63411 Cerebral infarction due to embolism of right middle cerebral artery: Secondary | ICD-10-CM | POA: Diagnosis not present

## 2023-04-14 LAB — CUP PACEART REMOTE DEVICE CHECK
Date Time Interrogation Session: 20240520001051
Implantable Pulse Generator Implant Date: 20210730

## 2023-04-14 MED ORDER — AIMOVIG 140 MG/ML ~~LOC~~ SOAJ
140.0000 mg | SUBCUTANEOUS | 3 refills | Status: DC
  Filled 2023-04-14: qty 3, 90d supply, fill #0
  Filled 2023-07-25 – 2023-08-04 (×4): qty 3, 90d supply, fill #1
  Filled 2023-11-02: qty 3, 90d supply, fill #2
  Filled 2024-01-17: qty 3, 90d supply, fill #3

## 2023-04-15 ENCOUNTER — Other Ambulatory Visit (HOSPITAL_COMMUNITY): Payer: Self-pay

## 2023-04-16 NOTE — Progress Notes (Signed)
Carelink Summary Report / Loop Recorder 

## 2023-04-23 ENCOUNTER — Other Ambulatory Visit (HOSPITAL_COMMUNITY): Payer: Self-pay

## 2023-04-28 ENCOUNTER — Other Ambulatory Visit (HOSPITAL_COMMUNITY): Payer: Self-pay

## 2023-05-12 ENCOUNTER — Other Ambulatory Visit (HOSPITAL_COMMUNITY): Payer: Self-pay

## 2023-05-13 NOTE — Progress Notes (Signed)
Carelink Summary Report / Loop Recorder 

## 2023-05-19 ENCOUNTER — Ambulatory Visit: Payer: 59

## 2023-05-19 ENCOUNTER — Other Ambulatory Visit (HOSPITAL_COMMUNITY): Payer: Self-pay

## 2023-05-19 DIAGNOSIS — I63411 Cerebral infarction due to embolism of right middle cerebral artery: Secondary | ICD-10-CM | POA: Diagnosis not present

## 2023-05-19 LAB — CUP PACEART REMOTE DEVICE CHECK
Date Time Interrogation Session: 20240624000736
Implantable Pulse Generator Implant Date: 20210730

## 2023-05-21 ENCOUNTER — Other Ambulatory Visit (HOSPITAL_COMMUNITY): Payer: Self-pay

## 2023-06-02 ENCOUNTER — Other Ambulatory Visit (HOSPITAL_COMMUNITY): Payer: Self-pay

## 2023-06-02 MED ORDER — PRAMIPEXOLE DIHYDROCHLORIDE 0.125 MG PO TABS
0.1250 mg | ORAL_TABLET | Freq: Every day | ORAL | 1 refills | Status: DC
  Filled 2023-06-02 – 2023-06-13 (×2): qty 90, 90d supply, fill #0
  Filled 2023-09-08: qty 90, 90d supply, fill #1

## 2023-06-04 ENCOUNTER — Other Ambulatory Visit: Payer: Self-pay

## 2023-06-05 ENCOUNTER — Ambulatory Visit (HOSPITAL_BASED_OUTPATIENT_CLINIC_OR_DEPARTMENT_OTHER): Payer: 59 | Admitting: Obstetrics & Gynecology

## 2023-06-06 ENCOUNTER — Other Ambulatory Visit (HOSPITAL_COMMUNITY): Payer: Self-pay

## 2023-06-07 ENCOUNTER — Other Ambulatory Visit (HOSPITAL_COMMUNITY): Payer: Self-pay

## 2023-06-09 NOTE — Progress Notes (Signed)
 [facility] Summary Report / Loop Recorder 

## 2023-06-13 ENCOUNTER — Other Ambulatory Visit (HOSPITAL_COMMUNITY): Payer: Self-pay

## 2023-06-23 ENCOUNTER — Ambulatory Visit (INDEPENDENT_AMBULATORY_CARE_PROVIDER_SITE_OTHER): Payer: Commercial Managed Care - PPO

## 2023-06-23 DIAGNOSIS — I63411 Cerebral infarction due to embolism of right middle cerebral artery: Secondary | ICD-10-CM | POA: Diagnosis not present

## 2023-06-23 LAB — CUP PACEART REMOTE DEVICE CHECK
Date Time Interrogation Session: 20240727000513
Implantable Pulse Generator Implant Date: 20210730

## 2023-07-10 NOTE — Progress Notes (Signed)
Carelink Summary Report / Loop Recorder 

## 2023-07-14 ENCOUNTER — Other Ambulatory Visit (HOSPITAL_COMMUNITY): Payer: Self-pay

## 2023-07-16 ENCOUNTER — Other Ambulatory Visit (HOSPITAL_COMMUNITY): Payer: Self-pay

## 2023-07-16 DIAGNOSIS — R051 Acute cough: Secondary | ICD-10-CM | POA: Diagnosis not present

## 2023-07-16 DIAGNOSIS — R350 Frequency of micturition: Secondary | ICD-10-CM | POA: Diagnosis not present

## 2023-07-16 DIAGNOSIS — I10 Essential (primary) hypertension: Secondary | ICD-10-CM | POA: Diagnosis not present

## 2023-07-16 DIAGNOSIS — N390 Urinary tract infection, site not specified: Secondary | ICD-10-CM | POA: Diagnosis not present

## 2023-07-16 DIAGNOSIS — N3001 Acute cystitis with hematuria: Secondary | ICD-10-CM | POA: Diagnosis not present

## 2023-07-16 DIAGNOSIS — J069 Acute upper respiratory infection, unspecified: Secondary | ICD-10-CM | POA: Diagnosis not present

## 2023-07-16 MED ORDER — AMOXICILLIN-POT CLAVULANATE 875-125 MG PO TABS
1.0000 | ORAL_TABLET | Freq: Two times a day (BID) | ORAL | 0 refills | Status: DC
  Filled 2023-07-16: qty 20, 10d supply, fill #0

## 2023-07-24 LAB — CUP PACEART REMOTE DEVICE CHECK
Date Time Interrogation Session: 20240829000455
Implantable Pulse Generator Implant Date: 20210730

## 2023-07-25 ENCOUNTER — Other Ambulatory Visit (HOSPITAL_COMMUNITY): Payer: Self-pay

## 2023-07-29 ENCOUNTER — Ambulatory Visit (INDEPENDENT_AMBULATORY_CARE_PROVIDER_SITE_OTHER): Payer: Commercial Managed Care - PPO

## 2023-07-29 DIAGNOSIS — I63411 Cerebral infarction due to embolism of right middle cerebral artery: Secondary | ICD-10-CM | POA: Diagnosis not present

## 2023-08-04 ENCOUNTER — Other Ambulatory Visit (HOSPITAL_COMMUNITY): Payer: Self-pay

## 2023-08-04 ENCOUNTER — Other Ambulatory Visit: Payer: Self-pay

## 2023-08-04 MED ORDER — ROSUVASTATIN CALCIUM 20 MG PO TABS
20.0000 mg | ORAL_TABLET | Freq: Every day | ORAL | 3 refills | Status: DC
  Filled 2023-08-04: qty 90, 90d supply, fill #0
  Filled 2023-11-02: qty 90, 90d supply, fill #1
  Filled 2024-02-10: qty 90, 90d supply, fill #2
  Filled 2024-05-15: qty 90, 90d supply, fill #3

## 2023-08-11 NOTE — Progress Notes (Signed)
Carelink Summary Report / Loop Recorder 

## 2023-08-24 ENCOUNTER — Other Ambulatory Visit (HOSPITAL_BASED_OUTPATIENT_CLINIC_OR_DEPARTMENT_OTHER): Payer: Self-pay | Admitting: Obstetrics & Gynecology

## 2023-08-24 DIAGNOSIS — R232 Flushing: Secondary | ICD-10-CM

## 2023-08-25 ENCOUNTER — Other Ambulatory Visit (HOSPITAL_COMMUNITY): Payer: Self-pay

## 2023-08-25 MED ORDER — GABAPENTIN 100 MG PO CAPS
200.0000 mg | ORAL_CAPSULE | Freq: Every day | ORAL | 1 refills | Status: DC
Start: 2023-08-25 — End: 2023-09-26
  Filled 2023-08-25: qty 180, 90d supply, fill #0

## 2023-08-27 LAB — CUP PACEART REMOTE DEVICE CHECK
Date Time Interrogation Session: 20241001000711
Implantable Pulse Generator Implant Date: 20210730

## 2023-09-01 ENCOUNTER — Ambulatory Visit (INDEPENDENT_AMBULATORY_CARE_PROVIDER_SITE_OTHER): Payer: 59

## 2023-09-01 DIAGNOSIS — I63411 Cerebral infarction due to embolism of right middle cerebral artery: Secondary | ICD-10-CM | POA: Diagnosis not present

## 2023-09-08 ENCOUNTER — Other Ambulatory Visit: Payer: Self-pay

## 2023-09-09 ENCOUNTER — Other Ambulatory Visit (HOSPITAL_COMMUNITY): Payer: Self-pay

## 2023-09-10 ENCOUNTER — Other Ambulatory Visit (HOSPITAL_COMMUNITY): Payer: Self-pay

## 2023-09-10 MED ORDER — CHLORTHALIDONE 25 MG PO TABS
25.0000 mg | ORAL_TABLET | Freq: Every morning | ORAL | 3 refills | Status: DC
  Filled 2023-09-10: qty 90, 90d supply, fill #0
  Filled 2023-12-08: qty 90, 90d supply, fill #1
  Filled 2024-02-20 – 2024-02-21 (×2): qty 90, 90d supply, fill #2
  Filled 2024-05-30: qty 90, 90d supply, fill #3

## 2023-09-11 ENCOUNTER — Other Ambulatory Visit (HOSPITAL_COMMUNITY): Payer: Self-pay

## 2023-09-11 MED ORDER — CHLORTHALIDONE 25 MG PO TABS
25.0000 mg | ORAL_TABLET | Freq: Every morning | ORAL | 3 refills | Status: DC
  Filled 2023-09-11: qty 90, 90d supply, fill #0

## 2023-09-16 ENCOUNTER — Other Ambulatory Visit (HOSPITAL_COMMUNITY): Payer: Self-pay

## 2023-09-17 NOTE — Progress Notes (Signed)
Carelink Summary Report / Loop Recorder 

## 2023-09-19 DIAGNOSIS — Z1389 Encounter for screening for other disorder: Secondary | ICD-10-CM | POA: Diagnosis not present

## 2023-09-19 DIAGNOSIS — Z1212 Encounter for screening for malignant neoplasm of rectum: Secondary | ICD-10-CM | POA: Diagnosis not present

## 2023-09-19 DIAGNOSIS — I10 Essential (primary) hypertension: Secondary | ICD-10-CM | POA: Diagnosis not present

## 2023-09-19 DIAGNOSIS — R82998 Other abnormal findings in urine: Secondary | ICD-10-CM | POA: Diagnosis not present

## 2023-09-19 DIAGNOSIS — E785 Hyperlipidemia, unspecified: Secondary | ICD-10-CM | POA: Diagnosis not present

## 2023-09-20 ENCOUNTER — Other Ambulatory Visit (HOSPITAL_COMMUNITY): Payer: Self-pay

## 2023-09-22 ENCOUNTER — Other Ambulatory Visit (HOSPITAL_COMMUNITY): Payer: Self-pay

## 2023-09-22 MED ORDER — OMEPRAZOLE 40 MG PO CPDR
40.0000 mg | DELAYED_RELEASE_CAPSULE | Freq: Two times a day (BID) | ORAL | 3 refills | Status: DC
  Filled 2023-09-22: qty 180, 90d supply, fill #0
  Filled 2023-12-12: qty 180, 90d supply, fill #1
  Filled 2024-03-06: qty 180, 90d supply, fill #2
  Filled 2024-06-12: qty 180, 90d supply, fill #3

## 2023-09-25 ENCOUNTER — Other Ambulatory Visit (HOSPITAL_COMMUNITY): Payer: Self-pay

## 2023-09-26 ENCOUNTER — Ambulatory Visit (HOSPITAL_BASED_OUTPATIENT_CLINIC_OR_DEPARTMENT_OTHER): Payer: Commercial Managed Care - PPO | Admitting: Obstetrics & Gynecology

## 2023-09-26 ENCOUNTER — Encounter (HOSPITAL_BASED_OUTPATIENT_CLINIC_OR_DEPARTMENT_OTHER): Payer: Self-pay | Admitting: Obstetrics & Gynecology

## 2023-09-26 ENCOUNTER — Other Ambulatory Visit (HOSPITAL_COMMUNITY): Payer: Self-pay

## 2023-09-26 ENCOUNTER — Other Ambulatory Visit: Payer: Self-pay

## 2023-09-26 VITALS — BP 115/82 | HR 84 | Ht 64.0 in | Wt 200.2 lb

## 2023-09-26 DIAGNOSIS — Z90710 Acquired absence of both cervix and uterus: Secondary | ICD-10-CM | POA: Diagnosis not present

## 2023-09-26 DIAGNOSIS — E785 Hyperlipidemia, unspecified: Secondary | ICD-10-CM | POA: Diagnosis not present

## 2023-09-26 DIAGNOSIS — I10 Essential (primary) hypertension: Secondary | ICD-10-CM | POA: Diagnosis not present

## 2023-09-26 DIAGNOSIS — E669 Obesity, unspecified: Secondary | ICD-10-CM | POA: Diagnosis not present

## 2023-09-26 DIAGNOSIS — B009 Herpesviral infection, unspecified: Secondary | ICD-10-CM | POA: Diagnosis not present

## 2023-09-26 DIAGNOSIS — Z0000 Encounter for general adult medical examination without abnormal findings: Secondary | ICD-10-CM | POA: Diagnosis not present

## 2023-09-26 DIAGNOSIS — I2510 Atherosclerotic heart disease of native coronary artery without angina pectoris: Secondary | ICD-10-CM | POA: Diagnosis not present

## 2023-09-26 DIAGNOSIS — G2581 Restless legs syndrome: Secondary | ICD-10-CM | POA: Diagnosis not present

## 2023-09-26 DIAGNOSIS — M797 Fibromyalgia: Secondary | ICD-10-CM | POA: Diagnosis not present

## 2023-09-26 DIAGNOSIS — K224 Dyskinesia of esophagus: Secondary | ICD-10-CM | POA: Diagnosis not present

## 2023-09-26 DIAGNOSIS — E118 Type 2 diabetes mellitus with unspecified complications: Secondary | ICD-10-CM | POA: Diagnosis not present

## 2023-09-26 DIAGNOSIS — I63411 Cerebral infarction due to embolism of right middle cerebral artery: Secondary | ICD-10-CM

## 2023-09-26 DIAGNOSIS — Z01419 Encounter for gynecological examination (general) (routine) without abnormal findings: Secondary | ICD-10-CM

## 2023-09-26 DIAGNOSIS — K219 Gastro-esophageal reflux disease without esophagitis: Secondary | ICD-10-CM | POA: Diagnosis not present

## 2023-09-26 DIAGNOSIS — R232 Flushing: Secondary | ICD-10-CM | POA: Diagnosis not present

## 2023-09-26 MED ORDER — ACYCLOVIR 400 MG PO TABS
400.0000 mg | ORAL_TABLET | Freq: Two times a day (BID) | ORAL | 4 refills | Status: DC
  Filled 2023-09-26: qty 180, 90d supply, fill #0
  Filled 2024-06-25: qty 180, 90d supply, fill #1

## 2023-09-26 MED ORDER — GABAPENTIN 100 MG PO CAPS
200.0000 mg | ORAL_CAPSULE | Freq: Every day | ORAL | 3 refills | Status: DC
  Filled 2023-09-26 – 2023-11-24 (×2): qty 180, 90d supply, fill #0
  Filled 2024-02-18: qty 180, 90d supply, fill #1
  Filled 2024-05-15: qty 180, 90d supply, fill #2
  Filled 2024-07-16 – 2024-08-01 (×2): qty 180, 90d supply, fill #3

## 2023-09-26 NOTE — Progress Notes (Signed)
62 y.o. G50P3003 Married White or Caucasian female here for annual exam.    Patient's last menstrual period was 11/25/2005.          Sexually active: Yes.    The current method of family planning is status post hysterectomy.    Smoker:  no  Health Maintenance: Pap:  04/10/2017 Negative History of abnormal Pap:  remote hx MMG:  10/31/2022 Negative Colonoscopy:  10/10/2017, follow up 10 years BMD:   done with Dr. Waynard Edwards Screening Labs: just done this week.  Has appt this afternoon.     reports that she quit smoking about 20 years ago. Her smoking use included cigarettes. She has never used smokeless tobacco. She reports that she does not currently use alcohol. She reports that she does not use drugs.  Past Medical History:  Diagnosis Date   Duodenitis without hemorrhage    Esophageal stricture    Esophagitis    Fibromyalgia    GERD (gastroesophageal reflux disease)    Hiatal hernia    History of anemia 2007   due to fibroids   History of loop recorder 06/23/2020   had CVA 06-21-20   Hyperlipidemia    Hypertension    Hypothyroidism    Leiomyoma of uterus, unspecified    Pneumonia    aspiration pneumonia after balloon surgery for weight loss   Stroke ([facility]) 06/21/2020   Right MCA infarct-recieved TPA    Past Surgical History:  Procedure Laterality Date   BICEPT TENODESIS Right 12/07/2019   Procedure: BICEPS TENODESIS;  Surgeon: Cammy Copa, MD;  Location: [facility];  Service: Orthopedics;  Laterality: Right;   BUBBLE STUDY  06/23/2020   Procedure: BUBBLE STUDY;  Surgeon: Little Ishikawa, MD;  Location: [facility] ENDOSCOPY;  Service: Cardiovascular;;   CARPAL TUNNEL RELEASE  2014   right wrist   CHOLECYSTECTOMY     HYSTEROSCOPY W/ ENDOMETRIAL ABLATION  02/2004   LOOP RECORDER INSERTION N/A 06/23/2020   Procedure: LOOP RECORDER INSERTION;  Surgeon: Regan Lemming, MD;  Location: [facility];  Service: Cardiovascular;  Laterality: N/A;    LUMBAR [place] SURGERY  04/15/2022   [place] [facility]   RECTOCELE REPAIR  2007   SHOULDER ARTHROSCOPY WITH OPEN ROTATOR CUFF REPAIR Right 12/07/2019   Procedure: RIGHT SHOULDER ARTHROSCOPY,   MINI OPEN SUBSCAPULARIS, AND SUPRASPINATUS REPAIR, OPEN BICEPS TENODESIS;  Surgeon: Cammy Copa, MD;  Location: [facility];  Service: Orthopedics;  Laterality: Right;   SHOULDER ARTHROSCOPY WITH ROTATOR CUFF REPAIR AND OPEN BICEPS TENODESIS Left 01/22/2021   Procedure: left shoulder arthroscopy debridement, mini open rotator cuff tear repair, biceps tenodesis;  Surgeon: Cammy Copa, MD;  Location: [facility];  Service: Orthopedics;  Laterality: Left;  NEEDS RNFA PLEASE   TEE WITHOUT CARDIOVERSION N/A 06/23/2020   Procedure: TRANSESOPHAGEAL ECHOCARDIOGRAM (TEE);  Surgeon: Little Ishikawa, MD;  Location: [facility];  Service: Cardiovascular;  Laterality: N/A;   TONSILLECTOMY     as child   TOTAL VAGINAL HYSTERECTOMY  03/2006   TUBAL LIGATION  1991   BILATERAL    Current Outpatient Medications  Medication Sig Dispense Refill   aspirin EC 81 MG EC tablet Take 1 tablet (81 mg total) by mouth daily. Swallow whole. 30 tablet 11   chlorthalidone (HYGROTON) 25 MG tablet Take 1 tablet (25 mg total) by mouth in the morning. 90 tablet 3   Cholecalciferol (VITAMIN D) 2000 units tablet Take 2,000 Units by mouth 2 (two) times daily.  DULoxetine (CYMBALTA) 30 MG capsule TAKE 1 CAPSULE BY MOUTH ONCE DAILY 90 capsule 3   Erenumab-aooe (AIMOVIG) 140 MG/ML SOAJ Inject 140 mg into the skin every 30 days. 3 mL 3   ezetimibe (ZETIA) 10 MG tablet Take 1 tablet (10 mg total) by mouth once a day. 90 tablet 3   levothyroxine (SYNTHROID) 75 MCG tablet Take 1 tablet (75 mcg total) by mouth daily for 6 days and 1/2 (one-half) a tablet by mouth for one day a week. 90 tablet 2   losartan (COZAAR) 100 MG tablet Take 1 tablet (100 mg total) by mouth daily. 90 tablet 3   Melatonin 5 MG  CAPS Take 5 mg by mouth at bedtime.     Multiple Vitamin (MULTIVITAMIN) capsule Take 1 capsule by mouth daily.     omeprazole (PRILOSEC) 40 MG capsule Take 1 capsule (40 mg total) by mouth 2 (two) times daily. 180 capsule 3   pramipexole (MIRAPEX) 0.125 MG tablet Take 1 tablet (0.125 mg total) by mouth 2 to 3 hours prior to bedtime. 30 tablet 6   rosuvastatin (CRESTOR) 20 MG tablet Take 1 tablet (20 mg total) by mouth daily. 90 tablet 3   vitamin B-12 (CYANOCOBALAMIN) 100 MCG tablet Take 100 mcg by mouth daily.     acyclovir (ZOVIRAX) 400 MG tablet Take 1 tablet (400 mg total) by mouth 2 (two) times daily. 180 tablet 4   DULoxetine (CYMBALTA) 30 MG capsule TAKE 1 CAPSULE BY MOUTH DAILY 90 capsule 2   gabapentin (NEURONTIN) 100 MG capsule Take 2 capsules (200 mg total) by mouth at bedtime. 180 capsule 3   No current facility-administered medications for this visit.    Family History  Problem Relation Age of Onset   Celiac disease Mother    Osteoporosis Mother    Heart disease Father    Stroke Father    Irritable bowel syndrome Sister    Lung cancer Maternal Grandmother    Breast cancer Paternal Grandmother    Heart disease Sister    Melanoma Sister    Colon cancer Neg Hx    Esophageal cancer Neg Hx    Stomach cancer Neg Hx    Rectal cancer Neg Hx     ROS: Constitutional: negative Genitourinary:negative  Exam:   BP 115/82 (BP Location: Right Arm, Patient Position: Sitting, Cuff Size: Large)   Pulse 84   Ht 5\' 4"  (1.626 m)   Wt 200 lb 3.2 oz (90.8 kg)   LMP 11/25/2005   BMI 34.36 kg/m   Height: 5\' 4"  (162.6 cm)  General appearance: alert, cooperative and appears stated age Head: Normocephalic, without obvious abnormality, atraumatic Neck: no adenopathy, supple, symmetrical, trachea midline and thyroid normal to inspection and palpation Lungs: clear to auscultation bilaterally Breasts: normal appearance, no masses or tenderness Heart: regular rate and rhythm Abdomen:  soft, non-tender; bowel sounds normal; no masses,  no organomegaly Extremities: extremities normal, atraumatic, no cyanosis or edema Skin: Skin color, texture, turgor normal. No rashes or lesions Lymph nodes: Cervical, supraclavicular, and axillary nodes normal. No abnormal inguinal nodes palpated Neurologic: Grossly normal   Pelvic: External genitalia:  no lesions              Urethra:  normal appearing urethra with no masses, tenderness or lesions              Bartholins and Skenes: normal                 Vagina: normal appearing vagina  with normal color and no discharge, no lesions              Cervix: absent              Pap taken: No. Bimanual Exam:  Uterus:  uterus absent              Adnexa: no mass, fullness, tenderness               Rectovaginal: Confirms               Anus:  normal sphincter tone, no lesions  Chaperone, Ina Homes, CMA, was present for exam.  Assessment/Plan: 1. Well woman exam with routine gynecological exam - Pap smear not indicated - Mammogram 10/2022 - Colonoscopy 11/16/20218 - Bone mineral density with Dr. Delena Serve - lab work done this week - vaccines reviewed/updated  2. Cerebral infarction due to embolism of right middle cerebral artery (HCC) - on baby ASA  3. HSV-1 infection - acyclovir (ZOVIRAX) 400 MG tablet; Take 1 tablet (400 mg total) by mouth 2 (two) times daily.  Dispense: 180 tablet; Refill: 4  4. Hot flashes - gabapentin (NEURONTIN) 100 MG capsule; Take 2 capsules (200 mg total) by mouth at bedtime.  Dispense: 180 capsule; Refill: 3  5. H/O: hysterectomy

## 2023-10-01 LAB — CUP PACEART REMOTE DEVICE CHECK: Date Time Interrogation Session: 20241103000115

## 2023-10-06 ENCOUNTER — Ambulatory Visit (INDEPENDENT_AMBULATORY_CARE_PROVIDER_SITE_OTHER): Payer: 59

## 2023-10-06 DIAGNOSIS — I63411 Cerebral infarction due to embolism of right middle cerebral artery: Secondary | ICD-10-CM

## 2023-11-03 NOTE — Progress Notes (Signed)
Carelink Summary Report / Loop Recorder 

## 2023-11-06 DIAGNOSIS — Z1231 Encounter for screening mammogram for malignant neoplasm of breast: Secondary | ICD-10-CM | POA: Diagnosis not present

## 2023-11-10 ENCOUNTER — Ambulatory Visit (INDEPENDENT_AMBULATORY_CARE_PROVIDER_SITE_OTHER): Payer: Commercial Managed Care - PPO

## 2023-11-10 DIAGNOSIS — I63411 Cerebral infarction due to embolism of right middle cerebral artery: Secondary | ICD-10-CM | POA: Diagnosis not present

## 2023-11-10 LAB — CUP PACEART REMOTE DEVICE CHECK: Date Time Interrogation Session: 20241216000316

## 2023-11-20 ENCOUNTER — Other Ambulatory Visit (HOSPITAL_COMMUNITY): Payer: Self-pay

## 2023-11-20 MED ORDER — DULOXETINE HCL 30 MG PO CPEP
30.0000 mg | ORAL_CAPSULE | Freq: Every day | ORAL | 3 refills | Status: DC
  Filled 2023-11-20: qty 90, 90d supply, fill #0
  Filled 2024-02-15: qty 90, 90d supply, fill #1
  Filled 2024-05-15: qty 90, 90d supply, fill #2
  Filled 2024-08-12: qty 90, 90d supply, fill #3

## 2023-11-21 ENCOUNTER — Other Ambulatory Visit (HOSPITAL_COMMUNITY): Payer: Self-pay

## 2023-11-24 ENCOUNTER — Other Ambulatory Visit: Payer: Self-pay

## 2023-11-24 ENCOUNTER — Other Ambulatory Visit (HOSPITAL_COMMUNITY): Payer: Self-pay

## 2023-11-25 ENCOUNTER — Other Ambulatory Visit (HOSPITAL_COMMUNITY): Payer: Self-pay

## 2023-11-27 ENCOUNTER — Other Ambulatory Visit (HOSPITAL_BASED_OUTPATIENT_CLINIC_OR_DEPARTMENT_OTHER): Payer: Self-pay

## 2023-11-27 ENCOUNTER — Other Ambulatory Visit (HOSPITAL_COMMUNITY): Payer: Self-pay

## 2023-11-27 MED ORDER — PRAMIPEXOLE DIHYDROCHLORIDE 0.125 MG PO TABS
0.1250 mg | ORAL_TABLET | Freq: Every day | ORAL | 1 refills | Status: DC
  Filled 2023-11-27: qty 90, 90d supply, fill #0
  Filled 2024-02-15 – 2024-02-21 (×4): qty 90, 90d supply, fill #1

## 2023-11-27 MED ORDER — EZETIMIBE 10 MG PO TABS
10.0000 mg | ORAL_TABLET | Freq: Every day | ORAL | 3 refills | Status: DC
  Filled 2023-11-27: qty 90, 90d supply, fill #0
  Filled 2024-02-20 – 2024-02-21 (×2): qty 90, 90d supply, fill #1
  Filled 2024-05-30: qty 90, 90d supply, fill #2
  Filled 2024-08-27: qty 90, 90d supply, fill #3

## 2023-11-28 ENCOUNTER — Other Ambulatory Visit (HOSPITAL_COMMUNITY): Payer: Self-pay

## 2023-11-29 ENCOUNTER — Other Ambulatory Visit (HOSPITAL_COMMUNITY): Payer: Self-pay

## 2023-12-04 ENCOUNTER — Encounter (HOSPITAL_BASED_OUTPATIENT_CLINIC_OR_DEPARTMENT_OTHER): Payer: Self-pay

## 2023-12-08 ENCOUNTER — Other Ambulatory Visit (HOSPITAL_COMMUNITY): Payer: Self-pay

## 2023-12-08 ENCOUNTER — Other Ambulatory Visit: Payer: Self-pay

## 2023-12-08 MED ORDER — LEVOTHYROXINE SODIUM 75 MCG PO TABS
ORAL_TABLET | ORAL | 2 refills | Status: DC
  Filled 2023-12-08: qty 90, 84d supply, fill #0
  Filled 2023-12-08: qty 90, 90d supply, fill #0
  Filled 2023-12-08: qty 90, 84d supply, fill #0
  Filled 2024-03-06: qty 90, 90d supply, fill #1
  Filled 2024-06-12 (×2): qty 90, 90d supply, fill #2

## 2023-12-13 ENCOUNTER — Other Ambulatory Visit (HOSPITAL_COMMUNITY): Payer: Self-pay

## 2023-12-15 ENCOUNTER — Ambulatory Visit (INDEPENDENT_AMBULATORY_CARE_PROVIDER_SITE_OTHER): Payer: 59

## 2023-12-15 DIAGNOSIS — I63411 Cerebral infarction due to embolism of right middle cerebral artery: Secondary | ICD-10-CM

## 2023-12-15 LAB — CUP PACEART REMOTE DEVICE CHECK: Date Time Interrogation Session: 20250120000120

## 2023-12-17 NOTE — Progress Notes (Signed)
Carelink Summary Report / Loop Recorder 

## 2023-12-21 ENCOUNTER — Other Ambulatory Visit (HOSPITAL_COMMUNITY): Payer: Self-pay

## 2023-12-22 ENCOUNTER — Other Ambulatory Visit (HOSPITAL_COMMUNITY): Payer: Self-pay

## 2023-12-22 MED ORDER — LOSARTAN POTASSIUM 100 MG PO TABS
100.0000 mg | ORAL_TABLET | Freq: Every day | ORAL | 3 refills | Status: AC
  Filled 2023-12-22: qty 90, 90d supply, fill #0
  Filled 2024-03-20: qty 90, 90d supply, fill #1
  Filled 2024-08-12: qty 90, 90d supply, fill #2
  Filled 2024-11-13: qty 90, 90d supply, fill #3

## 2024-01-14 ENCOUNTER — Other Ambulatory Visit (HOSPITAL_COMMUNITY): Payer: Self-pay

## 2024-01-17 ENCOUNTER — Other Ambulatory Visit (HOSPITAL_COMMUNITY): Payer: Self-pay

## 2024-01-19 ENCOUNTER — Ambulatory Visit (INDEPENDENT_AMBULATORY_CARE_PROVIDER_SITE_OTHER): Payer: 59

## 2024-01-19 ENCOUNTER — Other Ambulatory Visit (HOSPITAL_COMMUNITY): Payer: Self-pay

## 2024-01-19 DIAGNOSIS — I63411 Cerebral infarction due to embolism of right middle cerebral artery: Secondary | ICD-10-CM | POA: Diagnosis not present

## 2024-01-21 LAB — CUP PACEART REMOTE DEVICE CHECK: Date Time Interrogation Session: 20250224000018

## 2024-01-22 NOTE — Progress Notes (Signed)
 [facility] Summary Report / Loop Recorder

## 2024-01-26 ENCOUNTER — Other Ambulatory Visit (HOSPITAL_COMMUNITY): Payer: Self-pay

## 2024-02-10 ENCOUNTER — Other Ambulatory Visit (HOSPITAL_COMMUNITY): Payer: Self-pay

## 2024-02-16 ENCOUNTER — Other Ambulatory Visit (HOSPITAL_COMMUNITY): Payer: Self-pay

## 2024-02-16 ENCOUNTER — Other Ambulatory Visit: Payer: Self-pay

## 2024-02-17 ENCOUNTER — Other Ambulatory Visit (HOSPITAL_COMMUNITY): Payer: Self-pay

## 2024-02-18 ENCOUNTER — Other Ambulatory Visit (HOSPITAL_COMMUNITY): Payer: Self-pay

## 2024-02-20 ENCOUNTER — Other Ambulatory Visit (HOSPITAL_COMMUNITY): Payer: Self-pay

## 2024-02-21 ENCOUNTER — Other Ambulatory Visit (HOSPITAL_COMMUNITY): Payer: Self-pay

## 2024-02-23 ENCOUNTER — Ambulatory Visit (INDEPENDENT_AMBULATORY_CARE_PROVIDER_SITE_OTHER): Payer: 59

## 2024-02-23 DIAGNOSIS — I63411 Cerebral infarction due to embolism of right middle cerebral artery: Secondary | ICD-10-CM | POA: Diagnosis not present

## 2024-02-23 LAB — CUP PACEART REMOTE DEVICE CHECK: Date Time Interrogation Session: 20250331000035

## 2024-02-25 NOTE — Addendum Note (Signed)
 Addended by: Geralyn Flash D on: 02/25/2024 01:19 PM   Modules accepted: Orders

## 2024-02-25 NOTE — Progress Notes (Signed)
 [facility] Summary Report / Loop Recorder

## 2024-03-08 ENCOUNTER — Other Ambulatory Visit: Payer: Self-pay

## 2024-03-10 ENCOUNTER — Other Ambulatory Visit (HOSPITAL_COMMUNITY): Payer: Self-pay

## 2024-03-15 ENCOUNTER — Other Ambulatory Visit (HOSPITAL_COMMUNITY): Payer: Self-pay

## 2024-03-23 ENCOUNTER — Other Ambulatory Visit (HOSPITAL_COMMUNITY): Payer: Self-pay

## 2024-03-29 ENCOUNTER — Ambulatory Visit (INDEPENDENT_AMBULATORY_CARE_PROVIDER_SITE_OTHER): Payer: 59

## 2024-03-29 DIAGNOSIS — I63411 Cerebral infarction due to embolism of right middle cerebral artery: Secondary | ICD-10-CM | POA: Diagnosis not present

## 2024-03-29 LAB — CUP PACEART REMOTE DEVICE CHECK: Date Time Interrogation Session: 20250505000401

## 2024-04-09 NOTE — Progress Notes (Signed)
 [facility] Summary Report / Loop Recorder

## 2024-04-15 ENCOUNTER — Other Ambulatory Visit (HOSPITAL_COMMUNITY): Payer: Self-pay

## 2024-04-15 MED ORDER — MOUNJARO 5 MG/0.5ML ~~LOC~~ SOAJ
5.0000 mg | SUBCUTANEOUS | 0 refills | Status: DC
  Filled 2024-04-15: qty 2, 28d supply, fill #0

## 2024-04-16 ENCOUNTER — Other Ambulatory Visit (HOSPITAL_COMMUNITY): Payer: Self-pay

## 2024-04-16 DIAGNOSIS — E039 Hypothyroidism, unspecified: Secondary | ICD-10-CM | POA: Diagnosis not present

## 2024-04-16 DIAGNOSIS — I2510 Atherosclerotic heart disease of native coronary artery without angina pectoris: Secondary | ICD-10-CM | POA: Diagnosis not present

## 2024-04-16 DIAGNOSIS — E118 Type 2 diabetes mellitus with unspecified complications: Secondary | ICD-10-CM | POA: Diagnosis not present

## 2024-04-16 DIAGNOSIS — I10 Essential (primary) hypertension: Secondary | ICD-10-CM | POA: Diagnosis not present

## 2024-04-16 DIAGNOSIS — E785 Hyperlipidemia, unspecified: Secondary | ICD-10-CM | POA: Diagnosis not present

## 2024-04-16 DIAGNOSIS — R0683 Snoring: Secondary | ICD-10-CM | POA: Diagnosis not present

## 2024-04-16 DIAGNOSIS — R197 Diarrhea, unspecified: Secondary | ICD-10-CM | POA: Diagnosis not present

## 2024-04-16 DIAGNOSIS — E669 Obesity, unspecified: Secondary | ICD-10-CM | POA: Diagnosis not present

## 2024-04-16 DIAGNOSIS — G2581 Restless legs syndrome: Secondary | ICD-10-CM | POA: Diagnosis not present

## 2024-04-16 DIAGNOSIS — K224 Dyskinesia of esophagus: Secondary | ICD-10-CM | POA: Diagnosis not present

## 2024-04-16 DIAGNOSIS — M797 Fibromyalgia: Secondary | ICD-10-CM | POA: Diagnosis not present

## 2024-04-29 ENCOUNTER — Ambulatory Visit (INDEPENDENT_AMBULATORY_CARE_PROVIDER_SITE_OTHER)

## 2024-04-29 DIAGNOSIS — I495 Sick sinus syndrome: Secondary | ICD-10-CM | POA: Diagnosis not present

## 2024-04-29 LAB — CUP PACEART REMOTE DEVICE CHECK: Date Time Interrogation Session: 20250605000233

## 2024-05-03 ENCOUNTER — Ambulatory Visit: Payer: 59

## 2024-05-07 ENCOUNTER — Ambulatory Visit: Payer: Self-pay | Admitting: Cardiology

## 2024-05-11 ENCOUNTER — Other Ambulatory Visit (HOSPITAL_COMMUNITY): Payer: Self-pay

## 2024-05-11 MED ORDER — AIMOVIG 140 MG/ML ~~LOC~~ SOAJ
140.0000 mg | SUBCUTANEOUS | 3 refills | Status: DC
  Filled 2024-05-11 – 2024-05-25 (×4): qty 3, 90d supply, fill #0
  Filled 2024-08-18: qty 3, 90d supply, fill #1
  Filled 2024-11-03: qty 3, 90d supply, fill #2

## 2024-05-11 MED ORDER — PRAMIPEXOLE DIHYDROCHLORIDE 0.125 MG PO TABS
0.1250 mg | ORAL_TABLET | Freq: Every day | ORAL | 1 refills | Status: DC
  Filled 2024-05-11 – 2024-05-13 (×2): qty 90, 90d supply, fill #0
  Filled 2024-08-06 – 2024-08-07 (×2): qty 90, 90d supply, fill #1

## 2024-05-12 ENCOUNTER — Other Ambulatory Visit (HOSPITAL_COMMUNITY): Payer: Self-pay

## 2024-05-12 MED ORDER — MOUNJARO 5 MG/0.5ML ~~LOC~~ SOAJ
5.0000 mg | SUBCUTANEOUS | 0 refills | Status: DC
  Filled 2024-05-12: qty 2, 28d supply, fill #0

## 2024-05-13 ENCOUNTER — Other Ambulatory Visit (HOSPITAL_COMMUNITY): Payer: Self-pay

## 2024-05-14 ENCOUNTER — Other Ambulatory Visit (HOSPITAL_COMMUNITY): Payer: Self-pay

## 2024-05-18 ENCOUNTER — Other Ambulatory Visit (HOSPITAL_COMMUNITY): Payer: Self-pay

## 2024-05-18 NOTE — Progress Notes (Signed)
 [facility] Summary Report / Loop Recorder

## 2024-05-19 ENCOUNTER — Other Ambulatory Visit (HOSPITAL_COMMUNITY): Payer: Self-pay

## 2024-05-20 ENCOUNTER — Other Ambulatory Visit (HOSPITAL_COMMUNITY): Payer: Self-pay

## 2024-05-25 ENCOUNTER — Other Ambulatory Visit (HOSPITAL_COMMUNITY): Payer: Self-pay

## 2024-05-31 ENCOUNTER — Ambulatory Visit (INDEPENDENT_AMBULATORY_CARE_PROVIDER_SITE_OTHER)

## 2024-05-31 DIAGNOSIS — I495 Sick sinus syndrome: Secondary | ICD-10-CM | POA: Diagnosis not present

## 2024-05-31 LAB — CUP PACEART REMOTE DEVICE CHECK: Date Time Interrogation Session: 20250707000501

## 2024-06-07 ENCOUNTER — Ambulatory Visit: Payer: 59

## 2024-06-10 ENCOUNTER — Ambulatory Visit: Payer: Self-pay | Admitting: Cardiology

## 2024-06-12 ENCOUNTER — Other Ambulatory Visit (HOSPITAL_COMMUNITY): Payer: Self-pay

## 2024-06-14 ENCOUNTER — Other Ambulatory Visit (HOSPITAL_COMMUNITY): Payer: Self-pay

## 2024-06-14 MED ORDER — LEVOTHYROXINE SODIUM 75 MCG PO TABS
ORAL_TABLET | ORAL | 2 refills | Status: AC
  Filled 2024-06-14: qty 90, 90d supply, fill #0
  Filled 2024-10-03: qty 84, 90d supply, fill #0
  Filled 2024-10-04: qty 90, 90d supply, fill #0
  Filled 2024-12-28: qty 90, 90d supply, fill #1

## 2024-06-14 MED ORDER — OMEPRAZOLE 40 MG PO CPDR
40.0000 mg | DELAYED_RELEASE_CAPSULE | Freq: Two times a day (BID) | ORAL | 3 refills | Status: AC
  Filled 2024-08-12 – 2024-08-27 (×3): qty 180, 90d supply, fill #0
  Filled 2024-12-04: qty 180, 90d supply, fill #1

## 2024-06-15 ENCOUNTER — Other Ambulatory Visit (HOSPITAL_COMMUNITY): Payer: Self-pay

## 2024-06-17 NOTE — Progress Notes (Signed)
 [facility] Summary Report / Loop Recorder

## 2024-06-25 ENCOUNTER — Other Ambulatory Visit: Payer: Self-pay

## 2024-06-25 ENCOUNTER — Other Ambulatory Visit (HOSPITAL_BASED_OUTPATIENT_CLINIC_OR_DEPARTMENT_OTHER): Payer: Self-pay

## 2024-06-25 ENCOUNTER — Other Ambulatory Visit (HOSPITAL_COMMUNITY): Payer: Self-pay

## 2024-06-26 ENCOUNTER — Other Ambulatory Visit (HOSPITAL_COMMUNITY): Payer: Self-pay

## 2024-06-28 ENCOUNTER — Other Ambulatory Visit (HOSPITAL_COMMUNITY): Payer: Self-pay

## 2024-06-28 ENCOUNTER — Other Ambulatory Visit: Payer: Self-pay

## 2024-06-28 MED ORDER — MOUNJARO 5 MG/0.5ML ~~LOC~~ SOAJ
5.0000 mg | SUBCUTANEOUS | 0 refills | Status: AC
  Filled 2024-06-28: qty 2, 28d supply, fill #0

## 2024-06-28 MED ORDER — MOUNJARO 5 MG/0.5ML ~~LOC~~ SOAJ: 5.0000 mg | SUBCUTANEOUS | 0 refills | Status: DC

## 2024-06-29 ENCOUNTER — Other Ambulatory Visit (HOSPITAL_COMMUNITY): Payer: Self-pay

## 2024-07-01 ENCOUNTER — Ambulatory Visit

## 2024-07-01 ENCOUNTER — Ambulatory Visit: Payer: Self-pay | Admitting: Cardiology

## 2024-07-01 ENCOUNTER — Other Ambulatory Visit: Payer: Self-pay

## 2024-07-01 DIAGNOSIS — I495 Sick sinus syndrome: Secondary | ICD-10-CM

## 2024-07-01 LAB — CUP PACEART REMOTE DEVICE CHECK: Date Time Interrogation Session: 20250807000607

## 2024-07-05 ENCOUNTER — Other Ambulatory Visit (HOSPITAL_COMMUNITY): Payer: Self-pay

## 2024-07-12 ENCOUNTER — Ambulatory Visit: Payer: 59

## 2024-07-16 ENCOUNTER — Other Ambulatory Visit (HOSPITAL_COMMUNITY): Payer: Self-pay

## 2024-07-17 ENCOUNTER — Other Ambulatory Visit (HOSPITAL_COMMUNITY): Payer: Self-pay

## 2024-07-17 ENCOUNTER — Other Ambulatory Visit: Payer: Self-pay

## 2024-07-17 MED ORDER — MOUNJARO 5 MG/0.5ML ~~LOC~~ SOAJ
5.0000 mg | SUBCUTANEOUS | 0 refills | Status: AC
  Filled 2024-07-17 – 2024-07-22 (×3): qty 2, 28d supply, fill #0

## 2024-07-21 ENCOUNTER — Other Ambulatory Visit (HOSPITAL_COMMUNITY): Payer: Self-pay

## 2024-07-22 ENCOUNTER — Other Ambulatory Visit (HOSPITAL_COMMUNITY): Payer: Self-pay

## 2024-08-02 ENCOUNTER — Ambulatory Visit: Payer: Self-pay | Admitting: Cardiology

## 2024-08-02 ENCOUNTER — Ambulatory Visit (INDEPENDENT_AMBULATORY_CARE_PROVIDER_SITE_OTHER)

## 2024-08-02 DIAGNOSIS — I495 Sick sinus syndrome: Secondary | ICD-10-CM | POA: Diagnosis not present

## 2024-08-02 LAB — CUP PACEART REMOTE DEVICE CHECK: Date Time Interrogation Session: 20250907001354

## 2024-08-06 ENCOUNTER — Encounter (HOSPITAL_BASED_OUTPATIENT_CLINIC_OR_DEPARTMENT_OTHER): Payer: Self-pay

## 2024-08-06 ENCOUNTER — Encounter (HOSPITAL_BASED_OUTPATIENT_CLINIC_OR_DEPARTMENT_OTHER): Payer: Self-pay | Admitting: Obstetrics & Gynecology

## 2024-08-07 ENCOUNTER — Other Ambulatory Visit (HOSPITAL_COMMUNITY): Payer: Self-pay

## 2024-08-09 ENCOUNTER — Other Ambulatory Visit (HOSPITAL_COMMUNITY): Payer: Self-pay

## 2024-08-09 MED ORDER — MOUNJARO 5 MG/0.5ML ~~LOC~~ SOAJ
5.0000 mg | SUBCUTANEOUS | 0 refills | Status: AC
  Filled 2024-08-09 – 2024-08-14 (×2): qty 2, 28d supply, fill #0

## 2024-08-12 ENCOUNTER — Ambulatory Visit

## 2024-08-12 ENCOUNTER — Other Ambulatory Visit (HOSPITAL_COMMUNITY): Payer: Self-pay

## 2024-08-12 ENCOUNTER — Other Ambulatory Visit: Payer: Self-pay

## 2024-08-12 MED ORDER — ROSUVASTATIN CALCIUM 20 MG PO TABS
20.0000 mg | ORAL_TABLET | Freq: Every day | ORAL | 3 refills | Status: AC
  Filled 2024-08-12: qty 90, 90d supply, fill #0
  Filled 2024-11-25: qty 90, 90d supply, fill #1

## 2024-08-12 NOTE — Progress Notes (Signed)
 Remote Loop Recorder Transmission

## 2024-08-14 ENCOUNTER — Other Ambulatory Visit (HOSPITAL_COMMUNITY): Payer: Self-pay

## 2024-08-18 ENCOUNTER — Other Ambulatory Visit: Payer: Self-pay

## 2024-08-18 ENCOUNTER — Other Ambulatory Visit (HOSPITAL_COMMUNITY): Payer: Self-pay

## 2024-08-21 NOTE — Progress Notes (Signed)
 Remote Loop Recorder Transmission

## 2024-08-23 ENCOUNTER — Encounter (HOSPITAL_COMMUNITY): Payer: Self-pay

## 2024-08-23 ENCOUNTER — Other Ambulatory Visit (HOSPITAL_COMMUNITY): Payer: Self-pay

## 2024-08-24 ENCOUNTER — Other Ambulatory Visit: Payer: Self-pay

## 2024-08-24 ENCOUNTER — Other Ambulatory Visit (HOSPITAL_COMMUNITY): Payer: Self-pay

## 2024-08-27 ENCOUNTER — Other Ambulatory Visit (HOSPITAL_COMMUNITY): Payer: Self-pay

## 2024-08-28 ENCOUNTER — Other Ambulatory Visit (HOSPITAL_COMMUNITY): Payer: Self-pay

## 2024-08-30 ENCOUNTER — Other Ambulatory Visit: Payer: Self-pay

## 2024-08-30 ENCOUNTER — Other Ambulatory Visit (HOSPITAL_COMMUNITY): Payer: Self-pay

## 2024-08-30 MED ORDER — CHLORTHALIDONE 25 MG PO TABS
25.0000 mg | ORAL_TABLET | Freq: Every morning | ORAL | 3 refills | Status: AC
  Filled 2024-08-30: qty 90, 90d supply, fill #0
  Filled 2024-11-25: qty 90, 90d supply, fill #1

## 2024-09-02 ENCOUNTER — Ambulatory Visit

## 2024-09-02 ENCOUNTER — Ambulatory Visit (INDEPENDENT_AMBULATORY_CARE_PROVIDER_SITE_OTHER)

## 2024-09-02 DIAGNOSIS — I495 Sick sinus syndrome: Secondary | ICD-10-CM

## 2024-09-02 LAB — CUP PACEART REMOTE DEVICE CHECK: Date Time Interrogation Session: 20251009000101

## 2024-09-05 ENCOUNTER — Ambulatory Visit: Payer: Self-pay | Admitting: Cardiology

## 2024-09-06 NOTE — Progress Notes (Signed)
 Remote Loop Recorder Transmission

## 2024-09-08 NOTE — Progress Notes (Signed)
 Remote Loop Recorder Transmission

## 2024-09-13 ENCOUNTER — Ambulatory Visit

## 2024-09-22 ENCOUNTER — Other Ambulatory Visit (HOSPITAL_COMMUNITY): Payer: Self-pay

## 2024-09-30 ENCOUNTER — Other Ambulatory Visit (HOSPITAL_COMMUNITY): Payer: Self-pay

## 2024-09-30 MED ORDER — MOUNJARO 7.5 MG/0.5ML ~~LOC~~ SOAJ
7.5000 mg | SUBCUTANEOUS | 0 refills | Status: AC
  Filled 2024-09-30 – 2024-10-01 (×3): qty 2, 28d supply, fill #0

## 2024-10-01 ENCOUNTER — Other Ambulatory Visit: Payer: Self-pay

## 2024-10-01 ENCOUNTER — Other Ambulatory Visit (HOSPITAL_COMMUNITY): Payer: Self-pay

## 2024-10-01 ENCOUNTER — Other Ambulatory Visit (HOSPITAL_BASED_OUTPATIENT_CLINIC_OR_DEPARTMENT_OTHER): Payer: Self-pay

## 2024-10-01 MED ORDER — MOUNJARO 7.5 MG/0.5ML ~~LOC~~ SOAJ
7.5000 mg | SUBCUTANEOUS | 0 refills | Status: AC
  Filled 2024-10-01 (×2): qty 2, 28d supply, fill #0

## 2024-10-01 MED ORDER — MOUNJARO 7.5 MG/0.5ML ~~LOC~~ SOAJ
7.5000 mg | SUBCUTANEOUS | 0 refills | Status: AC
  Filled 2024-10-01 – 2024-12-20 (×2): qty 2, 28d supply, fill #0

## 2024-10-03 ENCOUNTER — Ambulatory Visit (INDEPENDENT_AMBULATORY_CARE_PROVIDER_SITE_OTHER)

## 2024-10-03 DIAGNOSIS — I495 Sick sinus syndrome: Secondary | ICD-10-CM

## 2024-10-03 LAB — CUP PACEART REMOTE DEVICE CHECK: Date Time Interrogation Session: 20251109000120

## 2024-10-04 ENCOUNTER — Other Ambulatory Visit: Payer: Self-pay

## 2024-10-04 ENCOUNTER — Other Ambulatory Visit (HOSPITAL_COMMUNITY): Payer: Self-pay

## 2024-10-04 ENCOUNTER — Ambulatory Visit

## 2024-10-05 ENCOUNTER — Ambulatory Visit: Payer: Self-pay | Admitting: Cardiology

## 2024-10-07 NOTE — Progress Notes (Signed)
 Remote Loop Recorder Transmission

## 2024-10-08 ENCOUNTER — Ambulatory Visit (HOSPITAL_BASED_OUTPATIENT_CLINIC_OR_DEPARTMENT_OTHER): Payer: Commercial Managed Care - PPO | Admitting: Obstetrics & Gynecology

## 2024-10-14 ENCOUNTER — Ambulatory Visit

## 2024-10-16 ENCOUNTER — Other Ambulatory Visit (HOSPITAL_BASED_OUTPATIENT_CLINIC_OR_DEPARTMENT_OTHER): Payer: Self-pay | Admitting: Obstetrics & Gynecology

## 2024-10-16 DIAGNOSIS — R232 Flushing: Secondary | ICD-10-CM

## 2024-10-18 ENCOUNTER — Other Ambulatory Visit (HOSPITAL_COMMUNITY): Payer: Self-pay

## 2024-10-18 MED ORDER — GABAPENTIN 100 MG PO CAPS
200.0000 mg | ORAL_CAPSULE | Freq: Every day | ORAL | 0 refills | Status: DC
  Filled 2024-10-18: qty 180, 90d supply, fill #0

## 2024-10-22 ENCOUNTER — Other Ambulatory Visit (HOSPITAL_COMMUNITY): Payer: Self-pay

## 2024-10-22 MED ORDER — PRAMIPEXOLE DIHYDROCHLORIDE 0.125 MG PO TABS
ORAL_TABLET | ORAL | 1 refills | Status: AC
  Filled 2024-10-22: qty 90, 90d supply, fill #0

## 2024-10-26 ENCOUNTER — Ambulatory Visit (HOSPITAL_BASED_OUTPATIENT_CLINIC_OR_DEPARTMENT_OTHER): Admitting: Obstetrics and Gynecology

## 2024-10-26 ENCOUNTER — Encounter (HOSPITAL_BASED_OUTPATIENT_CLINIC_OR_DEPARTMENT_OTHER): Payer: Self-pay | Admitting: Obstetrics and Gynecology

## 2024-10-26 ENCOUNTER — Other Ambulatory Visit (HOSPITAL_COMMUNITY): Payer: Self-pay

## 2024-10-26 ENCOUNTER — Other Ambulatory Visit: Payer: Self-pay

## 2024-10-26 VITALS — BP 107/70 | HR 79 | Ht 64.0 in | Wt 176.8 lb

## 2024-10-26 DIAGNOSIS — R232 Flushing: Secondary | ICD-10-CM

## 2024-10-26 DIAGNOSIS — Z1331 Encounter for screening for depression: Secondary | ICD-10-CM | POA: Diagnosis not present

## 2024-10-26 DIAGNOSIS — I63411 Cerebral infarction due to embolism of right middle cerebral artery: Secondary | ICD-10-CM

## 2024-10-26 DIAGNOSIS — Z90710 Acquired absence of both cervix and uterus: Secondary | ICD-10-CM

## 2024-10-26 DIAGNOSIS — Z01419 Encounter for gynecological examination (general) (routine) without abnormal findings: Secondary | ICD-10-CM

## 2024-10-26 DIAGNOSIS — B009 Herpesviral infection, unspecified: Secondary | ICD-10-CM | POA: Diagnosis not present

## 2024-10-26 MED ORDER — GABAPENTIN 100 MG PO CAPS
200.0000 mg | ORAL_CAPSULE | Freq: Every day | ORAL | 3 refills | Status: AC
  Filled 2024-10-26: qty 180, 90d supply, fill #0

## 2024-10-26 MED ORDER — ACYCLOVIR 400 MG PO TABS
400.0000 mg | ORAL_TABLET | Freq: Two times a day (BID) | ORAL | 7 refills | Status: AC
  Filled 2024-10-26: qty 90, 45d supply, fill #0
  Filled 2024-12-15: qty 180, 90d supply, fill #1

## 2024-10-26 NOTE — Progress Notes (Deleted)
 error

## 2024-10-26 NOTE — Progress Notes (Signed)
 ANNUAL EXAM Patient name: [name] MRN [id number]  Date of birth: 1961-11-21 Chief Complaint:   Gynecologic Exam  History of Present Illness:   [name] is a 63 y.o. G69P3003 Caucasian female being seen today for a routine annual exam.  Current complaints: annual exam  Patient's last menstrual period was 11/25/2005.   Last pap 04/10/2017. Results were: NILM w/ HRHPV not done.  Last mammogram: 11/06/2023 Negative. Results were: normal. Family h/o breast cancer: yes Paternal Grandmother Last colonoscopy: 10/10/2017. Results were: normal. Family h/o colorectal cancer: no Dr. Shayne does bone density     10/26/2024    8:33 AM 09/26/2023   10:04 AM 06/21/2022    9:50 AM 06/13/2021    9:21 AM  Depression screen PHQ 2/9  Decreased Interest 1 0 0 0  Down, Depressed, Hopeless 0 0 0 0  PHQ - 2 Score 1 0 0 0  Altered sleeping 0     Tired, decreased energy 0     Change in appetite 0     Feeling bad or failure about yourself  0     Trouble concentrating 0     Moving slowly or fidgety/restless 0     Suicidal thoughts 0     PHQ-9 Score 1     Difficult doing work/chores Not difficult at all           10/26/2024    8:33 AM  GAD 7 : Generalized Anxiety Score  Nervous, Anxious, on Edge 0  Control/stop worrying 0  Worry too much - different things 1  Trouble relaxing 0  Restless 1  Easily annoyed or irritable 0  Afraid - awful might happen 0  Total GAD 7 Score 2  Anxiety Difficulty Not difficult at all     Review of Systems:   Pertinent items are noted in HPI Denies any headaches, blurred vision, fatigue, shortness of breath, chest pain, abdominal pain, abnormal vaginal discharge/itching/odor/irritation, problems with periods, bowel movements, urination, or intercourse unless otherwise stated above. Pertinent History Reviewed:  Reviewed past medical,surgical, social and family history.  Reviewed problem list, medications and allergies. Physical Assessment:    Vitals:   10/26/24 0823  BP: 107/70  Pulse: 79  Weight: 176 lb 12.8 oz (80.2 kg)  Height: 5' 4 (1.626 m)  Body mass index is 30.35 kg/m.        Physical Examination:    General appearance - well appearing, and in no distress             Mental status - alert, oriented              Psych:  She has a normal mood and affect             Skin - warm and dry, normal color             Chest - effort normal             Neck:  midline trachea             Breasts - breasts appear normal, no suspicious masses, no skin or nipple changes or     axillary nodes             Abdomen - soft, nontender, nondistended             Pelvic - VULVA: normal appearing vulva with no masses, tenderness or lesions   VAGINA: normal appearing vagina with normal color and discharge, no lesions  CERVIX: absent             Extremities:  No swelling or varicosities noted Chaperone present for exam  No results found for this or any previous visit (from the past 24 hours).  Assessment & Plan:  1. Encounter for annual routine gynecological examination (Primary) Encouraged to schedule Mammogram, due in December Paps discontinued UTD on colonscopy   2. Cerebral infarction due to embolism of right middle cerebral artery (HCC)  3. H/O: hysterectomy   4. Hot flashes Refills sent - gabapentin  (NEURONTIN ) 100 MG capsule; Take 2 capsules (200 mg total) by mouth at bedtime.  Dispense: 180 capsule; Refill: 3  5. HSV-1 infection Refills sent  - acyclovir  (ZOVIRAX ) 400 MG tablet; Take 1 tablet (400 mg total) by mouth 2 (two) times daily.  Dispense: 90 tablet; Refill: 7   Labs/procedures today:   Mammogram: schedule in December , or sooner if problems Colonoscopy: per GI, or sooner if problems  No orders of the defined types were placed in this encounter.   Meds:  Meds ordered this encounter  Medications   acyclovir  (ZOVIRAX ) 400 MG tablet    Sig: Take 1 tablet (400 mg total) by mouth 2 (two) times  daily.    Dispense:  90 tablet    Refill:  7   gabapentin  (NEURONTIN ) 100 MG capsule    Sig: Take 2 capsules (200 mg total) by mouth at bedtime.    Dispense:  180 capsule    Refill:  3    Follow-up: Return in about 1 year (around 10/26/2025) for [name] Brooke Delores, FNP

## 2024-11-01 ENCOUNTER — Other Ambulatory Visit (HOSPITAL_COMMUNITY): Payer: Self-pay

## 2024-11-01 MED ORDER — MOUNJARO 7.5 MG/0.5ML ~~LOC~~ SOAJ
SUBCUTANEOUS | 0 refills | Status: AC
  Filled 2024-11-01: qty 2, 28d supply, fill #0

## 2024-11-03 ENCOUNTER — Ambulatory Visit

## 2024-11-03 ENCOUNTER — Other Ambulatory Visit (HOSPITAL_COMMUNITY): Payer: Self-pay

## 2024-11-04 ENCOUNTER — Other Ambulatory Visit (HOSPITAL_COMMUNITY): Payer: Self-pay

## 2024-11-04 ENCOUNTER — Ambulatory Visit

## 2024-11-05 ENCOUNTER — Other Ambulatory Visit (HOSPITAL_COMMUNITY): Payer: Self-pay

## 2024-11-05 ENCOUNTER — Ambulatory Visit

## 2024-11-05 DIAGNOSIS — I495 Sick sinus syndrome: Secondary | ICD-10-CM | POA: Diagnosis not present

## 2024-11-06 LAB — CUP PACEART REMOTE DEVICE CHECK: Date Time Interrogation Session: 20251212000326

## 2024-11-11 DIAGNOSIS — Z1231 Encounter for screening mammogram for malignant neoplasm of breast: Secondary | ICD-10-CM | POA: Diagnosis not present

## 2024-11-12 NOTE — Progress Notes (Signed)
 Remote Loop Recorder Transmission

## 2024-11-13 ENCOUNTER — Other Ambulatory Visit (HOSPITAL_COMMUNITY): Payer: Self-pay

## 2024-11-15 ENCOUNTER — Ambulatory Visit

## 2024-11-15 ENCOUNTER — Other Ambulatory Visit: Payer: Self-pay

## 2024-11-16 ENCOUNTER — Other Ambulatory Visit: Payer: Self-pay

## 2024-11-16 ENCOUNTER — Encounter (HOSPITAL_BASED_OUTPATIENT_CLINIC_OR_DEPARTMENT_OTHER): Payer: Self-pay

## 2024-11-16 ENCOUNTER — Other Ambulatory Visit (HOSPITAL_COMMUNITY): Payer: Self-pay

## 2024-11-16 MED ORDER — DULOXETINE HCL 30 MG PO CPEP
30.0000 mg | ORAL_CAPSULE | Freq: Every day | ORAL | 0 refills | Status: AC
  Filled 2024-11-16 (×3): qty 90, 90d supply, fill #0

## 2024-11-22 ENCOUNTER — Ambulatory Visit: Payer: Self-pay | Admitting: Cardiology

## 2024-11-23 ENCOUNTER — Other Ambulatory Visit (HOSPITAL_COMMUNITY): Payer: Self-pay

## 2024-11-23 MED ORDER — MOUNJARO 7.5 MG/0.5ML ~~LOC~~ SOAJ
7.5000 mg | SUBCUTANEOUS | 0 refills | Status: AC
  Filled 2024-11-23 – 2024-11-24 (×2): qty 2, 28d supply, fill #0

## 2024-11-24 ENCOUNTER — Other Ambulatory Visit (HOSPITAL_COMMUNITY): Payer: Self-pay

## 2024-11-24 ENCOUNTER — Other Ambulatory Visit: Payer: Self-pay

## 2024-11-25 ENCOUNTER — Other Ambulatory Visit: Payer: Self-pay

## 2024-11-25 ENCOUNTER — Other Ambulatory Visit (HOSPITAL_COMMUNITY): Payer: Self-pay

## 2024-11-26 ENCOUNTER — Encounter (HOSPITAL_COMMUNITY): Payer: Self-pay | Admitting: Pharmacist

## 2024-11-26 ENCOUNTER — Other Ambulatory Visit (HOSPITAL_COMMUNITY): Payer: Self-pay

## 2024-11-26 MED ORDER — EZETIMIBE 10 MG PO TABS
10.0000 mg | ORAL_TABLET | Freq: Every day | ORAL | 3 refills | Status: AC
  Filled 2024-11-26: qty 90, 90d supply, fill #0

## 2024-12-04 ENCOUNTER — Ambulatory Visit

## 2024-12-04 ENCOUNTER — Other Ambulatory Visit: Payer: Self-pay

## 2024-12-06 ENCOUNTER — Ambulatory Visit

## 2024-12-06 DIAGNOSIS — I495 Sick sinus syndrome: Secondary | ICD-10-CM | POA: Diagnosis not present

## 2024-12-06 LAB — CUP PACEART REMOTE DEVICE CHECK: Date Time Interrogation Session: 20260112000030

## 2024-12-07 ENCOUNTER — Ambulatory Visit: Payer: Self-pay | Admitting: Cardiology

## 2024-12-10 ENCOUNTER — Other Ambulatory Visit (HOSPITAL_COMMUNITY): Payer: Self-pay

## 2024-12-10 MED ORDER — NITROFURANTOIN MONOHYD MACRO 100 MG PO CAPS
100.0000 mg | ORAL_CAPSULE | Freq: Two times a day (BID) | ORAL | 0 refills | Status: AC
  Filled 2024-12-10: qty 14, 7d supply, fill #0

## 2024-12-10 NOTE — Progress Notes (Signed)
 Remote Loop Recorder Transmission

## 2024-12-15 ENCOUNTER — Other Ambulatory Visit: Payer: Self-pay

## 2024-12-16 ENCOUNTER — Ambulatory Visit

## 2024-12-20 ENCOUNTER — Other Ambulatory Visit (HOSPITAL_COMMUNITY): Payer: Self-pay

## 2024-12-20 ENCOUNTER — Other Ambulatory Visit: Payer: Self-pay

## 2024-12-29 ENCOUNTER — Other Ambulatory Visit: Payer: Self-pay

## 2024-12-30 ENCOUNTER — Other Ambulatory Visit: Payer: Self-pay

## 2025-01-04 ENCOUNTER — Ambulatory Visit

## 2025-01-06 ENCOUNTER — Ambulatory Visit

## 2025-01-17 ENCOUNTER — Ambulatory Visit

## 2025-01-26 ENCOUNTER — Ambulatory Visit (HOSPITAL_BASED_OUTPATIENT_CLINIC_OR_DEPARTMENT_OTHER): Admitting: Obstetrics & Gynecology

## 2025-02-04 ENCOUNTER — Ambulatory Visit

## 2025-02-06 ENCOUNTER — Ambulatory Visit

## 2025-03-07 ENCOUNTER — Ambulatory Visit

## 2025-03-09 ENCOUNTER — Ambulatory Visit
